# Patient Record
Sex: Male | Born: 1955 | Race: White | Hispanic: No | Marital: Married | Smoking: Former smoker
Health system: Southern US, Community
[De-identification: ages and names within clinical notes are randomized; demographics above are authoritative.]

## PROBLEM LIST (undated history)

## (undated) DIAGNOSIS — I10 Essential (primary) hypertension: Secondary | ICD-10-CM

## (undated) DIAGNOSIS — R109 Unspecified abdominal pain: Secondary | ICD-10-CM

## (undated) DIAGNOSIS — I2510 Atherosclerotic heart disease of native coronary artery without angina pectoris: Secondary | ICD-10-CM

## (undated) DIAGNOSIS — K5090 Crohn's disease, unspecified, without complications: Secondary | ICD-10-CM

## (undated) DIAGNOSIS — N289 Disorder of kidney and ureter, unspecified: Secondary | ICD-10-CM

## (undated) DIAGNOSIS — N210 Calculus in bladder: Secondary | ICD-10-CM

## (undated) DIAGNOSIS — R519 Headache, unspecified: Secondary | ICD-10-CM

## (undated) DIAGNOSIS — M1990 Unspecified osteoarthritis, unspecified site: Secondary | ICD-10-CM

## (undated) DIAGNOSIS — E785 Hyperlipidemia, unspecified: Secondary | ICD-10-CM

## (undated) DIAGNOSIS — R51 Headache: Secondary | ICD-10-CM

## (undated) HISTORY — DX: Essential (primary) hypertension: I10

## (undated) HISTORY — DX: Atherosclerotic heart disease of native coronary artery without angina pectoris: I25.10

## (undated) HISTORY — PX: BACK SURGERY: SHX140

## (undated) HISTORY — DX: Headache: R51

## (undated) HISTORY — PX: CORONARY STENT PLACEMENT: SHX1402

## (undated) HISTORY — PX: ESOPHAGOGASTRODUODENOSCOPY ENDOSCOPY: SHX5814

## (undated) HISTORY — DX: Hyperlipidemia, unspecified: E78.5

## (undated) HISTORY — DX: Headache, unspecified: R51.9

## (undated) HISTORY — PX: KNEE ARTHROSCOPY: SUR90

## (undated) HISTORY — PX: CHOLECYSTECTOMY: SHX55

---

## 1999-11-10 ENCOUNTER — Encounter: Payer: Self-pay | Admitting: Neurological Surgery

## 1999-11-11 ENCOUNTER — Encounter (INDEPENDENT_AMBULATORY_CARE_PROVIDER_SITE_OTHER): Payer: Self-pay | Admitting: *Deleted

## 1999-11-11 ENCOUNTER — Ambulatory Visit (HOSPITAL_COMMUNITY): Admission: RE | Admit: 1999-11-11 | Discharge: 1999-11-11 | Payer: Self-pay | Admitting: Gastroenterology

## 1999-12-02 ENCOUNTER — Ambulatory Visit (HOSPITAL_COMMUNITY): Admission: RE | Admit: 1999-12-02 | Discharge: 1999-12-02 | Payer: Self-pay | Admitting: Neurological Surgery

## 1999-12-02 ENCOUNTER — Encounter: Payer: Self-pay | Admitting: Neurological Surgery

## 1999-12-31 ENCOUNTER — Emergency Department (HOSPITAL_COMMUNITY): Admission: EM | Admit: 1999-12-31 | Discharge: 1999-12-31 | Payer: Self-pay | Admitting: Emergency Medicine

## 2000-01-12 ENCOUNTER — Encounter: Payer: Self-pay | Admitting: Neurological Surgery

## 2000-01-12 ENCOUNTER — Ambulatory Visit (HOSPITAL_COMMUNITY): Admission: RE | Admit: 2000-01-12 | Discharge: 2000-01-12 | Payer: Self-pay | Admitting: Neurological Surgery

## 2000-01-17 ENCOUNTER — Encounter: Payer: Self-pay | Admitting: Neurological Surgery

## 2000-01-17 ENCOUNTER — Ambulatory Visit (HOSPITAL_COMMUNITY): Admission: RE | Admit: 2000-01-17 | Discharge: 2000-01-17 | Payer: Self-pay | Admitting: Neurological Surgery

## 2000-09-15 ENCOUNTER — Encounter: Admission: RE | Admit: 2000-09-15 | Discharge: 2000-09-15 | Payer: Self-pay | Admitting: *Deleted

## 2000-09-15 ENCOUNTER — Encounter: Payer: Self-pay | Admitting: Nurse Practitioner

## 2000-11-12 ENCOUNTER — Ambulatory Visit (HOSPITAL_COMMUNITY): Admission: RE | Admit: 2000-11-12 | Discharge: 2000-11-12 | Payer: Self-pay | Admitting: Neurological Surgery

## 2000-11-12 ENCOUNTER — Encounter: Payer: Self-pay | Admitting: Neurological Surgery

## 2000-12-07 ENCOUNTER — Encounter: Payer: Self-pay | Admitting: Neurological Surgery

## 2000-12-07 ENCOUNTER — Inpatient Hospital Stay (HOSPITAL_COMMUNITY): Admission: RE | Admit: 2000-12-07 | Discharge: 2000-12-10 | Payer: Self-pay | Admitting: Neurological Surgery

## 2000-12-27 ENCOUNTER — Encounter: Admission: RE | Admit: 2000-12-27 | Discharge: 2000-12-27 | Payer: Self-pay | Admitting: Neurological Surgery

## 2000-12-27 ENCOUNTER — Encounter: Payer: Self-pay | Admitting: Neurological Surgery

## 2001-01-22 ENCOUNTER — Encounter: Admission: RE | Admit: 2001-01-22 | Discharge: 2001-01-22 | Payer: Self-pay | Admitting: Neurological Surgery

## 2001-01-22 ENCOUNTER — Encounter: Payer: Self-pay | Admitting: Neurological Surgery

## 2001-03-07 ENCOUNTER — Encounter: Admission: RE | Admit: 2001-03-07 | Discharge: 2001-03-07 | Payer: Self-pay | Admitting: Neurological Surgery

## 2001-03-07 ENCOUNTER — Encounter: Payer: Self-pay | Admitting: Neurological Surgery

## 2001-03-12 ENCOUNTER — Encounter: Admission: RE | Admit: 2001-03-12 | Discharge: 2001-05-02 | Payer: Self-pay | Admitting: Neurological Surgery

## 2001-03-13 ENCOUNTER — Encounter: Admission: RE | Admit: 2001-03-13 | Discharge: 2001-03-13 | Payer: Self-pay | Admitting: Urology

## 2001-03-13 ENCOUNTER — Encounter: Payer: Self-pay | Admitting: Urology

## 2001-03-30 ENCOUNTER — Other Ambulatory Visit: Admission: RE | Admit: 2001-03-30 | Discharge: 2001-03-30 | Payer: Self-pay | Admitting: Urology

## 2001-04-03 ENCOUNTER — Encounter: Admission: RE | Admit: 2001-04-03 | Discharge: 2001-06-08 | Payer: Self-pay | Admitting: Neurological Surgery

## 2001-04-13 ENCOUNTER — Other Ambulatory Visit: Admission: RE | Admit: 2001-04-13 | Discharge: 2001-04-13 | Payer: Self-pay | Admitting: Urology

## 2001-05-01 ENCOUNTER — Encounter: Payer: Self-pay | Admitting: Urology

## 2001-05-01 ENCOUNTER — Encounter (INDEPENDENT_AMBULATORY_CARE_PROVIDER_SITE_OTHER): Payer: Self-pay

## 2001-05-01 ENCOUNTER — Ambulatory Visit (HOSPITAL_COMMUNITY): Admission: RE | Admit: 2001-05-01 | Discharge: 2001-05-01 | Payer: Self-pay | Admitting: Urology

## 2001-05-03 ENCOUNTER — Encounter: Admission: RE | Admit: 2001-05-03 | Discharge: 2001-06-08 | Payer: Self-pay | Admitting: Neurological Surgery

## 2001-07-31 ENCOUNTER — Encounter: Payer: Self-pay | Admitting: Neurological Surgery

## 2001-07-31 ENCOUNTER — Ambulatory Visit (HOSPITAL_COMMUNITY): Admission: RE | Admit: 2001-07-31 | Discharge: 2001-07-31 | Payer: Self-pay | Admitting: Neurological Surgery

## 2001-09-14 ENCOUNTER — Encounter: Admission: RE | Admit: 2001-09-14 | Discharge: 2001-09-14 | Payer: Self-pay | Admitting: Family Medicine

## 2001-09-14 ENCOUNTER — Encounter: Payer: Self-pay | Admitting: Family Medicine

## 2003-01-20 ENCOUNTER — Inpatient Hospital Stay (HOSPITAL_COMMUNITY): Admission: RE | Admit: 2003-01-20 | Discharge: 2003-01-21 | Payer: Self-pay | Admitting: Neurological Surgery

## 2003-01-20 ENCOUNTER — Encounter: Payer: Self-pay | Admitting: Neurological Surgery

## 2003-09-04 ENCOUNTER — Encounter: Payer: Self-pay | Admitting: Nurse Practitioner

## 2003-10-14 ENCOUNTER — Encounter: Admission: RE | Admit: 2003-10-14 | Discharge: 2003-10-14 | Payer: Self-pay | Admitting: Neurological Surgery

## 2003-11-07 ENCOUNTER — Ambulatory Visit (HOSPITAL_COMMUNITY): Admission: RE | Admit: 2003-11-07 | Discharge: 2003-11-07 | Payer: Self-pay | Admitting: Neurological Surgery

## 2003-11-13 ENCOUNTER — Inpatient Hospital Stay (HOSPITAL_COMMUNITY): Admission: RE | Admit: 2003-11-13 | Discharge: 2003-11-20 | Payer: Self-pay | Admitting: Neurological Surgery

## 2003-11-28 ENCOUNTER — Inpatient Hospital Stay (HOSPITAL_COMMUNITY): Admission: AD | Admit: 2003-11-28 | Discharge: 2003-12-02 | Payer: Self-pay | Admitting: Neurological Surgery

## 2003-12-26 ENCOUNTER — Inpatient Hospital Stay (HOSPITAL_COMMUNITY): Admission: EM | Admit: 2003-12-26 | Discharge: 2003-12-27 | Payer: Self-pay | Admitting: Emergency Medicine

## 2003-12-26 ENCOUNTER — Encounter (INDEPENDENT_AMBULATORY_CARE_PROVIDER_SITE_OTHER): Payer: Self-pay | Admitting: *Deleted

## 2004-02-17 ENCOUNTER — Encounter: Admission: RE | Admit: 2004-02-17 | Discharge: 2004-02-17 | Payer: Self-pay | Admitting: Neurological Surgery

## 2004-02-23 ENCOUNTER — Encounter (HOSPITAL_COMMUNITY): Admission: RE | Admit: 2004-02-23 | Discharge: 2004-03-24 | Payer: Self-pay | Admitting: Neurological Surgery

## 2004-03-05 ENCOUNTER — Ambulatory Visit (HOSPITAL_COMMUNITY): Admission: RE | Admit: 2004-03-05 | Discharge: 2004-03-05 | Payer: Self-pay | Admitting: Neurological Surgery

## 2004-03-11 ENCOUNTER — Inpatient Hospital Stay (HOSPITAL_COMMUNITY): Admission: RE | Admit: 2004-03-11 | Discharge: 2004-03-15 | Payer: Self-pay | Admitting: Neurological Surgery

## 2004-03-12 ENCOUNTER — Encounter (INDEPENDENT_AMBULATORY_CARE_PROVIDER_SITE_OTHER): Payer: Self-pay | Admitting: *Deleted

## 2004-03-29 ENCOUNTER — Encounter (INDEPENDENT_AMBULATORY_CARE_PROVIDER_SITE_OTHER): Payer: Self-pay | Admitting: *Deleted

## 2004-03-30 ENCOUNTER — Ambulatory Visit (HOSPITAL_COMMUNITY): Admission: RE | Admit: 2004-03-30 | Discharge: 2004-03-30 | Payer: Self-pay | Admitting: Gastroenterology

## 2004-04-16 ENCOUNTER — Encounter (HOSPITAL_COMMUNITY): Admission: RE | Admit: 2004-04-16 | Discharge: 2004-05-16 | Payer: Self-pay | Admitting: Neurological Surgery

## 2004-09-08 ENCOUNTER — Encounter: Payer: Self-pay | Admitting: Nurse Practitioner

## 2005-06-08 ENCOUNTER — Ambulatory Visit: Payer: Self-pay | Admitting: Gastroenterology

## 2005-06-21 ENCOUNTER — Ambulatory Visit: Payer: Self-pay | Admitting: Gastroenterology

## 2005-07-01 ENCOUNTER — Ambulatory Visit (HOSPITAL_COMMUNITY): Admission: RE | Admit: 2005-07-01 | Discharge: 2005-07-01 | Payer: Self-pay | Admitting: Neurological Surgery

## 2006-02-07 ENCOUNTER — Ambulatory Visit (HOSPITAL_COMMUNITY): Admission: RE | Admit: 2006-02-07 | Discharge: 2006-02-07 | Payer: Self-pay | Admitting: Neurological Surgery

## 2006-02-14 ENCOUNTER — Ambulatory Visit (HOSPITAL_COMMUNITY): Admission: RE | Admit: 2006-02-14 | Discharge: 2006-02-14 | Payer: Self-pay | Admitting: Neurological Surgery

## 2006-03-15 ENCOUNTER — Encounter: Admission: RE | Admit: 2006-03-15 | Discharge: 2006-03-15 | Payer: Self-pay | Admitting: Neurological Surgery

## 2006-03-22 ENCOUNTER — Encounter (HOSPITAL_COMMUNITY): Admission: RE | Admit: 2006-03-22 | Discharge: 2006-04-21 | Payer: Self-pay | Admitting: Neurological Surgery

## 2006-05-25 ENCOUNTER — Encounter: Admission: RE | Admit: 2006-05-25 | Discharge: 2006-05-25 | Payer: Self-pay | Admitting: Neurological Surgery

## 2007-01-05 ENCOUNTER — Ambulatory Visit (HOSPITAL_COMMUNITY): Admission: RE | Admit: 2007-01-05 | Discharge: 2007-01-05 | Payer: Self-pay | Admitting: Neurological Surgery

## 2007-08-05 ENCOUNTER — Emergency Department (HOSPITAL_COMMUNITY): Admission: EM | Admit: 2007-08-05 | Discharge: 2007-08-05 | Payer: Self-pay | Admitting: Emergency Medicine

## 2007-08-21 ENCOUNTER — Encounter: Payer: Self-pay | Admitting: Nurse Practitioner

## 2007-10-25 ENCOUNTER — Encounter: Admission: RE | Admit: 2007-10-25 | Discharge: 2007-10-25 | Payer: Self-pay | Admitting: Neurological Surgery

## 2008-05-15 ENCOUNTER — Encounter: Admission: RE | Admit: 2008-05-15 | Discharge: 2008-05-15 | Payer: Self-pay | Admitting: Neurological Surgery

## 2008-09-25 ENCOUNTER — Encounter: Payer: Self-pay | Admitting: Nurse Practitioner

## 2009-05-11 ENCOUNTER — Emergency Department (HOSPITAL_COMMUNITY): Admission: EM | Admit: 2009-05-11 | Discharge: 2009-05-12 | Payer: Self-pay | Admitting: Emergency Medicine

## 2009-05-12 ENCOUNTER — Telehealth: Payer: Self-pay | Admitting: Internal Medicine

## 2009-05-13 ENCOUNTER — Ambulatory Visit: Payer: Self-pay | Admitting: Gastroenterology

## 2009-05-13 DIAGNOSIS — D696 Thrombocytopenia, unspecified: Secondary | ICD-10-CM | POA: Insufficient documentation

## 2009-05-13 DIAGNOSIS — R110 Nausea: Secondary | ICD-10-CM | POA: Insufficient documentation

## 2009-05-13 DIAGNOSIS — N200 Calculus of kidney: Secondary | ICD-10-CM | POA: Insufficient documentation

## 2009-05-13 DIAGNOSIS — R1013 Epigastric pain: Secondary | ICD-10-CM | POA: Insufficient documentation

## 2009-05-14 ENCOUNTER — Ambulatory Visit (HOSPITAL_COMMUNITY): Admission: RE | Admit: 2009-05-14 | Discharge: 2009-05-14 | Payer: Self-pay | Admitting: Gastroenterology

## 2009-05-14 LAB — CONVERTED CEMR LAB
AST: 328 units/L — ABNORMAL HIGH (ref 0–37)
Total Bilirubin: 1.8 mg/dL — ABNORMAL HIGH (ref 0.3–1.2)

## 2009-05-15 ENCOUNTER — Encounter: Payer: Self-pay | Admitting: Nurse Practitioner

## 2009-05-15 ENCOUNTER — Encounter: Payer: Self-pay | Admitting: Gastroenterology

## 2009-05-15 DIAGNOSIS — K8050 Calculus of bile duct without cholangitis or cholecystitis without obstruction: Secondary | ICD-10-CM | POA: Insufficient documentation

## 2009-05-17 ENCOUNTER — Ambulatory Visit (HOSPITAL_COMMUNITY): Admission: RE | Admit: 2009-05-17 | Discharge: 2009-05-17 | Payer: Self-pay | Admitting: Gastroenterology

## 2009-05-18 ENCOUNTER — Ambulatory Visit: Payer: Self-pay | Admitting: Gastroenterology

## 2009-05-18 LAB — CONVERTED CEMR LAB
Basophils Relative: 0.1 % (ref 0.0–3.0)
Eosinophils Relative: 2.3 % (ref 0.0–5.0)
HCT: 39.5 % (ref 39.0–52.0)
Hemoglobin: 13.6 g/dL (ref 13.0–17.0)
Lipase: 12 units/L (ref 11.0–59.0)
Lymphs Abs: 1 10*3/uL (ref 0.7–4.0)
MCV: 94.5 fL (ref 78.0–100.0)
Monocytes Absolute: 0.3 10*3/uL (ref 0.1–1.0)
Monocytes Relative: 5.6 % (ref 3.0–12.0)
Neutro Abs: 4.5 10*3/uL (ref 1.4–7.7)
Platelets: 152 10*3/uL (ref 150.0–400.0)
Prothrombin Time: 10.9 s (ref 9.1–11.7)
WBC: 5.9 10*3/uL (ref 4.5–10.5)

## 2009-05-19 ENCOUNTER — Encounter: Payer: Self-pay | Admitting: Gastroenterology

## 2009-05-19 ENCOUNTER — Ambulatory Visit (HOSPITAL_COMMUNITY): Admission: RE | Admit: 2009-05-19 | Discharge: 2009-05-19 | Payer: Self-pay | Admitting: Gastroenterology

## 2009-05-19 ENCOUNTER — Telehealth: Payer: Self-pay | Admitting: Gastroenterology

## 2009-05-21 ENCOUNTER — Ambulatory Visit: Payer: Self-pay | Admitting: Gastroenterology

## 2009-05-25 ENCOUNTER — Telehealth: Payer: Self-pay | Admitting: Gastroenterology

## 2009-06-01 ENCOUNTER — Encounter: Payer: Self-pay | Admitting: Gastroenterology

## 2009-06-02 ENCOUNTER — Telehealth: Payer: Self-pay | Admitting: Gastroenterology

## 2009-06-03 ENCOUNTER — Telehealth: Payer: Self-pay | Admitting: Internal Medicine

## 2009-06-19 ENCOUNTER — Ambulatory Visit: Payer: Self-pay | Admitting: Internal Medicine

## 2009-06-22 LAB — CONVERTED CEMR LAB
ALT: 22 units/L (ref 0–53)
AST: 23 units/L (ref 0–37)
Alkaline Phosphatase: 59 units/L (ref 39–117)
Bilirubin, Direct: 0.1 mg/dL (ref 0.0–0.3)
Total Bilirubin: 1.1 mg/dL (ref 0.3–1.2)

## 2009-07-10 ENCOUNTER — Ambulatory Visit (HOSPITAL_BASED_OUTPATIENT_CLINIC_OR_DEPARTMENT_OTHER): Admission: RE | Admit: 2009-07-10 | Discharge: 2009-07-10 | Payer: Self-pay | Admitting: Urology

## 2009-08-10 ENCOUNTER — Ambulatory Visit: Payer: Self-pay | Admitting: Gastroenterology

## 2009-08-10 DIAGNOSIS — K219 Gastro-esophageal reflux disease without esophagitis: Secondary | ICD-10-CM | POA: Insufficient documentation

## 2009-08-10 DIAGNOSIS — Z860100 Personal history of colon polyps, unspecified: Secondary | ICD-10-CM | POA: Insufficient documentation

## 2009-08-10 DIAGNOSIS — R1011 Right upper quadrant pain: Secondary | ICD-10-CM | POA: Insufficient documentation

## 2009-08-10 DIAGNOSIS — R932 Abnormal findings on diagnostic imaging of liver and biliary tract: Secondary | ICD-10-CM | POA: Insufficient documentation

## 2009-08-11 ENCOUNTER — Ambulatory Visit: Payer: Self-pay | Admitting: Cardiology

## 2009-08-11 LAB — CONVERTED CEMR LAB
Alkaline Phosphatase: 66 units/L (ref 39–117)
BUN: 11 mg/dL (ref 6–23)
Basophils Relative: 0.2 % (ref 0.0–3.0)
Bilirubin Urine: NEGATIVE
CO2: 30 meq/L (ref 19–32)
Eosinophils Relative: 2 % (ref 0.0–5.0)
GFR calc non Af Amer: 73.97 mL/min (ref >60)
Glucose, Bld: 89 mg/dL (ref 70–99)
HCT: 44.1 % (ref 39.0–52.0)
Leukocytes, UA: NEGATIVE
Lipase: 20 units/L (ref 11.0–59.0)
Lymphs Abs: 1.4 10*3/uL (ref 0.7–4.0)
MCV: 93.8 fL (ref 78.0–100.0)
Monocytes Absolute: 0.4 10*3/uL (ref 0.1–1.0)
Monocytes Relative: 4.9 % (ref 3.0–12.0)
Neutrophils Relative %: 75 % (ref 43.0–77.0)
Nitrite: NEGATIVE
Platelets: 126 10*3/uL — ABNORMAL LOW (ref 150.0–400.0)
RBC: 4.71 M/uL (ref 4.22–5.81)
Specific Gravity, Urine: 1.01 (ref 1.000–1.030)
Total Bilirubin: 1 mg/dL (ref 0.3–1.2)
Total Protein, Urine: NEGATIVE mg/dL
Total Protein: 7.1 g/dL (ref 6.0–8.3)
WBC: 7.6 10*3/uL (ref 4.5–10.5)
pH: 6 (ref 5.0–8.0)

## 2009-09-14 ENCOUNTER — Encounter: Payer: Self-pay | Admitting: Gastroenterology

## 2009-11-09 ENCOUNTER — Telehealth: Payer: Self-pay | Admitting: Gastroenterology

## 2009-11-10 ENCOUNTER — Ambulatory Visit: Payer: Self-pay | Admitting: Gastroenterology

## 2009-11-11 LAB — CONVERTED CEMR LAB
Basophils Absolute: 0.1 10*3/uL (ref 0.0–0.1)
Eosinophils Relative: 2.3 % (ref 0.0–5.0)
Lymphocytes Relative: 26.8 % (ref 12.0–46.0)
Monocytes Relative: 8.6 % (ref 3.0–12.0)
Neutrophils Relative %: 61.1 % (ref 43.0–77.0)
Platelets: 151 10*3/uL (ref 150.0–400.0)
RDW: 13.7 % (ref 11.5–14.6)
WBC: 6.3 10*3/uL (ref 4.5–10.5)

## 2009-11-15 ENCOUNTER — Encounter: Admission: RE | Admit: 2009-11-15 | Discharge: 2009-11-15 | Payer: Self-pay | Admitting: Gastroenterology

## 2009-11-20 ENCOUNTER — Encounter: Payer: Self-pay | Admitting: Gastroenterology

## 2009-11-23 ENCOUNTER — Telehealth: Payer: Self-pay | Admitting: Gastroenterology

## 2009-11-24 ENCOUNTER — Ambulatory Visit (HOSPITAL_COMMUNITY): Admission: RE | Admit: 2009-11-24 | Discharge: 2009-11-24 | Payer: Self-pay | Admitting: Gastroenterology

## 2009-11-24 ENCOUNTER — Ambulatory Visit: Payer: Self-pay | Admitting: Gastroenterology

## 2009-11-25 ENCOUNTER — Encounter (INDEPENDENT_AMBULATORY_CARE_PROVIDER_SITE_OTHER): Payer: Self-pay | Admitting: *Deleted

## 2009-11-25 LAB — CONVERTED CEMR LAB
AST: 54 units/L — ABNORMAL HIGH (ref 0–37)
Alkaline Phosphatase: 116 units/L (ref 39–117)
Basophils Relative: 0 % (ref 0.0–3.0)
Bilirubin, Direct: 0.2 mg/dL (ref 0.0–0.3)
CO2: 31 meq/L (ref 19–32)
Calcium: 9.5 mg/dL (ref 8.4–10.5)
Chloride: 104 meq/L (ref 96–112)
Eosinophils Relative: 1.4 % (ref 0.0–5.0)
Glucose, Bld: 104 mg/dL — ABNORMAL HIGH (ref 70–99)
Hemoglobin: 13.8 g/dL (ref 13.0–17.0)
Ketones, ur: 40 mg/dL
Leukocytes, UA: NEGATIVE
Lipase: 17 units/L (ref 11.0–59.0)
Lymphocytes Relative: 13 % (ref 12.0–46.0)
MCHC: 33.7 g/dL (ref 30.0–36.0)
Monocytes Absolute: 0.5 10*3/uL (ref 0.1–1.0)
Nitrite: NEGATIVE
Platelets: 151 10*3/uL (ref 150.0–400.0)
Potassium: 4.2 meq/L (ref 3.5–5.1)
RDW: 13.5 % (ref 11.5–14.6)
Sodium: 141 meq/L (ref 135–145)
Specific Gravity, Urine: 1.02 (ref 1.000–1.030)
Total Protein: 7.1 g/dL (ref 6.0–8.3)
Urobilinogen, UA: 0.2 (ref 0.0–1.0)
pH: 6 (ref 5.0–8.0)

## 2009-11-27 ENCOUNTER — Ambulatory Visit: Payer: Self-pay | Admitting: Gastroenterology

## 2009-11-30 ENCOUNTER — Encounter: Payer: Self-pay | Admitting: Gastroenterology

## 2009-11-30 ENCOUNTER — Telehealth: Payer: Self-pay | Admitting: Gastroenterology

## 2009-12-09 ENCOUNTER — Telehealth (INDEPENDENT_AMBULATORY_CARE_PROVIDER_SITE_OTHER): Payer: Self-pay | Admitting: *Deleted

## 2009-12-11 ENCOUNTER — Encounter: Payer: Self-pay | Admitting: Gastroenterology

## 2010-01-05 ENCOUNTER — Telehealth: Payer: Self-pay | Admitting: Gastroenterology

## 2010-01-05 ENCOUNTER — Ambulatory Visit: Payer: Self-pay | Admitting: Gastroenterology

## 2010-01-06 LAB — CONVERTED CEMR LAB
Albumin: 4 g/dL (ref 3.5–5.2)
Alkaline Phosphatase: 67 units/L (ref 39–117)
Total Protein: 6.1 g/dL (ref 6.0–8.3)

## 2010-02-09 ENCOUNTER — Telehealth: Payer: Self-pay | Admitting: Gastroenterology

## 2010-03-08 ENCOUNTER — Ambulatory Visit: Payer: Self-pay | Admitting: Gastroenterology

## 2010-03-12 ENCOUNTER — Ambulatory Visit (HOSPITAL_COMMUNITY): Admission: RE | Admit: 2010-03-12 | Discharge: 2010-03-12 | Payer: Self-pay | Admitting: Neurological Surgery

## 2010-04-01 ENCOUNTER — Encounter: Admission: RE | Admit: 2010-04-01 | Discharge: 2010-04-01 | Payer: Self-pay | Admitting: Neurological Surgery

## 2010-05-13 ENCOUNTER — Encounter (INDEPENDENT_AMBULATORY_CARE_PROVIDER_SITE_OTHER): Payer: Self-pay | Admitting: *Deleted

## 2010-10-20 ENCOUNTER — Telehealth: Payer: Self-pay | Admitting: Gastroenterology

## 2010-10-24 ENCOUNTER — Encounter: Payer: Self-pay | Admitting: Neurological Surgery

## 2010-11-02 ENCOUNTER — Encounter: Payer: Self-pay | Admitting: Gastroenterology

## 2010-11-02 ENCOUNTER — Other Ambulatory Visit: Payer: Self-pay | Admitting: Gastroenterology

## 2010-11-02 ENCOUNTER — Ambulatory Visit
Admission: RE | Admit: 2010-11-02 | Discharge: 2010-11-02 | Payer: Self-pay | Source: Home / Self Care | Attending: Gastroenterology | Admitting: Gastroenterology

## 2010-11-02 DIAGNOSIS — R141 Gas pain: Secondary | ICD-10-CM | POA: Insufficient documentation

## 2010-11-02 DIAGNOSIS — K5900 Constipation, unspecified: Secondary | ICD-10-CM | POA: Insufficient documentation

## 2010-11-02 DIAGNOSIS — K649 Unspecified hemorrhoids: Secondary | ICD-10-CM | POA: Insufficient documentation

## 2010-11-02 DIAGNOSIS — R142 Eructation: Secondary | ICD-10-CM

## 2010-11-02 DIAGNOSIS — J328 Other chronic sinusitis: Secondary | ICD-10-CM | POA: Insufficient documentation

## 2010-11-02 DIAGNOSIS — R1319 Other dysphagia: Secondary | ICD-10-CM | POA: Insufficient documentation

## 2010-11-02 DIAGNOSIS — IMO0001 Reserved for inherently not codable concepts without codable children: Secondary | ICD-10-CM

## 2010-11-02 DIAGNOSIS — R143 Flatulence: Secondary | ICD-10-CM | POA: Insufficient documentation

## 2010-11-02 LAB — CBC WITH DIFFERENTIAL/PLATELET
Basophils Absolute: 0 10*3/uL (ref 0.0–0.1)
Basophils Relative: 0.2 % (ref 0.0–3.0)
Eosinophils Relative: 2.6 % (ref 0.0–5.0)
HCT: 43.6 % (ref 39.0–52.0)
Hemoglobin: 14.9 g/dL (ref 13.0–17.0)
Lymphocytes Relative: 23.2 % (ref 12.0–46.0)
Lymphs Abs: 1.8 10*3/uL (ref 0.7–4.0)
Monocytes Relative: 6.2 % (ref 3.0–12.0)
Neutro Abs: 5.2 10*3/uL (ref 1.4–7.7)
RBC: 4.84 Mil/uL (ref 4.22–5.81)
WBC: 7.6 10*3/uL (ref 4.5–10.5)

## 2010-11-02 LAB — BASIC METABOLIC PANEL
BUN: 13 mg/dL (ref 6–23)
Calcium: 9.4 mg/dL (ref 8.4–10.5)
Chloride: 104 mEq/L (ref 96–112)
Creatinine, Ser: 1.1 mg/dL (ref 0.4–1.5)

## 2010-11-02 LAB — HEPATIC FUNCTION PANEL
AST: 19 U/L (ref 0–37)
Albumin: 4.2 g/dL (ref 3.5–5.2)
Alkaline Phosphatase: 68 U/L (ref 39–117)
Bilirubin, Direct: 0.1 mg/dL (ref 0.0–0.3)
Total Protein: 6.8 g/dL (ref 6.0–8.3)

## 2010-11-02 LAB — LIPASE: Lipase: 32 U/L (ref 11.0–59.0)

## 2010-11-02 NOTE — Progress Notes (Signed)
Summary: Having same problem again  Phone Note Call from Patient Call back at Home Phone [phone]   Call For: Dr Russella Dar Reason for Call: Talk to Nurse Summary of Call: Is sure you will remember him. Says he is having the same problem again.  Initial call taken by: Leanor Kail [facility],  Feb 09, 2010 12:36 PM  Follow-up for Phone Call        Patient  reports Sunday pm about 8 pm severe pain returned with the sweats.  He took Maalox and this helped some he also took some hyomax.  He has been out of Carafate for about 1 week and glycopyrolate.  He reports that he now has a burning sensation in his esophagus and throat.  He has been taking pantoprazole two times a day.  Should he restart Carafate and Glycopyrolate.  He is not sure if it is just a coincidence that the pain returned.  Please advise Follow-up by: [name] Jones RN, CGRN,  Feb 09, 2010 1:16 PM  Additional Follow-up for Phone Call Additional follow up Details #1::        Restart Carafate and glycopyrrolate. Continue pantoprazole bid. Please have his take his temperature when he is hot or having sweats to check for a fever. Have him come in for bloodwork: CMET, CBC, lipase. Additional Follow-up by: Skylur Fuston T Johnavon Mcclafferty MD FACG,  Feb 09, 2010 3:01 PM    Additional Follow-up for Phone Call Additional follow up Details #2::    I have left the patient a message asking him to pick up RX at the pharmacy and start on it agin, he is also asked to take his temp when he has a hot episode, and come for labs today or in the am.  I have also asked him to call me back to confirm he got this message.  He was going to [facility] with his wife earlier when we spoke. Follow-up by: [name] Jones RN, CGRN,  Feb 09, 2010 3:17 PM  Additional Follow-up for Phone Call Additional follow up Details #3:: Details for Additional Follow-up Action Taken: I spoke with patient this am .  He will try to come for labs today Additional Follow-up by: [name] Jones RN, CGRN,   Feb 10, 2010 10:46 AM  Prescriptions: CARAFATE 1 GM  TABS (SUCRALFATE) by mouth three times a day  #90 x 3   Entered by:   [name] Jones RN, CGRN   Authorized by:   Marrian Bells T Yavonne Kiss MD FACG   Signed by:   [name] Jones RN, CGRN on 02/09/2010   Method used:   Electronically to        [id number] (retail)       [address]       [place]       [place], [place]  [address]       Ph: [phone] or [phone]       Fax: [phone]   RxID:   [id number] GLYCOPYRROLATE 2 MG TABS (GLYCOPYRROLATE) one tablet by mouth two times a day  #60 x 3   Entered by:   [name] Jones RN, CGRN   Authorized by:   Shonette Rhames T Breannah Kratt MD FACG   Signed by:   [name] Jones RN, CGRN on 02/09/2010   Method used:   Electronically to        [id number] (retail)       [address]       [place]  [place], [place]  [address]       Ph: [phone] or [phone]       Fax: [phone]   RxID:   [id number]

## 2010-11-02 NOTE — Procedures (Signed)
Summary: EGD/[facility]  EGD/[facility]   Imported By: Sherian Rein 11/24/2009 15:11:38  _____________________________________________________________________  External Attachment:    Type:   Image     Comment:   External Document

## 2010-11-02 NOTE — Letter (Signed)
Summary: Patient Surgery By [facility] Biopsy Results   Gastroenterology  [address] [place], [place] [address]   Phone: [phone]  Fax: [phone]        November 30, 2009 MRN: [id number]    [facility] [address] [place], [place]  [address]    Dear Mr. Kamen,  I am pleased to inform you that the biopsies taken during your recent endoscopic examination did not show any evidence of cancer upon pathologic examination. The biopsies showed mild gastritis.  Continue with the treatment plan as outlined on the day of your      exam.  Please call us if you are having persistent problems or have questions about your condition that have not been fully answered at this time.  Sincerely,  Meryl Dare MD [facility]  This letter has been electronically signed by your physician.  Appended Document: Patient Notice-Endo Biopsy Results letter mailed 3.3.11

## 2010-11-02 NOTE — Procedures (Signed)
Summary: EUS/ [facility]   Imported By: Sherian Rein 11/12/2009 11:42:34  _____________________________________________________________________  External Attachment:    Type:   Image     Comment:   External Document

## 2010-11-02 NOTE — Progress Notes (Signed)
Summary: procedure advice and med ?  Phone Note Call from Patient Call back at Home Phone [phone]   Caller: Patient Call For: Dr. Russella Dar Reason for Call: Talk to Nurse Summary of Call: pt has EGD Friday and reporting that last night and today pt has been nauseated w/ low grade fever and breaking out in a sweat... pt wants to know what he should watch for as we get closer to Friday, if he needs to cancel, etc....  pt also has questions regarding some recent medication changes per Dr. Russella Dar... pt said about three different medications have been switched around and now he isnt sure if certain meds are to take the place of or be taken with others   Initial call taken by: Vallarie Mare,  November 30, 2009 8:42 AM  Follow-up for Phone Call        Patient states he has developed a fever yesterday of 100.8, this am 101.4.  No pain this am.  Denies joint or other body aches or sore throat.  He does c/o some abdominal "tightness" and mild bloating.  States he has been having "sweats all along", but this is the first time it has been associated with a fever.  Please advise Follow-up by: Darcey Nora RN, CGRN,  November 30, 2009 10:24 AM  Additional Follow-up for Phone Call Additional follow up Details #1::        Work in for office evaluation this week with [place] and his PCP. Have not located any potential source of fever from a GI standpoint.  Additional Follow-up by: Meryl Dare MD Clementeen Graham,  November 30, 2009 3:50 PM    Additional Follow-up for Phone Call Additional follow up Details #2::    Patient  will come see Willette Cluster RNP 12-01-09 10:30.  I have also asked him to schedule an appointment with his primary care MD Follow-up by: Darcey Nora RN, CGRN,  November 30, 2009 3:58 PM

## 2010-11-02 NOTE — Letter (Signed)
Summary: Colonoscopy Letter  [facility]  [address] [place], [place] [address]   Phone: [phone]  Fax: [phone]      May 13, 2010 MRN: [id number]   [facility] [address] [place], [place]  [address]   Dear Mr. Grenier,   According to your medical record, it is time for you to schedule a Colonoscopy. The American Cancer Society recommends this procedure as a method to detect early colon cancer. Patients with a family history of colon cancer, or a personal history of colon polyps or inflammatory bowel disease are at increased risk.  This letter has beeen generated based on the recommendations made at the time of your procedure. If you feel that in your particular situation this may no longer apply, please contact our office.  Please call our office at [phone] to schedule this appointment or to update your records at your earliest convenience.  Thank you for cooperating with [place] to provide you with the very best care possible.   Sincerely,  Malcom T. Russella Dar, M.D.  [facility] Gastroenterology Division [phone]

## 2010-11-02 NOTE — Progress Notes (Signed)
Summary: Records received from [facility] received from  [facility]. Records forwarded to Dr. Russella Dar for review. Amanda aware. Dena Chavis  December 09, 2009 12:51 PM

## 2010-11-02 NOTE — Assessment & Plan Note (Signed)
Summary: Gastroenterology  , Loys   MR#:  [id number] Page #    NAME:  Russell Padilla, Russell Padilla    OFFICE NO:  [id number]  DATE:  03/29/04  DOB:   2056/01/27  REFERRING PHYSICIAN:  The patient is referred by Dr. Dara Hoyer who is a primary care doctor.   HISTORY OF PRESENT ILLNESS:  He is complaining of acid reflux, followup since 6 months.  Food builds up in his throat and pressure develops.  His gallbladder was removed 3 months ago, and he says he is getting better.  He had abdominal pain prior to the gallbladder being removed.  He had a calculus cholecystitis and does get gas and bloating since.  He has had some diarrhea.  He has been taking some Colestid for this.  He has had some hemorrhoids as well with occasional bleeding and has a history of colon polyps.  He has taken the Protonix 1 every morning.  Patient says he has been doing well until recently.    PAST MEDICAL HISTORY:  His past medical history reveals hyperlipidemia, arthritis, and his cholecystectomy.   FAMILY HISTORY:  Is noncontributory except for diabetes.    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   REVIEW OF SYSTEMS:  Reveals some arthritis, back pain, and night sweats.   PHYSICAL EXAMINATION:  On physical examination, he is a very healthy-appearing gentleman.  Height 5 feet 11, weighs 188, blood pressure 124/74, pulse 74 and regular.  Neck, heart, and extremities are all unremarkable.  IMPRESSION:   1.  Gastroesophageal reflux disease with dysphagia of questionable etiology. 2.  Irritable bowel syndrome with diverticular component. 3.  Status post colorectal diarrhea. 4.  Multiple back surgeries. 5.  Hyperlipidemia.   RECOMMENDATIONS:  Is that he be scheduled for an upper endoscopy with dilatation pending the findings on a barium swallow with a tablet.  He is status post colon polyps, and we need to obtain a colonoscopy examination some time on  him as well at the same time.  He should be on NuLev in the meantime.  I think he should do well.        Ulyess Mort, M.D.  [id number] D:  03/29/04; T:  ; Job [phone]

## 2010-11-02 NOTE — Procedures (Signed)
Summary: Upper Endoscopy  Patient: [name] Note: All result statuses are Final unless otherwise noted.  Tests: (1) Upper Endoscopy (EGD)   EGD Upper Endoscopy       DONE     [facility]     [address]     [place], [place]  [address]           ENDOSCOPY PROCEDURE REPORT           PATIENT:  Russell Padilla, Russell Padilla  MR#:  [id number]     BIRTHDATE:  [birth year], 53 yrs. old  GENDER:  male           ENDOSCOPIST:  Judie Petit T. Russella Dar, MD, [facility]           PROCEDURE DATE:  11/27/2009     PROCEDURE:  EGD with biopsy     ASA CLASS:  Class II     INDICATIONS:  abdominal pain, right upper quad           MEDICATIONS:  Fentanyl 75 mcg IV, Versed 7 mg IV     TOPICAL ANESTHETIC:  Exactacain Spray           DESCRIPTION OF PROCEDURE:   After the risks benefits and     alternatives of the procedure were thoroughly explained, informed     consent was obtained.  The LB GIF-H180 [id number] endoscope was     introduced through the mouth and advanced to the second portion of     the duodenum, without limitations.  The instrument was slowly     withdrawn as the mucosa was fully examined.     <<PROCEDUREIMAGES>>           Mild scattered exudates in the esophagus. They were yellow and     mild. Mild gastritis was found in the antrum and pylorus.     Erythema. Multiple biopsies were obtained and sent to pathology.     The duodenal bulb was normal in appearance, as was the postbulbar     duodenum.  Otherwise the examination was normal. Retroflexed views     revealed no abnormalities. The scope was then withdrawn from the     patient and the procedure completed.           COMPLICATIONS:  None           ENDOSCOPIC IMPRESSION:     1) Exudates in the esophagus-c/w candida     2) Mild gastritis in the antrum           RECOMMENDATIONS:     1) Await pathology results     2) OP follow-up in 2 weeks.     3) PPI bid: pantoprazole 40mg  po bie, #60, 5 refills     4) Diflucan 100mg  po qd, #7, no refills   5) Carafate 1g po tid, #90, 1 refill     6) Ibuprofen 200mg , 2-3 tablets tid prn abd pain     7) Etiology of pain is not clear, possible musculskeletal     8) Minimize and then DC oxycodone           Malcolm T. Russella Dar, MD, Clementeen Graham           CC:  Marjory Lies, MD           n.     Rosalie DoctorVenita Lick. Stark at 11/27/2009 10:09 AM           Russell Padilla, [id number]  Note: An exclamation mark (!) indicates  a result that was not dispersed into the flowsheet. Document Creation Date: 11/27/2009 10:09 AM _______________________________________________________________________  (1) Order result status: Final Collection or observation date-time: 11/27/2009 09:58 Requested date-time:  Receipt date-time:  Reported date-time:  Referring Physician:   Ordering Physician: Claudette Head [phone]) Specimen Source:  Source: Launa Grill Order Number: [id number] Lab site:

## 2010-11-02 NOTE — Progress Notes (Signed)
Summary: Triage  Phone Note Call from Patient Call back at Home Phone [phone]   Caller: Patient Call For: Dr. Russella Dar Reason for Call: Talk to Nurse Summary of Call: Pt is in [place] and had an emergency over the weekend. Would perfer to discuss it with you Initial call taken by: Karna Christmas,  November 23, 2009 8:31 AM  Follow-up for Phone Call        Patient  had 3 attacks of pain while in [place] this weekend.  Patient  says he went to the ER and was given percocet for pain.  He did not allow them to do any further x-rays.  Patient  is on his way back from [place] today.  He will come see Dr Russella Dar 11-24-09 tomorrow at 10:30.  He will bring the records from the ER.  Patient  has never seen Dr Leone Payor in the office, he perfers to remain with Dr Russella Dar.   Follow-up by: Darcey Nora RN, CGRN,  November 23, 2009 9:09 AM

## 2010-11-02 NOTE — Progress Notes (Signed)
Summary: Schedule MRCP  Phone Note Outgoing Call   Summary of Call: EUS dated 09/14/2009 sent to my attention from [facility] was reviewed and it shows no abnormalities. The distal CBD lesion noted on MRCP, ERCP and the first EUS was not seen on the last EUS. The case discussed with Dr. Lanell Matar at [facility] who recommends MRCP to further evaluate. I inquired about ERCP with cholangioscopy but Dr. Lanell Matar states that they do not offer this service at [facility]. I then determined that although I referred this pt to [facility] he is actually Dr. Marvell Fuller patient. Will order MRCP and discuss case with Dr. Leone Payor who will follow him. Above discussed with Lavonna Rua who will contact patient to have an MRCP to compare to the prior MRCP and ERCP. Initial call taken by: Meryl Dare MD Clementeen Graham,  November 09, 2009 3:15 PM  Follow-up for Phone Call        Left message for patient to call back Darcey Nora RN, [facility]  November 11, 2009 10:07 AM  Patient  scheduled at [facility] MRCP for 11-15-09 3:30.  he is instructed to be 4 hours NPO and to arrive at 3:00 Follow-up by: Darcey Nora RN, CGRN,  November 11, 2009 10:35 AM

## 2010-11-02 NOTE — Assessment & Plan Note (Signed)
Summary: abdominal pain/sheri   History of Present Illness Visit Type: Follow-up Visit Primary GI MD: Elie Goody MD [facility] Primary Provider: Marjory Lies, MD Requesting Provider: n/a Chief Complaint: abdominal pain upper rt. quad since Friday with severe sweats  seen in ER [place] History of Present Illness:   This is a 55 year old male, who relates a four-day history of severe right upper quadrant pain, sweats, chills, nausea, and anorexia. He presented with similar symptoms in August 2010 had had an apparent distal common bile duct lesion on imaging studies and ERCP. He had elevated liver function tests at that time. He underwent ERCP and sphincterotomy with a small CBD lesion noted in the distal common bile duct, biopsies of this lesion were negative.  Subsequent evaluation, including two endoscopic ultrasounds at [facility] and a repeat MRCP in [place] have not shown a distal common bile duct lesion or any other abnormality. I recently discussed this case with Dr. Lanell Matar at [facility] who recommended the recent MRCP. His liver function tests normalized. He had become totally asymptomatic following his hospitalization in August. 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 He was seen at [facility] emergency room in [facility], and apparently blood work was done and that was normal. No imaging studies were done as the patient stated he had recent evaluations [place]. I do not have the records from his ER evaluation. He states the RUQ pain and nausea are under fair control with oxycodone and promethazine. He denies shortness of breath or chest pain.   GI Review of Systems    Reports abdominal pain, loss of appetite, nausea, and  vomiting.     Location of  Abdominal pain: RUQ.    Denies acid reflux, belching, bloating, chest pain,  dysphagia with liquids, dysphagia with solids, heartburn, vomiting blood, weight loss, and  weight gain.        Denies anal fissure, black tarry stools, change in bowel habit, constipation, diarrhea, diverticulosis, fecal incontinence, heme positive stool, hemorrhoids, irritable bowel syndrome, jaundice, light color stool, liver problems, rectal bleeding, and  rectal pain.   Current Medications (verified): 1)  Benicar 40 Mg Tabs (Olmesartan Medoxomil) .... One Tablet By Mouth Once Daily 2)  Celebrex 200 Mg Caps (Celecoxib) .... One Tablet By Mouth Two Times A Day 3)  Bayer Aspirin Ec Low Dose 81 Mg Tbec (Aspirin) .... One Tablet By Mouth Once Daily 4)  Vitamin C 500 Mg Tabs (Ascorbic Acid) .... One Tablet By Mouth Two Times A Day 5)  Gnp Vitamin B-12 500 Mcg Tabs (Cyanocobalamin) .... One Tablet By Mouth Once Daily 6)  Centrum Silver  Tabs (Multiple Vitamins-Minerals) .... One Tablet By Mouth Once Daily 7)  Senna 8.6 Mg Tabs (Sennosides) .... One or Two Tabs By Mouth Daily 8)  Simvastatin 40 Mg Tabs (Simvastatin) .... One Tablet By Mouth Once Daily 9)  Pantoprazole Sodium 40 Mg  Tbec (Pantoprazole Sodium) .Marland Kitchen.. 1 Each Day 30 Minutes Before Meal 10)  Promethazine Hcl 25 Mg Tabs (Promethazine Hcl) .... One Tablet By Mouth Every 6 Hours As Needed 11)  Oxycodone-Acetaminophen 10-325 Mg Tabs (Oxycodone-Acetaminophen) .... One Tablet By Mouth Every 6 Hours As Needed  Allergies (verified): No Known Drug Allergies  Past History:  Past Medical History: Hyperlipidemia Kidney Stones Hypertension Adenomatous Colon Polyps 2001 Arthritis GERD Distal CBD lesion on MRCP and ERCP 05/12/2009,  bx negative  Past Surgical History: Left Knee Surgery Back Surgery x 4 Cholecystectomy ERCP/SHINCTEROTOMY 05/19/09   Family History: Reviewed history from 05/13/2009 and no changes required. No FH of Colon Cancer: Lung Cancer: Brother Family History of Colon Polyps:Brother,                                                                                                                                                                                                                                                                                                                                                        Social History: Reviewed history from 05/13/2009 and no changes required. Occupation: Retired Patent examiner Patient currently smokes: Occ  Illicit Drug Use - no  Alcohol Use - no Patient does not get regular exercise.   Review of Systems       The patient complains of allergy/sinus and night sweats.         The pertinent positives and negatives are noted as above and in the HPI. All other ROS were reviewed and were negative.   Vital Signs:  Patient profile:   55 year old male Height:      72 inches Weight:      206.25 pounds BMI:     28.07 Temp:     98.1 degrees F oral Pulse rate:   84 / minute Pulse rhythm:   regular BP sitting:   124 / 76  (left arm)  Vitals Entered By: Milford Cage NCMA (November 24, 2009 11:13 AM)  Physical Exam  General:  Well developed, well nourished, no acute distress. Uncomfortable-appearing. Head:  Normocephalic and atraumatic. Eyes:  PERRLA, no icterus. Ears:  Normal auditory acuity. Mouth:  No deformity or lesions, dentition normal. Lungs:  Clear throughout to auscultation. Heart:  Regular rate and rhythm; no murmurs, rubs,  or  bruits. Abdomen:  Right upper quadrant tenderness to deep palpation without rebound or guarding. Normal active bowel sounds. Soft. No distention. No organomegaly or masses Neurologic:  Alert and  oriented x4;  grossly normal neurologically. Psych:  Alert and cooperative. Normal mood and affect.  Impression & Recommendations:  Problem # 1:  ABDOMINAL PAIN RIGHT UPPER QUADRANT (ICD-789.01) Recurrent right upper quadrant pain, associated with anorexia and nausea, sweats, and chills. No documented fever.  Etiology unclear. Rule out nephrolithiasis, pulmonary embolism, pancreatitis, and biliary related symptoms. He may need hospitalization for further evaluation and pain control. Consider EGD. Orders: CT Chest/Abdomen w IV and Oral Contrast (CT CH/ABD w IV/Oral ) TLB-BMP (Basic Metabolic Panel-BMET) (80048-METABOL) TLB-Hepatic/Liver Function Pnl (80076-HEPATIC) TLB-CBC Platelet - w/Differential (85025-CBCD) TLB-Amylase (82150-AMYL) TLB-Lipase (83690-LIPASE) TLB-Udip w/ Micro (81001-URINE)  Problem # 2:  NONSPECIFIC ABN FINDNG RAD&OTH EXAM BILARY TRCT (ICD-793.3) Prior apparent distal CBD lesion on ERCP and MRCP in August 2010 that has not been noted on 2 subsequent endoscopic ultrasound at [facility] and on repeat MRCP. I do not have adequate explanation for this series of results.  Patient Instructions: 1)  Get your labs drawn today in the basement.  2)  You have been scheduled for a CT of the chest angiogram and abdomen today and they will call us after test with results.  3)  Copy sent to : Marjory Lies, MD 4)  The medication list was reviewed and reconciled.  All changed / newly prescribed medications were explained.  A complete medication list was provided to the patient / caregiver.

## 2010-11-02 NOTE — Letter (Signed)
Summary: EGD Instructions  [facility]  [address] [place], [place] [address]   Phone: [phone]  Fax: [phone]       Russell Padilla    March 12, 1956    MRN: [id number]       Procedure Day /Date:Friday February 25th, 2011     Arrival Time:  9:00am     Procedure Time: 10:00am     Location of Procedure:                    _ x _ [facility] (4th Floor)    PREPARATION FOR ENDOSCOPY   On  11/27/09  THE DAY OF THE PROCEDURE:  1.   No solid foods, milk or milk products are allowed after midnight the night before your procedure.  2.   Do not drink anything colored red or purple.  Avoid juices with pulp.  No orange juice.  3.  You may drink clear liquids until 8:00am , which is 2 hours before your procedure.                                                                                                CLEAR LIQUIDS INCLUDE: Water Jello Ice Popsicles Tea (sugar ok, no milk/cream) Powdered fruit flavored drinks Coffee (sugar ok, no milk/cream) Gatorade Juice: apple, white grape, white cranberry  Lemonade Clear bullion, consomm, broth Carbonated beverages (any kind) Strained chicken noodle soup Hard Candy   MEDICATION INSTRUCTIONS  Unless otherwise instructed, you should take regular prescription medications with a small sip of water as early as possible the morning of your procedure.           OTHER INSTRUCTIONS  You will need a responsible adult at least 56 years of age to accompany you and drive you home.   This person must remain in the waiting room during your procedure.  Wear loose fitting clothing that is easily removed.  Leave jewelry and other valuables at home.  However, you may wish to bring a book to read or an iPod/MP3 player to listen to music as you wait for your procedure to start.  Remove all body piercing jewelry and leave at home.  Total time from sign-in until discharge is approximately 2-3 hours.  You should go  home directly after your procedure and rest.  You can resume normal activities the day after your procedure.  The day of your procedure you should not:   Drive   Make legal decisions   Operate machinery   Drink alcohol   Return to work  You will receive specific instructions about eating, activities and medications before you leave.    The above instructions have been reviewed and explained to me by   Marchelle Folks    I fully understand and can verbalize these instructions _____________________________ Date _________

## 2010-11-02 NOTE — Progress Notes (Signed)
Summary: BLOOD WORK RESULTS  Phone Note Call from Patient Call back at Home Phone [phone]   Caller: Patient Call For: DR. Russella Dar Reason for Call: Talk to Nurse Details for Reason: REQUESTING BLOOD WORK RESULTS Summary of Call: MR. Boliver CAME INTO THE OFFICE TODAY REQUESTING TO SPEAK TO [name].  ASKED PATIENT IF I COULD RELAY A MESSAGE TO HER. PATIENT STATED HE CAME INTO THE LAB TODAY AND HAD BLOOD WORK DONE AND CAME TO OUR OFFICE REQUESTING A COPY OF PREVIOUS BLOOD WORK HE HAD DONE IN APPROXIMATELY FEBRUARY OF THIS YEAR.  PLEASE CALL IF AND WHEN PATIENT MAY BE ABLE TO PICK UP THESE COPIES. Initial call taken by: Schuyler Amor,  January 05, 2010 12:51 PM  Follow-up for Phone Call        Patient  aware I will provide him copies by mail or he can come pick up lab results when we have the results of today's lab work. Follow-up by: Darcey Nora RN, CGRN,  January 05, 2010 1:23 PM

## 2010-11-02 NOTE — Miscellaneous (Signed)
Summary: gi medications  Clinical Lists Changes  Medications: Added new medication of PANTOPRAZOLE SODIUM 40 MG  TBEC (PANTOPRAZOLE SODIUM) 1 twice a day 30 minutes before meals - Signed Added new medication of DIFLUCAN 100 MG  TABS (FLUCONAZOLE) one by mouth daily - Signed Added new medication of CARAFATE 1 GM  TABS (SUCRALFATE) by mouth three times a day - Signed Rx of PANTOPRAZOLE SODIUM 40 MG  TBEC (PANTOPRAZOLE SODIUM) 1 twice a day 30 minutes before meals;  #60 x 5;  Signed;  Entered by: Eual Fines RN;  Authorized by: Meryl Dare MD [facility];  Method used: Electronically to [facility]. [phone]*, [address] [place], [place], [place]  [address], Ph: [phone] or [phone], Fax: [phone] Rx of DIFLUCAN 100 MG  TABS (FLUCONAZOLE) one by mouth daily;  #7 x 0;  Signed;  Entered by: Eual Fines RN;  Authorized by: Meryl Dare MD [facility];  Method used: Electronically to [facility]. [phone]*, [address] [place], [place], [place]  [address], Ph: [phone] or [phone], Fax: [phone] Rx of CARAFATE 1 GM  TABS (SUCRALFATE) by mouth three times a day;  #90 x 1;  Signed;  Entered by: Eual Fines RN;  Authorized by: Meryl Dare MD [facility];  Method used: Electronically to [facility]. [phone]*, [address], [place], [place], [place]  [address], Ph: [phone] or [phone], Fax: [phone] Allergies: Added new allergy or adverse reaction of LATEX EXAM GLOVES (DISPOSABLE GLOVES) Observations: Added new observation of NKA: F (11/27/2009 10:40)    Prescriptions: CARAFATE 1 GM  TABS (SUCRALFATE) by mouth three times a day  #90 x 1   Entered by:   Eual Fines RN   Authorized by:   Meryl Dare MD [facility]   Signed by:   Eual Fines RN on 11/27/2009   Method used:   Electronically to        [facility] [phone]* (retail)       [address]       [place], [place]  [address]       Ph: [phone] or  [phone]       Fax: [phone]   RxID:   [id number] DIFLUCAN 100 MG  TABS (FLUCONAZOLE) one by mouth daily  #7 x 0   Entered by:   Eual Fines RN   Authorized by:   Meryl Dare MD [facility]   Signed by:   Eual Fines RN on 11/27/2009   Method used:   Electronically to        [facility] [phone]* (retail)       [address]       [place], [place]  [address]       Ph: [phone] or [phone]       Fax: [phone]   RxID:   [id number] PANTOPRAZOLE SODIUM 40 MG  TBEC (PANTOPRAZOLE SODIUM) 1 twice a day 30 minutes before meals  #60 x 5   Entered by:   Eual Fines RN   Authorized by:   Meryl Dare MD [facility]   Signed by:   Eual Fines RN on 11/27/2009   Method used:   Electronically to        [facility] [phone]* (retail)       [address]       [place]  [place], [place]  [address]       Ph: [phone] or [phone]       Fax: [phone]   RxID:   [id number]

## 2010-11-02 NOTE — Medication Information (Signed)
Summary: Pantoprazole Approved/[employer]  Pantoprazole Approved/[employer]   Imported By: Sherian Rein 01/05/2010 14:38:45  _____________________________________________________________________  External Attachment:    Type:   Image     Comment:   External Document

## 2010-11-04 NOTE — Progress Notes (Signed)
Summary: triage  Phone Note Call from Patient Call back at Home Phone [phone]   Caller: Patient Call For: Dr Russella Dar Reason for Call: Talk to Nurse Summary of Call: Patient states that he is having abd pain like he did last time he was here, wants to know what to do before it gets to the point that he has to go to the hosp. Initial call taken by: Tawni Levy,  October 20, 2010 2:35 PM  Follow-up for Phone Call        Patient called back to report that he has developed abdominal pain again.  I have sent his a refill on glycopyrolate and carafate.  He will come in and discuss abdominal pain and dysphagia on 11/02/10  to see Dr Russella Dar.   Follow-up by: Darcey Nora RN, CGRN,  October 20, 2010 4:22 PM    Prescriptions: CARAFATE 1 GM  TABS (SUCRALFATE) by mouth three times a day  #90 x 3   Entered by:   Darcey Nora RN, CGRN   Authorized by:   Meryl Dare MD [facility]   Signed by:   Darcey Nora RN, CGRN on 10/20/2010   Method used:   Electronically to        [facility]. [phone]* (retail)       [address]       [place], [place]  [address]       Ph: [phone] or [phone]       Fax: [phone]   RxID:   [id number] GLYCOPYRROLATE 2 MG TABS (GLYCOPYRROLATE) one tablet by mouth two times a day  #60 x 3   Entered by:   Darcey Nora RN, CGRN   Authorized by:   Meryl Dare MD [facility]   Signed by:   Darcey Nora RN, CGRN on 10/20/2010   Method used:   Electronically to        [facility] [phone]* (retail)       [address]       [place], [place]  [address]       Ph: [phone] or [phone]       Fax: [phone]   RxID:   [id number]

## 2010-11-10 NOTE — Letter (Signed)
Summary: [facility] Instructions  [facility]  [address] [place], [place] [address]   Phone: [phone]  Fax: [phone]       Russell Padilla    [birth year]    MRN: [id number]        Procedure Day /Date: 11/15/10 Monday     Arrival Time: 12:30 pm     Procedure Time: 1:30 pm     Location of Procedure:                    _x _  [facility] (4th Floor)  PREPARATION FOR COLONOSCOPY WITH MOVIPREP   Starting 5 days prior to your procedure 11/11/10 do not eat nuts, seeds, popcorn, corn, beans, peas,  salads, or any raw vegetables.  Do not take any fiber supplements (e.g. Metamucil, Citrucel, and Benefiber).  THE DAY BEFORE YOUR PROCEDURE         DATE: 11/14/10  DAY: Sunday  1.  Drink clear liquids the entire day-NO SOLID FOOD  2.  Do not drink anything colored red or purple.  Avoid juices with pulp.  No orange juice.  3.  Drink at least 64 oz. (8 glasses) of fluid/clear liquids during the day to prevent dehydration and help the prep work efficiently.  CLEAR LIQUIDS INCLUDE: Water Jello Ice Popsicles Tea (sugar ok, no milk/cream) Powdered fruit flavored drinks Coffee (sugar ok, no milk/cream) Gatorade Juice: apple, white grape, white cranberry  Lemonade Clear bullion, consomm, broth Carbonated beverages (any kind) Strained chicken noodle soup Hard Candy                             4.  In the morning, mix first dose of MoviPrep solution:    Empty 1 Pouch A and 1 Pouch B into the disposable container    Add lukewarm drinking water to the top line of the container. Mix to dissolve    Refrigerate (mixed solution should be used within 24 hrs)  5.  Begin drinking the prep at 5:00 p.m. The MoviPrep container is divided by 4 marks.   Every 15 minutes drink the solution down to the next mark (approximately 8 oz) until the full liter is complete.   6.  Follow completed prep with 16 oz of clear liquid of your choice (Nothing red or purple).  Continue  to drink clear liquids until bedtime.  7.  Before going to bed, mix second dose of MoviPrep solution:    Empty 1 Pouch A and 1 Pouch B into the disposable container    Add lukewarm drinking water to the top line of the container. Mix to dissolve    Refrigerate  THE DAY OF YOUR PROCEDURE      DATE: 11/15/10 DAY: Monday  Beginning at 8:30 a.m. (5 hours before procedure):         1. Every 15 minutes, drink the solution down to the next mark (approx 8 oz) until the full liter is complete.  2. Follow completed prep with 16 oz. of clear liquid of your choice.    3. You may drink clear liquids until 11:30 am (2 HOURS BEFORE PROCEDURE).   MEDICATION INSTRUCTIONS  Unless otherwise instructed, you should take regular prescription medications with a small sip of water   as early as possible the morning of your procedure.        OTHER INSTRUCTIONS  You will need a responsible adult at least 56 years  of age to accompany you and drive you home.   This person must remain in the waiting room during your procedure.  Wear loose fitting clothing that is easily removed.  Leave jewelry and other valuables at home.  However, you may wish to bring a book to read or  an iPod/MP3 player to listen to music as you wait for your procedure to start.  Remove all body piercing jewelry and leave at home.  Total time from sign-in until discharge is approximately 2-3 hours.  You should go home directly after your procedure and rest.  You can resume normal activities the  day after your procedure.  The day of your procedure you should not:   Drive   Make legal decisions   Operate machinery   Drink alcohol   Return to work  You will receive specific instructions about eating, activities and medications before you leave.    The above instructions have been reviewed and explained to me by   _______________________    I fully understand and can verbalize these instructions  _____________________________ Date _________

## 2010-11-10 NOTE — Assessment & Plan Note (Signed)
Summary: abdominal pain and dysphagia/sheri   History of Present Illness Visit Type: Follow-up Visit Primary GI MD: Elie Goody MD [facility] Primary Provider: Marjory Lies, MD Requesting Provider: n/a Chief Complaint: Pt c/o RUQ abd pain, bloating, nausea with one episode of vomiting, burning in chest and constipation for twenty years  History of Present Illness:   [name] has recurrent problems with postprandial abdominal bloating and epigastric pain. He states sometimes he is so bloated that it causes shortness of breath. He has ongoing problems with chronic constipation and has used Senokot for many years. He discontinued Senokot a few months ago and is now taking fiber capsules which have not been effective.  He states he has problems gas and recently had a flare of hemorrhoid symptoms, which have resolved with  over-the-counter creams and suppositories.  He notes worsening problems with solid food dysphagia over the past several weeks. He frequently notes improvement in his symptoms with the use of glycopyrrolate and Maalox although glycopyrrolate has led to a dry mouth and throat.   GI Review of Systems    Reports abdominal pain, bloating, heartburn, nausea, and  vomiting.     Location of  Abdominal pain: epigastric area.    Denies acid reflux, belching, chest pain, dysphagia with liquids, dysphagia with solids, loss of appetite, vomiting blood, weight loss, and  weight gain.      Reports constipation.     Denies anal fissure, black tarry stools, change in bowel habit, diarrhea, diverticulosis, fecal incontinence, heme positive stool, hemorrhoids, irritable bowel syndrome, jaundice, light color stool, liver problems, rectal bleeding, and  rectal pain.   Current Medications (verified): 1)  Benicar 40 Mg Tabs (Olmesartan Medoxomil) .... One Tablet By Mouth Once Daily 2)  Bayer Aspirin Ec Low Dose 81 Mg Tbec (Aspirin) .... One Tablet By Mouth Once Daily 3)  Centrum Silver  Tabs  (Multiple Vitamins-Minerals) .... One Tablet By Mouth Once Daily 4)  Fiber 625 Mg Tabs (Calcium Polycarbophil) .... Two Tablets By Mouth Once Daily 5)  Simvastatin 20 Mg Tabs (Simvastatin) .... One Tablet By Mouth Once Daily 6)  Pantoprazole Sodium 40 Mg  Tbec (Pantoprazole Sodium) .Marland Kitchen.. 1 Each Day 30 Minutes Before Meals Two Times A Day 7)  Oxycodone-Acetaminophen 10-325 Mg Tabs (Oxycodone-Acetaminophen) .... One Tablet By Mouth Every 6 Hours As Needed 8)  Glycopyrrolate 2 Mg Tabs (Glycopyrrolate) .... One Tablet By Mouth Two Times A Day 9)  Carafate 1 Gm  Tabs (Sucralfate) .... By Mouth Three Times A Day  Allergies (verified): 1)  ! Latex Exam Gloves (Disposable Gloves)  Past History:  Past Medical History: Adenomatous Colon Polyps 2001 Distal CBD lesion on MRCP and ERCP 05/12/2009, bx negative, EUS X2 negative HEMORRHOIDS (ICD-455.6) GERD (ICD-530.81) HYPERLIPIDEMIA (ICD-272.4) HYPERTENSION (ICD-401.9) PERSONAL HX COLONIC POLYPS (ICD-V12.72) NEPHROLITHIASIS (ICD-592.0) THROMBOCYTOPENIA, CHRONIC (ICD-287.5)  Past Surgical History: Left Knee Surgery Back Surgery x 4 Cholecystectomy ERCP/SHINCTEROTOMY 05/2009   Family History: Reviewed history from 05/13/2009 and no changes required. No FH of Colon Cancer: Lung Cancer: Brother Family History of Colon Polyps:Brother,  Social History: Occupation: Retired Patent examiner Married Patient currently smokes: Occ  Illicit Drug Use - no  Alcohol Use - no Patient does not get regular exercise.   Review of Systems       The patient complains of allergy/sinus, back pain, cough, and fatigue.          The pertinent positives and negatives are noted as above and in the HPI. All other ROS were reviewed and were negative.   Vital Signs:  Patient profile:   55 year old male Height:      72 inches Weight:      214 pounds BMI:     29.13 BSA:     2.19 Pulse rate:   76 / minute Pulse rhythm:   regular BP sitting:   124 / 80  (left arm) Cuff size:   regular  Vitals Entered By: Ok Anis CMA (November 02, 2010 3:08 PM)  Physical Exam  General:  Well developed, well nourished, no acute distress. Frustrated with recurrent GI problems. Head:  Normocephalic and atraumatic. Eyes:  PERRLA, no icterus. Ears:  Normal auditory acuity. Mouth:  No deformity or lesions, dentition normal. Neck:  Supple; no masses or thyromegaly. Lungs:  Clear throughout to auscultation. Heart:  Regular rate and rhythm; no murmurs, rubs,  or bruits. Abdomen:  Soft, nontender and nondistended. No masses, hepatosplenomegaly or hernias noted. Normal bowel sounds. Rectal:  deferred until time of colonoscopy.   Msk:  Symmetrical with no gross deformities. Normal posture. Pulses:  Normal pulses noted. Extremities:  No clubbing, cyanosis, edema or deformities noted. Neurologic:  Alert and  oriented x4;  grossly normal neurologically. Cervical Nodes:  No significant cervical adenopathy. Inguinal Nodes:  No significant inguinal adenopathy. Psych:  Alert and cooperative. Frustrated.  Impression & Recommendations:  Problem # 1:  ABDOMINAL PAIN-EPIGASTRIC (ICD-789.06) Chronic, recurrent abdominal pain. This is associated with bloating and worsened by meals. In addition, he has chronic constipation. Given his extensive prior evaluation, and frequently recurrent symptoms, I suspect he has a functional bowel disorder or an underlying motility disorder. Rule out gastroparesis. Rule out a flare of GERD. Rule out esophageal stricture. The risks, benefits and alternatives to endoscopy with possible biopsy and possible  dilation were discussed with the patient and they consent to proceed. The procedure will be scheduled electively. Schedule GES and bloodwork. Decrease glycopyrrolate to 1 mg twice daily due to side effects. Orders: Gastric Emptying Scan (GES) TLB-CBC Platelet - w/Differential (85025-CBCD) TLB-TSH (Thyroid Stimulating Hormone) (84443-TSH) TLB-Hepatic/Liver Function Pnl (80076-HEPATIC) TLB-BMP (Basic Metabolic Panel-BMET) (80048-METABOL) TLB-Lipase (83690-LIPASE)  Problem # 2:  CONSTIPATION (ICD-564.00) Discontinue current fiber pills and start Benefiber daily. Increase dietary fiber and fluid. Begin MiraLax once or twice daily, titrated for adequate bowel movements.  Problem # 3:  PERSONAL HX COLONIC POLYPS (ICD-V12.72) Personal history of adenomatous colon polyps diagnosed in 2001. Rule out recurrent adenomatous colon polyps. Further evaluate constipation. The risks, benefits and alternatives to colonoscopy with possible biopsy and possible polypectomy were discussed with the patient and they consent to proceed. The procedure will be scheduled electively. Orders: Colon/Endo (Colon/Endo)  Problem # 4:  FLATULENCE-GAS-BLOATING (ICD-787.3) Begin a low gas diet and Gas-X q.i.d. p.r.n. Consider a trial of a probiotic.  Problem # 5:  DYSPHAGIA (ZOX-096.04) Possible esophageal stricture. Possible reflux related. Orders: Colon/Endo (Colon/Endo)  Problem # 6:  GERD (ICD-530.81) Continue pantoprazole 40 mg twice daily and continue to follow standard antireflux measures.  Patient Instructions: 1)  You have been scheduled for  an endoscopy and colonoscopy. Please follow written instructions that were given to you at your office visit today.  2)  Please pick up your prescription for Moviprep at the pharmacy. An electronic presription has already been sent.  3)  You have been scheduled for a Gastric Emptying Scan at [facility] on 11/22/10 @ 10 am. Please follow written instructions provided  to you at your visit today. 4)  Your physician requests that you go to the basement floor of our office to have the following labwork completed before leaving today: [facility], Lipase 5)  Please DISCONTINUE your fiber pills and take Benefiber 1 heaping teaspoon dissolved in at least 8 ounces water/juice daily. This may be purchased over the counter. 6)  Change your glycopyrrolate dose to 1/2 tablet by mouth two times a day. 7)  Take Gas-X 1 capsule by mouth four times daily as needed. This may be purchased over the counter. 8)  Copy sent to : Marjory Lies, MD 9)  The medication list was reviewed and reconciled.  All changed / newly prescribed medications were explained.  A complete medication list was provided to the patient / caregiver.  Prescriptions: MOVIPREP 100 GM  SOLR (PEG-KCL-NACL-NASULF-NA ASC-C) As per prep instructions.  #1 x 0   Entered by:   Lamona Curl CMA (AAMA)   Authorized by:   Meryl Dare MD [facility]   Signed by:   Lamona Curl CMA (AAMA) on 11/02/2010   Method used:   Electronically to        [facility]. [phone]* (retail)       [address]       [place], [place]  [address]       Ph: [phone] or [phone]       Fax: [phone]   RxID:   [id number]

## 2010-11-15 ENCOUNTER — Encounter (AMBULATORY_SURGERY_CENTER): Payer: 59 | Admitting: Gastroenterology

## 2010-11-15 ENCOUNTER — Other Ambulatory Visit: Payer: Self-pay | Admitting: Gastroenterology

## 2010-11-15 ENCOUNTER — Telehealth: Payer: Self-pay | Admitting: Internal Medicine

## 2010-11-15 DIAGNOSIS — K621 Rectal polyp: Secondary | ICD-10-CM

## 2010-11-15 DIAGNOSIS — K2940 Chronic atrophic gastritis without bleeding: Secondary | ICD-10-CM

## 2010-11-15 DIAGNOSIS — Z1211 Encounter for screening for malignant neoplasm of colon: Secondary | ICD-10-CM

## 2010-11-15 DIAGNOSIS — Z86010 Personal history of colonic polyps: Secondary | ICD-10-CM

## 2010-11-15 DIAGNOSIS — R1319 Other dysphagia: Secondary | ICD-10-CM

## 2010-11-15 DIAGNOSIS — D126 Benign neoplasm of colon, unspecified: Secondary | ICD-10-CM

## 2010-11-15 DIAGNOSIS — K620 Anal polyp: Secondary | ICD-10-CM

## 2010-11-15 DIAGNOSIS — K2970 Gastritis, unspecified, without bleeding: Secondary | ICD-10-CM

## 2010-11-15 DIAGNOSIS — K648 Other hemorrhoids: Secondary | ICD-10-CM

## 2010-11-19 ENCOUNTER — Telehealth: Payer: Self-pay | Admitting: Gastroenterology

## 2010-11-22 ENCOUNTER — Encounter (HOSPITAL_COMMUNITY)
Admission: RE | Admit: 2010-11-22 | Discharge: 2010-11-22 | Disposition: A | Discharge status: Home / Self Care | Payer: Worker's Compensation | Source: Ambulatory Visit | Attending: Gastroenterology | Admitting: Gastroenterology

## 2010-11-22 ENCOUNTER — Encounter: Payer: Self-pay | Admitting: Gastroenterology

## 2010-11-22 DIAGNOSIS — J701 Chronic and other pulmonary manifestations due to radiation: Secondary | ICD-10-CM | POA: Insufficient documentation

## 2010-11-22 DIAGNOSIS — R109 Unspecified abdominal pain: Secondary | ICD-10-CM | POA: Insufficient documentation

## 2010-11-22 DIAGNOSIS — IMO0001 Reserved for inherently not codable concepts without codable children: Secondary | ICD-10-CM

## 2010-11-22 MED ORDER — TECHNETIUM TC 99M SULFUR COLLOID
2.1000 | Freq: Once | INTRAVENOUS | Status: AC | PRN
  Administered 2010-11-22: 2.1 via INTRAVENOUS

## 2010-11-24 NOTE — Progress Notes (Signed)
Summary: Triage  Phone Note Call from Patient Call back at Home Phone [phone] Call back at [phone]   Caller: Patient Call For: Dr.Stark Reason for Call: Talk to Nurse Summary of Call: Pt is calling because he is having problems since his procedure and needs to speak with a nurse, call home number first if no answer call other number Initial call taken by: [place] Johnson,  November 19, 2010 8:39 AM  Follow-up for Phone Call        Patient c/o continued rectal soreness and some rectal bleeding.  C/O small amount of bright red bleeding mainly on the tissue.  He states he has some abdominal soreness and lots of gas.  Dr Russella Dar is it ok to call in some anusol?   Follow-up by: Darcey Nora RN, CGRN,  November 19, 2010 9:17 AM  Additional Follow-up for Phone Call Additional follow up Details #1::        Anusol HC cream two times a day and Anusol HC supp two times a day for 7 days and then as needed. Standard rectal care instructions. Gas X qid Align one day for one month. Additional Follow-up by: Meryl Dare MD Clementeen Graham,  November 19, 2010 9:53 AM    Additional Follow-up for Phone Call Additional follow up Details #2::    patient advised of Dr Ardell Isaacs recommendations. Follow-up by: Darcey Nora RN, CGRN,  November 19, 2010 3:15 PM  New/Updated Medications: ALIGN 4 MG CAPS (PROBIOTIC PRODUCT) 1 by mouth once daily HEMORRHOIDAL-HC 25 MG SUPP (HYDROCORTISONE ACETATE) 1 pr two times a day for 7 days ANUSOL-HC 2.5 % CREA (HYDROCORTISONE) apply to rectal area two times a day for 7 days then prn Prescriptions: ANUSOL-HC 2.5 % CREA (HYDROCORTISONE) apply to rectal area two times a day for 7 days then prn  #30 gm x 1   Entered by:   Darcey Nora RN, CGRN   Authorized by:   Meryl Dare MD [facility]   Signed by:   Darcey Nora RN, CGRN on 11/19/2010   Method used:   Electronically to        [facility]. [phone]* (retail)       [address]       [place], [place]   [address]       Ph: [phone] or [phone]       Fax: [phone]   RxID:   [id number] HEMORRHOIDAL-HC 25 MG SUPP (HYDROCORTISONE ACETATE) 1 pr two times a day for 7 days  #14 x 1   Entered by:   Darcey Nora RN, CGRN   Authorized by:   Meryl Dare MD [facility]   Signed by:   Darcey Nora RN, CGRN on 11/19/2010   Method used:   Electronically to        [facility]. [phone]* (retail)       [address]       [place], [place]  [address]       Ph: [phone] or [phone]       Fax: [phone]   RxID:   [id number]

## 2010-11-24 NOTE — Procedures (Addendum)
Summary: Upper Endoscopy  Patient: [name] Note: All result statuses are Final unless otherwise noted.  Tests: (1) Upper Endoscopy (EGD)   EGD Upper Endoscopy       DONE     [facility]     [address]     [place], [place]  [address]           ENDOSCOPY PROCEDURE REPORT           PATIENT:  Russell Padilla, Russell Padilla  MR#:  [id number]     BIRTHDATE:  1956-01-29, 54 yrs. old  GENDER:  male     ENDOSCOPIST:  Judie Petit T. Russella Dar, MD, [facility]           PROCEDURE DATE:  11/15/2010     PROCEDURE:  EGD with dilatation over guidewire, 43248, EGD with     biopsy, 43239     ASA CLASS:  Class II     INDICATIONS:  epigastric pain, dysphagia, GERD     MEDICATIONS:                         There was residual sedation     effect present from prior procedure, Fentanyl 25 mcg, Versed 3 mg     IV     TOPICAL ANESTHETIC:  Exactacain Spray     DESCRIPTION OF PROCEDURE:   After the risks benefits and     alternatives of the procedure were thoroughly explained, informed     consent was obtained.  The LB GIF-H180 [id number] endoscope was     introduced through the mouth and advanced to the second portion of     the duodenum, without limitations.  The instrument was slowly     withdrawn as the mucosa was fully examined.     <<PROCEDUREIMAGES>>     The esophagus and gastroesophageal junction were completely normal     in appearance. Savary dilation over a guidewire performed with a     17mm dilator for dysphagia without a stricture. No heme or     resistance noted. Mild gastritis was found in the body and the     antrum of the stomach. Otherwise normal stomach. The duodenal bulb     was normal in appearance, as was the postbulbar duodenum.     Retroflexed views revealed no abnormalities. The scope was then     withdrawn from the patient and the procedure completed.           COMPLICATIONS:  None           ENDOSCOPIC IMPRESSION:     1) Mild gastritis           RECOMMENDATIONS:     1) Anti-reflux  regimen     2) post dilation instructions     3) continue PPI     4) Await pathology results           Malyssa Maris T. Russella Dar, MD, [facility]           CC:  Marjory Lies, MD           n.     Rosalie DoctorVenita Lick. Flor Houdeshell at 11/15/2010 02:11 PM           Russell Padilla, Russell Padilla, [id number]  Note: An exclamation mark (!) indicates a result that was not dispersed into the flowsheet. Document Creation Date: 11/15/2010 2:12 PM _______________________________________________________________________  (1) Order result status: Final Collection or observation date-time: 11/15/2010 14:07 Requested date-time:  Receipt  date-time:  Reported date-time:  Referring Physician:   Ordering Physician: Claudette Head [phone]) Specimen Source:  Source: Launa Grill Order Number: [id number] Lab site:   Appended Document: Upper Endoscopy    Clinical Lists Changes  Medications: Changed medication from PANTOPRAZOLE SODIUM 40 MG  TBEC (PANTOPRAZOLE SODIUM) 1 each day 30 minutes before meals two times a day to DEXILANT 60 MG CPDR (DEXLANSOPRAZOLE) one tablet by mouth once daily

## 2010-11-24 NOTE — Procedures (Addendum)
Summary: Colonoscopy  Patient: Russell Padilla Note: All result statuses are Final unless otherwise noted.  Tests: (1) Colonoscopy (COL)   COL Colonoscopy           DONE      [facility]     [address]     [place], [place]  [address]           COLONOSCOPY PROCEDURE REPORT           PATIENT:  Russell Padilla, Russell Padilla  MR#:  [id number]     BIRTHDATE:  [birth year], 54 yrs. old  GENDER:  male     ENDOSCOPIST:  Judie Petit T. Russella Dar, MD, [facility]           PROCEDURE DATE:  11/15/2010     PROCEDURE:  Colonoscopy with biopsy, w/ inj sclerosis of     hemorrhoids     ASA CLASS:  Class II     INDICATIONS:  1) surveillance and high-risk screening  2) history     of pre-cancerous (adenomatous) colon polyps: 2001.     MEDICATIONS:   Fentanyl 75 mcg IV, Versed 7 mg IV     DESCRIPTION OF PROCEDURE:   After the risks benefits and     alternatives of the procedure were thoroughly explained, informed     consent was obtained.  Digital rectal exam was performed and     revealed no abnormalities.   The LB PCF-H180AL [id number] endoscope     was introduced through the anus and advanced to the cecum, which     was identified by both the appendix and ileocecal valve, without     limitations.  The quality of the prep was excellent, using     MoviPrep.  The instrument was then slowly withdrawn as the colon     was fully examined.     <<PROCEDUREIMAGES>>     FINDINGS:  Mild diverticulosis was found in the sigmoid to     descending colon.  Two polyps were found in the rectum. They were     3 - 4 mm in size. The polyps were removed using cold biopsy     forceps.  Internal Hemorrhoids were found. They were small and     erythematous. Injection sclerosis of internal hemorrhoids above     the dentate line with 1.5 cc of 23.4% saline.  A normal appearing     cecum, ileocecal valve, and appendiceal orifice were identified.     The ascending, hepatic flexure, transverse, splenic flexure     appeared unremarkable.  Retroflexed views in the rectum revealed no     other findings other than those already described. The time to     cecum =  1.5  minutes. The scope was then withdrawn (time =  12.5     min) from the patient and the procedure completed.           COMPLICATIONS:  None           ENDOSCOPIC IMPRESSION:     1) Mild diverticulosis in the sigmoid to descending colon     2) 3 - 4 mm Two polyps in the rectum     3) Internal hemorrhoids           RECOMMENDATIONS:     1) Await pathology results     2) High fiber diet with liberal fluid intake.     3) Repeat Colonoscopy in 5 years pending pathology review.  Venita Lick. Russella Dar, MD, [facility]           CC:  Marjory Lies, MD           n.     Rosalie DoctorVenita Lick. Stark at 11/15/2010 01:55 PM           Berner[place], [place], [id number]  Note: An exclamation mark (!) indicates a result that was not dispersed into the flowsheet. Document Creation Date: 11/15/2010 1:56 PM _______________________________________________________________________  (1) Order result status: Final Collection or observation date-time: 11/15/2010 13:50 Requested date-time:  Receipt date-time:  Reported date-time:  Referring Physician:   Ordering Physician: Claudette Head [phone]) Specimen Source:  Source: Launa Grill Order Number: [id number] Lab site:   Appended Document: Colonoscopy     Procedures Next Due Date:    Colonoscopy: 11/2015

## 2010-11-24 NOTE — Progress Notes (Signed)
Summary: ON CALL: Rectal bleeding post colonoscopy today  Phone Note Call from Patient   Caller: Patient Call For: Dr Russella Dar Details for Reason: rectal bleeding Summary of Call: Patient had colonoscopy today (report reviewed). Calls tonight noting blood in toilet when passing gas. No other c/o.  Had 2 tiny polyps cold bx and hemorrhoids sclerosed. Could have a little bleeding from either. Told to monitor. Call back if it worsens/persists. Call office tomorrow for the same. Will forward this note to Dr. Russella Dar. Initial call taken by: Hilarie Fredrickson MD,  November 15, 2010 8:47 PM

## 2010-11-25 ENCOUNTER — Other Ambulatory Visit: Payer: Self-pay | Admitting: Neurological Surgery

## 2010-11-25 DIAGNOSIS — M542 Cervicalgia: Secondary | ICD-10-CM

## 2010-11-30 NOTE — Letter (Signed)
Summary: Patient [facility] Biopsy Results   Gastroenterology  [address] [place], [place] [address]   Phone: [phone]  Fax: [phone]        November 22, 2010 MRN: [id number]    [facility] [address] [place], [place]  [address]    Dear Russell Padilla,  I am pleased to inform you that the biopsies taken during your recent endoscopic examination did not show any evidence of cancer upon pathologic examination. The biopsies showed mild gastritis.  Continue with the treatment plan as outlined on the day of your      exam.  Please call us if you are having persistent problems or have questions about your condition that have not been fully answered at this time.  Sincerely,  Meryl Dare MD [facility]  This letter has been electronically signed by your physician.  Appended Document: Patient Notice-Endo Biopsy Results letter mailed

## 2010-11-30 NOTE — Letter (Signed)
Summary: Patient Notice-Hyperplastic Polyps  [facility]  [address] [place], [place] [address]   Phone: [phone]  Fax: [phone]        November 22, 2010 MRN: [id number]    [facility] [address] [place], [place]  [address]    Dear Mr. Heideman,  I am pleased to inform you that the colon polyp(s) removed during your recent colonoscopy was (were) found to be hyperplastic. These types of polyps are NOT pre-cancerous.  It is my recommendation that you have a repeat colonoscopy examination in 5 years.  Should you develop new or worsening symptoms of abdominal pain, bowel habit changes or bleeding from the rectum or bowels, please schedule an evaluation with either your primary care physician or with me.  Continue treatment plan as outlined the day of your exam.  Please call us if you are having persistent problems or have questions about your condition that have not been fully answered at this time.  Sincerely,  Meryl Dare MD [facility]  This letter has been electronically signed by your physician.  Appended Document: Patient Notice-Hyperplastic Polyps letter mailed

## 2010-12-01 ENCOUNTER — Ambulatory Visit
Admission: RE | Admit: 2010-12-01 | Discharge: 2010-12-01 | Disposition: A | Discharge status: Home / Self Care | Payer: 59 | Source: Ambulatory Visit | Attending: Neurological Surgery | Admitting: Neurological Surgery

## 2010-12-01 DIAGNOSIS — M542 Cervicalgia: Secondary | ICD-10-CM

## 2010-12-09 ENCOUNTER — Other Ambulatory Visit: Payer: Self-pay | Admitting: Neurological Surgery

## 2010-12-09 DIAGNOSIS — M542 Cervicalgia: Secondary | ICD-10-CM

## 2010-12-10 ENCOUNTER — Ambulatory Visit
Admission: RE | Admit: 2010-12-10 | Discharge: 2010-12-10 | Disposition: A | Discharge status: Home / Self Care | Payer: 59 | Source: Ambulatory Visit | Attending: Neurological Surgery | Admitting: Neurological Surgery

## 2010-12-10 DIAGNOSIS — M542 Cervicalgia: Secondary | ICD-10-CM

## 2011-01-07 LAB — POCT I-STAT, CHEM 8
BUN: 11 mg/dL (ref 6–23)
Calcium, Ion: 1.26 mmol/L (ref 1.12–1.32)
Chloride: 106 mEq/L (ref 96–112)
Creatinine, Ser: 1.2 mg/dL (ref 0.4–1.5)
Glucose, Bld: 95 mg/dL (ref 70–99)

## 2011-01-09 LAB — CBC
MCV: 95.4 fL (ref 78.0–100.0)
Platelets: 112 10*3/uL — ABNORMAL LOW (ref 150–400)
RDW: 13.9 % (ref 11.5–15.5)
WBC: 12.9 10*3/uL — ABNORMAL HIGH (ref 4.0–10.5)

## 2011-01-09 LAB — DIFFERENTIAL
Basophils Absolute: 0 10*3/uL (ref 0.0–0.1)
Eosinophils Relative: 1 % (ref 0–5)
Lymphocytes Relative: 8 % — ABNORMAL LOW (ref 12–46)
Monocytes Absolute: 0.6 10*3/uL (ref 0.1–1.0)
Monocytes Relative: 5 % (ref 3–12)
Neutro Abs: 11.1 10*3/uL — ABNORMAL HIGH (ref 1.7–7.7)

## 2011-01-09 LAB — URINALYSIS, ROUTINE W REFLEX MICROSCOPIC
Bilirubin Urine: NEGATIVE
Glucose, UA: NEGATIVE mg/dL
Ketones, ur: 15 mg/dL — AB
Specific Gravity, Urine: 1.022 (ref 1.005–1.030)
pH: 5.5 (ref 5.0–8.0)

## 2011-01-09 LAB — POCT CARDIAC MARKERS
CKMB, poc: <1 ng/mL — ABNORMAL LOW (ref 1.0–8.0)
Myoglobin, poc: 73.3 ng/mL (ref 12–200)
Troponin i, poc: <0.05 ng/mL (ref 0.00–0.09)

## 2011-01-09 LAB — H. PYLORI ANTIBODY, IGG: H Pylori IgG: <0.4 {ISR}

## 2011-01-09 LAB — URINE MICROSCOPIC-ADD ON

## 2011-01-09 LAB — COMPREHENSIVE METABOLIC PANEL
AST: 212 U/L — ABNORMAL HIGH (ref 0–37)
Albumin: 3.5 g/dL (ref 3.5–5.2)
BUN: 17 mg/dL (ref 6–23)
Chloride: 106 mEq/L (ref 96–112)
Creatinine, Ser: 1.35 mg/dL (ref 0.4–1.5)
GFR calc Af Amer: >60 mL/min (ref >60)
Potassium: 3.5 mEq/L (ref 3.5–5.1)
Total Bilirubin: 1.4 mg/dL — ABNORMAL HIGH (ref 0.3–1.2)
Total Protein: 5.5 g/dL — ABNORMAL LOW (ref 6.0–8.3)

## 2011-02-18 NOTE — Op Note (Signed)
[facility]  Patient:    Russell Padilla, Russell Padilla                       MRN: [id number] Proc. Date: 05/01/01 Adm. Date:  [id number] Attending:  Nelma Rothman Iii                           Operative Report  PREOPERATIVE DIAGNOSIS:  Gross hematuria, questionably positive cytology.  POSTOPERATIVE DIAGNOSIS:  Gross hematuria, questionably positive cytology.  OPERATION PERFORMED:  Cystourethroscopy, removal of small bladder stone, retrograde ureteropyelograms, barbotage, bilateral renal pelvic and bladder cytologies and bladder biopsies, and dilatation of meatal stenosis.  SURGEON:  Lucrezia Starch. Ovidio Hanger, M.D.  ANESTHESIA:  General laryngeal airway.  ESTIMATED BLOOD LOSS:  Negligible.  DRAINS:  None.  COMPLICATIONS:  None.  INDICATIONS FOR PROCEDURE:  The patient is a very nice 55 year old white male who presented with gross hematuria.  He subsequently underwent a full work-up, which consisted of IVP with tomography, CT scan of abdomen and pelvis and flexible cystourethroscopy and voiding cytology.  One cytology questionably positive, was worrisome for transitional cell carcinoma, so it was felt that further work-up was indicated.  Of note, on his CAT scan there were four small left renal calculi without obstruction.  Again, he understands risks, benefits and alternatives and wished to proceed.  DESCRIPTION OF PROCEDURE:  The patient was placed in supine position after proper general laryngeal airway anesthesia, he was prepped and draped with Betadine in sterile fashion and placed in the dorsal lithotomy position.  The meatus was somewhat tight and was dilated to 24 [place] with [employer] sounds. Cystourethroscopy was then performed with a 22.5 French Olympus panendoscope and utilizing the 12 and 70 degree lenses the bladder was carefully inspected and was noted to have mild trilobar hypertrophy.  Grade 1 trabeculation was noted.  Efflux of clear urine was  noted from normally placed ureteral orifices bilaterally and there was a bladder stone which was approximately 4 mm x 2 mm but was easily flushed out.  Utilizing a 6 French cone tip catheter, the catheter was placed in the right kidney first, then the left kidney. Barbotage renal pelvic cytology was performed bilaterally and dye was injected and retrograde ureteral pyelograms were performed bilaterally and noted to be within normal limits.  The stents were removed.  Barbotage bladder cytologies were obtained and submitted to pathology and biopsy of the bladder was performed posterior midline although there were no reddened areas noted and the base was cauterized with Bugbee coagulation cautery.  The bladder was drained.  The panendoscope was visually removed and the patient was taken to the recovery room stable. DD:  05/01/01 TD:  05/01/01 Job: [id number]

## 2011-02-18 NOTE — H&P (Signed)
[place]  Patient:    Russell Padilla, Russell Padilla                       MRN: [id number] Adm. 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  HISTORY OF PRESENT ILLNESS:  The patient is a 55 year old individual who has had significant problems with back and leg pain.  He was seen in January of 2001 where he developed the onset of back and right lower extremity pain sometime before the New Years Holiday.  He was on vacation at the time and developed pain into the thigh and into the calf.  He experienced some cramping in the calf.  He also notes that the pain would radiate to the bottom of the foot on occasion.  He had a herniated nucleus pulposus at the L3-4 level at that time with significant portion of the disk migrated behind the body of L4. He was advised regarding surgery, but held off and ultimately got some relief of his radiculopathy though he has had some chronic pain over the last year. 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  At L4-5 there is a small rent in the posterior angulus but no evidence of any significant nerve root compromise.  His symptoms consisted primarily of L4 radicular symptoms in the right lower extremity and he was advised regarding surgical extirpations and is being admitted for that procedure to include a diskectomy and arthrodesis.  PAST MEDICAL HISTORY:  Reveals that he has had some knee surgery on the left side in 1986.  His other health has been good.  He denies any  significant medical problems though he does have some reflux.  He has been on Prevacid in the past for that.  He has also been on a number of nonsteroidal antiinflammatories for the difficulties with his back.  FAMILY HISTORY:  Reveals that his mother is deceased.  Father is age 66 and in good health.  SOCIAL HISTORY:  Reveals that he is married.  He smokes about a pack of cigarettes a day since age 54.  He drinks alcohol on rare occasions.  His weight has been stable at 190 pounds and 5 feet 11.  REVIEW OF SYSTEMS:  Notable for difficulty with abdominal pain, indigestion, arm weakness, leg weakness, back pain, leg pain while walking.  PHYSICAL EXAMINATION:  GENERAL:  Physical examination reveals that he is an alert, cooperative individual, modest distress with his back.  NEUROLOGICAL:  He will tend to favor a 10 degree forward stoop.  He will straighten to the erect position.  He will not hyperextend.  His motor strength in his lower extremities reveals the iliopsoas and quads have good strength to confrontational testing.  Tibialis anterior and gastrocnemius are also normal. Deep tendon reflexes are 1+ in the left patella, absent in the right patella, 2+ in both Achilles, Babinskis are downgoing.  Straight leg raising is positive  on the right side at 30 degrees for pain down the posterior aspect of the thigh.  Patricks maneuver is negative bilaterally, left-sided straight leg raising is negative.  Upper extremity strength and reflexes are normal.  Cranial nerve examination is within the limits of normal.  NECK:  Reveals no masses and no bruits are heard.  HEART:  Regular rate and rhythm.  No murmurs are heard.  ABDOMEN:  Soft, bowel sounds are positive.  No masses are palpable.  EXTREMITIES:  Reveal no clubbing, cyanosis, or edema.  IMPRESSION:  The patient has evidence of significant disk herniation centrally at the L3-4 level.  He is now being admitted to undergo surgical  decompression and stabilization via posterior interbody fusion. DD:  12/07/00 TD:  12/08/00 Job: [id number]

## 2011-02-18 NOTE — Op Note (Signed)
NAME:  Russell Padilla, Russell Padilla NO.:  [id number]   MEDICAL RECORD NO.:  [id number]                   PATIENT TYPE:  INP   LOCATION:  5727                                 FACILITY:  [facility]   PHYSICIAN:  Abigail Miyamoto, M.D.              DATE OF BIRTH:  November 16, 1955   DATE OF PROCEDURE:  12/26/2003  DATE OF DISCHARGE:  12/27/2003                                 OPERATIVE REPORT   PREOPERATIVE DIAGNOSIS:  Acalculous cholecystitis.   POSTOPERATIVE DIAGNOSIS:  Acalculous cholecystitis.   PROCEDURE:  Laparoscopic cholecystectomy with intraoperative cholangiogram.   SURGEON:  Abigail Miyamoto, M.D.   ASSISTANT:  Velora Heckler, M.D.   ANESTHESIA:  General endotracheal anesthesia and 0.25% Marcaine.   ESTIMATED BLOOD LOSS:  Minimal.   INDICATIONS:  Russell Padilla is a 55 year old gentleman who presented with a  several-day history of acute epigastric and right upper quadrant abdominal  pain.  Ultrasound showed the patient to have a thick-walled gallbladder and  possible mildly dilated bile ducts.  A HIDA scan was performed which showed  non-visualization of the gallbladder and delayed secretion into the  duodenum.  The patient had normal liver function tests.  Given these  findings, the decision was made to proceed to the operating room.   FINDINGS:  The patient was found to have acute cholecystitis.  A  cholangiogram was performed.  No obstruction of the bile duct was seen.  However, the distal bile duct perhaps could have been strictured.   PROCEDURE DETAILS:  The patient was brought to the operating room,  identified as Russell Padilla.  He was positioned supine on the operating room  table, and general anesthesia was induced.  His abdomen was then prepped and  draped in the usual sterile fashion.  Using a #15 blade, a small vertical  incision was made just below the umbilicus.  This was carried down to the  fascia which was then opened with the scalpel.   Hemostats were used to pass  through to the peritoneal cavity.  Next, a 0 Vicryl pursestring suture was  placed around the fascial opening.  The Hasson port was placed through the  opening, and insufflation of the abdomen was begun.  A 12-mm port was then  placed in the patient's epigastrium, and two 5-mm ports were placed in the  patient's right flank under direct vision.  The gallbladder was then  identified and found to be acutely distended and inflamed.  It was grasped  and retracted above the liver bed.  Dissection was then carried out at the  base of the gallbladder.  Several adhesions to the gallbladder were taken  down both bluntly as well as with surgical clips.  The cystic duct and  cystic artery were then identified.  The artery was clipped proximally and  distally and transected.  A posterior branch likewise was clipped and  transected.  The critical window around  the cystic duct was then identified.  The cystic duct was then clipped once distally.  An Angiocatheter was then  inserted in the right upper quadrant under direct vision.  The  Cholangiocatheter was passed through this, and an opening was then made with  the scissors in the cystic duct.  The Cholangiocatheter was placed into the  cystic duct.  A cholangiogram was then performed under direct fluoroscopy  with contrast.  The contrast was seen to be flowing to the entire biliary  system, proximal radicles, and distal common bile duct and duodenum.  There  was no evidence of obstruction;  however, the distal bile duct appeared  mildly strictured.  At this point, the Cholangiocatheter was removed.  The  cystic duct was clipped three times proximally and transected with the  scissors.  The gallbladder was then slowly dissected free from the liver bed  with the electrocautery.  Hemostasis was achieved in the liver bed with the  cautery.  Once the gallbladder was removed from the liver bed, it was placed  in an EndoSac and  removed through the incision at the umbilicus.  The 0  Vicryl at the umbilicus was tied in place closing the fascial defect.  The  abdomen was then further irrigated with normal saline.  Again, hemostasis  appeared to be achieved.  All ports were removed under direct vision, and  the abdomen was deflated.  All incisions were anesthetized with 0.25%  Marcaine and closed with 4-0 Vicryl subcuticular sutures.  Steri-Strips,  gauze, and tape were then applied.  The patient tolerated the procedure  well.  All counts were correct at the end of the procedure.  The patient was  then extubated in the operating room and taken in stable condition to the  recovery room.                                               Abigail Miyamoto, M.D.    DB/MEDQ  D:  12/26/2003  T:  12/27/2003  Job:  [id number]

## 2011-02-18 NOTE — H&P (Signed)
NAME:  Russell Padilla, [facility] NO.:  [id number]   MEDICAL RECORD NO.:  [id number]                   PATIENT TYPE:  INP   LOCATION:  3017                                 FACILITY:  [facility]   PHYSICIAN:  Stefani Dama, M.D.               DATE OF BIRTH:  1956-07-01   DATE OF ADMISSION:  11/28/2003  DATE OF DISCHARGE:                                HISTORY & PHYSICAL   ADMITTING DIAGNOSES:  1. Intractable back pain, status post lumbar revision arthrodesis, L3-L4.  2. Intractable neck pain, status post revision arthrodesis with posterior     fixation, C5, C6 and C7.   HISTORY OF PRESENT ILLNESS:  Russell Padilla is a 55 year old individual who 2  weeks ago underwent revision arthrodesis in the cervical spine and in the  lumbar spine.  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  Postoperatively, the patient has had continued problems with significant and  quite substantial amounts of back pain and bilateral leg pain, neck pain and  shoulder pain on the left-hand side of his neck.  He has been treated  conservatively as an outpatient but because the pain has been so great, he  is now being admitted for further evaluation.  I had seen him in the office  yesterday and ordered a CT scan through the areas involved, as I was  suspecting there may be fracture of the hardware or loosening of the  hardware.  The CT scans that were performed on admission at this time  demonstrate that there is intact placement of the hardware in the cervical  spine from C5 to C7.  The posterior fixation appears to be quite  intact  also.  No fractures are noted in or around the hardware.  There are some  erosive changes on the endplates of the vertebrae at L3-L4, however, I am  not able to call this a frank infection, as the patient has recently had the  surgery to decorticate and re-pack bone graft in the posterior interbody  space.  He is now admitted to the hospital for further workup including pain  control with parenteral narcotic analgesics; oral narcotic analgesics had  barely been holding the patient, this with the use of significant muscle  relaxers.   PAST MEDICAL HISTORY:  His past medical history notes that he is a long-time  smoker and had some early changes of COPD that had been noted elsewhere.   SOCIAL HISTORY:  His social history reveals that he is married.  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   REVIEW OF SYSTEMS:  Systems review is notable for the intractability of the  back pain and neck pain but otherwise has been quite negative.   PHYSICAL EXAMINATION:  VITAL SIGNS:  Physical examination reveals that his  vital signs are stable at the current time with a blood pressure of 122/82,  heart rate 66 and regular, his respirations are 16 and unlabored.  GENERAL:  He is quite uncomfortable, in a significant amount of pain.  NECK/BACK:  His neck is in a collar.  Posterior neck incision appears to be  flat and healing nicely.  Posterior back incision appears to be flat and  healing nicely also.  There is no tenderness to palpation in the area  surrounding the muscle beds on either side, either in the neck or in the  back.  The neck reveals no masses and no bruits are heart.  NEUROMUSCULAR:  His motor strength in the upper extremities reveals his  deltoids, biceps, triceps, grips and intrinsics have good strength to  confrontational testing.  Deep tendon reflexes are 1+ in the biceps and  triceps, 1+ in the brachioradialis, 1+ in the patellae and the Achilles.  Strength in the lower extremities  is intact in the iliopsoas, quads,  tibialis anterior and the gastrocnemii.  Deep tendon reflexes are as noted.  Sensation is intact distally in the upper and lower extremities.  LUNGS:  The lungs are clear to auscultation.  HEART:  The heart has a regular rate and rhythm.  ABDOMEN:  The abdomen is soft.  Bowel sounds are positive.  No masses are  palpable.  EXTREMITIES:  Extremities reveal no cyanosis, clubbing or edema.  NEUROLOGIC:  The cranial nerve examination reveals the pupils are 4 mm,  briskly reactive to light and accommodation.  Extraocular movements are full  and the face is symmetric to grimace.  Tongue and uvula are in the midline.  EYES:  Sclerae and conjunctivae are clear.   IMPRESSION:  The patient has evidence of severe and unremitting back pain.  I am concerned that this may be an infectious process in his back.  I have  admitted the patient to undergo further blood testing including a CBC and a  sedimentation rate.  We will order blood cultures x2 from 2 separate sites.  If no obvious source is found, then he may need to undergo indium-labeled  white cell scanning or some other nuclear medicine test to identify the  source of infection.  I will proceed very cautiously.                                                Stefani Dama, M.D.    Merla Riches  D:  11/28/2003  T:  11/29/2003  Job:  [id number]

## 2011-02-18 NOTE — Discharge Summary (Signed)
NAME:  Russell Padilla, Russell Padilla NO.:  [id number]   MEDICAL RECORD NO.:  [id number]                   PATIENT TYPE:  INP   LOCATION:  3010                                 FACILITY:  [facility]   PHYSICIAN:  Stefani Dama, M.D.               DATE OF BIRTH:  1955-11-10   DATE OF ADMISSION:  11/13/2003  DATE OF DISCHARGE:  11/20/2003                                 DISCHARGE SUMMARY   ADMITTING DIAGNOSES:  1. Lumbar spondylosis and pseudoarthrosis, C5-6 and C6-7.  2. Pseudoarthrosis, L3-L4, with neck pain and back pain.  3. Cervical radiculopathy.   DISCHARGE AND FINAL DIAGNOSES:  1. Lumbar spondylosis and pseudoarthrosis, C5-6 and C6-7.  2. Pseudoarthrosis, L3-L4, with neck pain and back pain.  3. Cervical radiculopathy.   CONDITION ON DISCHARGE:  Stable.   HISTORY OF PRESENT ILLNESS:  Russell Padilla is a 55 year old individual who  has had significant neck and back pain.  He has had previous anterior  arthrodesis at C5-6 and C6-7 six months ago and then he had an arthrodesis  at L3-L4 done several years ago.  He developed increasing problems with neck  pain and back pain.  He was found to have developed pseudoarthrosis at each  of the levels, that at C5-6, C6-7 and also L3-L4, and it was felt that he  would need revision arthrodesis at each of these levels and he is being  admitted for this procedure now, to be done in a single-stage procedure with  a posterior supplemental fixation at C5 through C7 and a posterior revision  arthrodesis at L3-L4 with revision of pedicle screw fixation and  supplemental grafting using INFUSE bone matrix .   HOSPITAL COURSE:  The patient was taken to the operating room, where he  underwent a revision arthrodesis in the back of the neck and also in the  lumbar spine at L3-L4.  He had replacement pedicle screw fixation at L3-L4.  Postoperatively, the patient had considerable difficulties with pain.  The  pain was most severe during  his first week's time and he received treatment  with IV narcotic analgesics and then was gradually switched over to  Percocet.  He had significant difficulty with constipation also; his bowels  were able to ultimately get going near the end of his hospitalization.  During the hospitalization, his incision remained clean and dry.  There was  some swelling in the posterior lumbar incision which was felt to be  secondary to a small hematoma.  He was ambulating with the use of a cane at  the time of his discharge and was given prescription for Percocet for pain  in addition to significant narcotic analgesics to take during the first  several weeks.  He also was placed on Valium and on Flexeril and muscle  relaxers.   FOLLOWUP:  He will be seen in the office in approximately 2 weeks' time for  further followup.                                                Stefani Dama, M.D.    Merla Riches  D:  01/01/2004  T:  01/02/2004  Job:  [id number]

## 2011-02-18 NOTE — Op Note (Signed)
NAME:  TERRIAN, SENTELL NO.:  [id number]   MEDICAL RECORD NO.:  [id number]                   PATIENT TYPE:  INP   LOCATION:  3010                                 FACILITY:  [facility]   PHYSICIAN:  Stefani Dama, M.D.               DATE OF BIRTH:  1956/05/02   DATE OF PROCEDURE:  11/13/2003  DATE OF DISCHARGE:                                 OPERATIVE REPORT   PREOPERATIVE DIAGNOSIS:  Pseudarthrosis, C5-6 and C6-7 with cervicalgia and  cervical radiculopathy.   POSTOPERATIVE DIAGNOSIS:  Pseudarthrosis, C5-6 and C6-7 with cervicalgia and  cervical radiculopathy.   PROCEDURE:  Supplemental fixation, C5, C6, and C7 with facetal screws and  infused bone graft for stabilization of cervical spine, C5-C7.   SURGEON:  Stefani Dama, M.D.   FIRST ASSISTANT:  Clydene Fake, M.D.   ANESTHESIA:  General endotracheal.   INDICATIONS:  Mr. Homar Weinkauf is a 55 year old individual who has had a  previous anterior cervical diskectomy and arthrodesis of C5-6 and C6-7  performed in April of 2004.  The patient seemed to be getting better;  however, he has had persistent problems with neck pain which became acutely  worse in the past several weeks before admission.  He has failed efforts at  conservative management and immobilization and was noted to have evidence of  a pseudarthrosis at the level of C5-6 and C6-7.  Furthermore, there was some  loosening of the hardware.  It was felt that having failed efforts at  external immobilization, he would require supplemental arthrodesis.  He is  now taken to the operating room for this procedure.  This is part of a  staged procedure being done with the posterior operation in the lumbar spine  for a pseudarthrosis at the L3-L4 level there also.   PROCEDURE:  The patient was brought to the operating room supine on the  stretcher after the smooth induction of general endotracheal anesthesia.  He  was placed carefully on the  operating table in the prone position with his  head being secured in the three pin head rest.  The back of the neck was  prepped with Duraprep and draped in a sterile fashion.  A midline incision  was created over the midportion of the cervical spine.  This was carried  down through the cervical dorsal fascia, which was then opened to expose the  first spinous process, which was noted to be that of C5.  Dissection was  then carried slightly inferiorly to expose C5-6 and C6-7 in the interlaminar  spaces and the lamina of these three vertebrae.  A self-retaining retractor  was placed deep in the wound. The dissection was carried out to the lateral  aspect of the facet joints themselves.  Once all of the bony landmarks had  been secured, facetal screws were placed into the facets at the C5, C6, and  C7  levels.  The entry landmark was chosen a mm medial and a mm inferior to  the midpoint of the facet itself, and the direction of approximately 30  degrees cephalad and 30 degrees lateral was chosen as a trajectory.  14 mm  screws were chosen, and the 14 mm pallet hole was drilled in each of the  facets.  Once this was accomplished, the bony margins were then  decorticated, including opening of the facet joint using a high-speed air  drill and 2.3 mm dissecting tool.  The dissection was then carried out  laterally to decorticate as well as possible in the facet joints and the  interlaminar space.  Once this had been accomplished, the facet screws were  placed at C5, C6, and C7.  Appropriate-sized rod was contoured between the  screw heads, and then these were provisionally tightened in position.  An  attempt at radiology was made that could be localized to the C5 level  superiorly, and in the AP projection, the rods and the screws appeared to be  in appropriate locations.  The facets having been decorticated, a packet of  the small size of infused bone graft was prepared and allowed to set for  the  appropriate 15 minutes to allow the bone graft to be prepared, and then the  sheaths were cut in the appropriate sizes and shapes to pack small pledgets  into the facet joints themselves and to pack a sheath of infused graft over  the lamina from C5 to C7.  A strip was also placed lateral to the facet  screws, and this was gently packed around the facet screws and the construct  itself.  Once this was accomplished, then the cervicodorsal fascia was  reapproximated into the midline and closed with #1 Vicryl.  2-0 Vicryl was  used subcutaneously and subcuticularly was closed with 3-0 Vicryl.  Dermabond was placed on the skin.  The patient was then maintained under  anesthesia for the second portion of the procedure, which was done in the  lumbar spine to revise the L3-4 pseudarthrosis.                                               Stefani Dama, M.D.    Merla Riches  D:  11/14/2003  T:  11/14/2003  Job:  [id number]

## 2011-02-18 NOTE — Op Note (Signed)
NAME:  [name], [name] NO.:  [id number]   MEDICAL RECORD NO.:  [id number]          PATIENT TYPE:  AMB   LOCATION:  SDS                          FACILITY:  [facility]   PHYSICIAN:  Stefani Dama, M.D.  DATE OF BIRTH:  1956/09/30   DATE OF PROCEDURE:  07/01/2005  DATE OF DISCHARGE:                                 OPERATIVE REPORT   PREOPERATIVE DIAGNOSIS:  Expired implanted fusion stimulator, subcutaneous  mass.   POSTOPERATIVE DIAGNOSIS:  Expired implanted fusion stimulator, subcutaneous  mass.   PROCEDURE:  Removal of battery pack from fusion stimulator.   SURGEON:  Stefani Dama, M.D.   ANESTHESIA:  MAC and local, 10 mL of 1% lidocaine with epinephrine mixed  50/50 with 0.5% Marcaine.   INDICATIONS:  Russell Padilla is a 55 year old individual who has had  significant back pain in the past and has had a lumbar arthrodesis and a  pseudoarthrosis that was revised with the use of an implantable fusion  stimulator.  The battery pack was placed in the subcutaneous space and has  long since expired.  Russell Padilla complains intermittently of significant back  pain secondary to the mass of the battery pack.  He is now taken to the  operating room to have the battery pack removed.   PROCEDURE:  The patient was brought to the operating room and placed on  table in the left lateral decubitus position.  The back was prepped with  DuraPrep overlying the region of the mass and it was draped out sterilely.  The area above the mass was infiltrated with 10 mL of the anesthetic  solution and then a vertical incision was made directly over the mass.  The  dissection was carried down to the outer fascia over the battery pack; this  was opened with monopolar cautery and after some dissection, the battery  pack was brought into the subcutaneous space and then brought out of the  incision.  The attached wires were then pulled swiftly and this caused them  to tear.  The wires were then  cut at the very proximal end in the deepest  portion of the incision and the battery pack was removed.  The incision was  irrigated with antibiotic irrigating solution.  Subcutaneous closure was  performed with 3-0 Vicryl and subcuticular tissue was closed with 3-0 Vicryl  also.  Dermabond was applied to the skin.  Blood loss was minimal.      Stefani Dama, M.D.  Electronically Signed     [id number]/[id number]  D:  07/01/2005  T:  07/01/2005  Job:  [id number]

## 2011-02-18 NOTE — Discharge Summary (Signed)
[place]  Patient:    Russell Padilla, Russell Padilla                       MRN: [id number] Adm. Date:  [id number] Disc. Date: [id number] Attending:  Jonne Ply                           Discharge Summary  HISTORY OF PRESENT ILLNESS:  The patient is a 55 year old man with back and leg pain.  He had a central L3-4 disk herniation.  PHYSICAL EXAMINATION:  GENERAL:  Unremarkable.  NEUROLOGIC:  Intact strength.  HOSPITAL COURSE:  The patient was admitted, underwent an L3-4 diskectomy and fusion by Dr. Danielle Dess.  He has done well following surgery, with a moderate amount of pain.  He has had some postoperative fever.  He was seen in consultation by physical therapy and occupational therapy.  He is, at this point, up and ambulating.  He had some difficulties with constipation, but that has now since resolved.  His wound is healing well.  He is asking to be discharged.  Instructions were given.  He has been given prescriptions for Percocet 1-2 tablets p.o. q.4-6h. p.r.n., 40 tablets prescribed, no refills, and Valium 5 mg t.i.d. p.r.n. muscle spasms, 30 tablets prescribed, no refills.  He is to return to see Dr. Danielle Dess in a couple of weeks with AP and lateral lumbar spine x-rays.  He is to let us know if he needs anything else in the meantime.  DISCHARGE DIAGNOSIS:  Lumbar disk herniation. DD:  12/10/00 TD:  12/11/00 Job: [id number]

## 2011-02-18 NOTE — Discharge Summary (Signed)
NAME:  Russell Padilla, SPIEWAK NO.:  [id number]   MEDICAL RECORD NO.:  [id number]                   PATIENT TYPE:  INP   LOCATION:  3017                                 FACILITY:  [facility]   PHYSICIAN:  Stefani Dama, M.D.               DATE OF BIRTH:  1956-08-02   DATE OF ADMISSION:  11/28/2003  DATE OF DISCHARGE:  12/02/2003                                 DISCHARGE SUMMARY   ADMISSION DIAGNOSES:  1. Intractable back pain status post lumbar revision arthrodesis L3-L4.  2. Intractable back pain status post supplemental fixation and arthrodesis     of pseudoarthrosis C5, C6, C7.   CONDITION ON DISCHARGE:  Improving.   HOSPITAL COURSE:  Russell Padilla is a 55 year old individual who has had  significant problems with back pain and neck pain status post a revision  arthrodesis that was performed a little over two weeks ago. He was  discharged home, but had a significant amount of pain and was maintained on  significant doses of oral Percocet for pain control. The patient was not on  a nonsteroidal anti-inflammatory. His condition was deteriorating and the  pain medication was not holding him adequately. He was evaluated in the  office the day before admission and it was suggested that further workup  should be performed to check stabilization of his implanted hardware in his  lumbar spine and his cervical spine. On the day of admission he had this  study performed, but because the pain was so unbearable and he had traveled  from out of town it was advised that he be admitted to the hospital for pain  control. He was started on a PCA pump. Scans revealed that his hardware was  in good position. There was no loosening and no displacement of the  hardware. It was felt that because of the severity of the pain that he would  require further workup to rule out infection. His white count was noted to  be normal on his initial blood studies, however, his initial sed. rate  was  noted to be 40. A repeated sed. rate was noted to come back at 18. C.  reactive protein was noted to be elevated at 1.9. The patient was not  started on antibiotics. Blood cultures that were drawn did not reveal any  growth. His incision remained clean and dry. There were no outward signs of  infection either in the neck or the back. However, the patient was started  on Toradol. He seemed to respond to this and within 24 hours defervesced  considerably. By 48 hours he was feeling considerably better and was able to  be switched to oral pain medicines and he tolerated this on the third day of  his hospitalization. At this time the patient is discharged to home with a  prescription for oral Toradol for five days. He will be followed up by  taking Bextra 10 mg twice a day. He will be given a prescription for  Percocet as needed for pain, but has been advised to wean this as rapidly as  possible. He was also given a prescription for Flexeril. He is ambulatory.  He complains of some left lumbar radicular type pain in his lower extremity.  He had some left cervical pain. Hopefully these will resolve with further  passage of time.   CONDITION ON DISCHARGE:  Improved.   FINAL DIAGNOSES:  1. Intractable back pain status post lumbar revision arthrodesis L3-L4.  2. Intractable back pain status post supplemental fixation and arthrodesis     of pseudoarthrosis C5, C6, C7.                                                Stefani Dama, M.D.   Merla Riches  D:  12/02/2003  T:  12/03/2003  Job:  [id number]

## 2011-02-18 NOTE — Op Note (Signed)
NAME:  Russell Padilla, Russell Padilla NO.:  [id number]   MEDICAL RECORD NO.:  [id number]                   PATIENT TYPE:  INP   LOCATION:  3027                                 FACILITY:  [facility]   PHYSICIAN:  Stefani Dama, M.D.               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   OPERATION:  1. Bilateral diskectomy L4-5.  2. Posterior lumbar interbody fusion with Alphatrex Peek cages, 12 mm in     height.  Revision of pedicular fixation from L3-4 to L3, L4, L5.     Posterolateral arthrodesis with iliac crest bone graft and from the right     posterior iliac crest.  Implantation of EBI bone fusion stimulator.   SURGEON:  Stefani Dama, M.D.   ASSISTANT:  Hilda Lias, M.D.   ANESTHESIA:  General endotracheal.   INDICATIONS:  The patient is a 55 year old individual who has had quite  severe back pain and leg pain.  He has been having worsening of the pain  symptoms over the past number of weeks and a myelogram was performed last  week which demonstrated a high-grade stenosis at the level of L4-5.  After  careful consideration, he was advised that he would require surgical  decompression and fusion L4-5.  This had been previously contemplated.  The  patient is taken to the operating room now to undergo fusion at L4-5 with  revision from the L3-4 area.   DESCRIPTION OF PROCEDURE:  The patient was brought to the operating room  supine on the stretcher.  After the smooth induction of general endotracheal  anesthesia, he was turned prone.  Back was shaved, prepped with Duraprep,  and draped in a sterile fashion.  His previously made midline incision was  reopened carefully and dissection was carried down to the lumbodorsal  fascia.  The fascia was opened on either side of the midline to expose the  spinous processes of L3-4 which were noted to be fairly rigid and immobile  and then L4-5 which was noted to be normally mobile.  Dissection then  continued to expose the hardware at L3-4 and lateral gutters at L4-5 out to  the transverse processes.  These lateral areas were then packed off.  The  procedure was then continued with exposure of the L4-5 disk space.  There  was noted to be a substantial amount of epidural fibrosis.  Within the  fibrosis, a pus-like material was noted to exude itself.  Cultures of this  material were obtained in addition to  sending a stat Gram's stain which did  not reveal any organisms but polymorph nuclear cells.  The material was in  association with some biologic adhesive in the form of BioGlue which had  been placed previously over the laminotomy sites.  This material was excised  and peeled separately from the dura and scar overlying the dura immediately.  In the end, the common dural tubes and the L5 nerve roots were well  skeletonized and easily visualized.  They were retracted medially over a  significant central mass which was a disk herniation in the subligamentous  space.  Then first on the right side the disk space was entered with a #15  blade being used to open the disk space.  A diskectomy was then performed  removing a substantial quantity of severely degenerated disk material at the  L4-5 space.  Once this area was evacuated diskectomy was performed on the  opposite site.  Laminotomies were enlarged to remove the inferior margin of  the facets from L4.  This allowed good visualization of the disk space and  also access to allow for placement of interbody grafts which were ultimately  placed after the disk was curetted free of  any end-plate material and the  vast majority of the disk was removed.  Once this was accomplished, the  space was sized and it was felt 12-mm spacers would fit nicely into the  interspace.  Pedicle entry sites were then chosen to L5 and these were done  with a blunt probe instrument and direct visualization along the medial and  inferior aspect of the pedicle.  No cutout was found;  6.5 x 50-mm screws  were placed into the L5 vertebra.  Then, attention was turned to the right  posterior superior iliac crest and here, through a separate fascial incision  from the primary incision, the iliac crest was incised and opened and then  using a combination of curets and rongeurs the bone graft was harvested from  the right posterior superior iliac crest.  A Taylor retractor was used to  retract the gluteus muscle and the bone graft was harvested in a no-touch  technique.  In the end, the incision was closed with 0 Vicryl in interrupted  fashion.  The subcutaneous pocket was then passed out superior to the iliac  crest for placement of the bone fusion stimulator.  The bone was then packed  into 12-mm cages which were then inserted into the disk space.  Cancellous  bone was used for this purpose.  Cancellous bone was also packed into the  interspace separately.  Then, with the EBI fusion stimulator being placed  into the pocket, each electrode was placed into the lateral gutter from L5  and then passed towards the medial aspect of L4 and then into the medial  posterior space from L3 down to L4.  Along with this, bone graft was placed  into these regions to cover and separate the fusion stimulator from any  metal objects.  This was first done on the right side and then performed on  the left side.  The pedicle screws were being placed at L5.  Caps were  applied and a 65-mm precontoured rod was then fitted, first on the right side, and then on the left side.  The rods were provisionally tightened at   L3 and L4 and then a compression construct was performed at L5.  Once this  was completely tightened down, the construct was inspected under both  radiographic images which identified good position of the pedicle screws,  good overall alignment of the vertebra.  The remainder of the graft was then  packed into the interspace and then into the posterolateral gutters.  Then  the wound was inspected carefully, irrigated, and then closed with #1 Vicryl  in the lumbodorsal fascia, 2-0 Vicryl subcutaneously, and 3-0 Vicryl  subcuticularly.  The patient tolerated the procedure well.  Estimated blood  loss 850 mL.  He was returned to the recovery room in stable condition.                                               Stefani Dama, M.D.    Merla Riches  D:  03/11/2004  T:  03/12/2004  Job:  [id number]
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
## 2011-02-18 NOTE — H&P (Signed)
NAME:  Russell Padilla, Russell Padilla NO.:  [id number]   MEDICAL RECORD NO.:  [id number]                   PATIENT TYPE:  INP   LOCATION:  5727                                 FACILITY:  [facility]   PHYSICIAN:  Abigail Miyamoto, M.D.              DATE OF BIRTH:  March 04, 1956   DATE OF ADMISSION:  12/26/2003  DATE OF DISCHARGE:  12/27/2003                                HISTORY & PHYSICAL   CHIEF COMPLAINT:  Abdominal pain.   HISTORY:  This is a pleasant 55 year old gentleman who is about a month and  a half status post fusion of his cervical and lumbar spine.  He developed  epigastric and right upper quadrant abdominal pain approximately two days  ago.  He has been slowly getting worse and today described a 9/10 pain which  was sharp and burning as well.  He has had nausea with this, but no emesis.  He has been moving his bowels well, but is having some difficulty passing  his urine.  He has had no previous history of similar abdominal pain.  He  denies chest pain or shortness of breath.  He has had subjective fevers and  chills.  He has had no jaundice.   PAST MEDICAL HISTORY:  Past medical history significant for hyperlipidemia.  Kidney stones.   PAST SURGICAL HISTORY:  Past surgical history again includes the cervical  and lower back surgery which has been twice by Dr. Barnett Abu.   MEDICATIONS:  Neurontin, Percocet, Celebrex, Lipitor.   ALLERGIES:  No known drug allergies.   SOCIAL HISTORY:  He used to smoke in the past, but no longer does.  He does  not drink alcohol.   FAMILY HISTORY:  Family history is significant for his mother having died in  her 68s of gastric cancer.   REVIEW OF SYSTEMS:  Review of systems is otherwise unremarkable.   PHYSICAL EXAMINATION:  GENERAL:  Physical examination reveals a well  developed, well nourished gentleman in a soft cervical collar, lying in bed  in mild discomfort.  VITAL SIGNS:  Temperature is 99.8, pulse is 91,  respiratory rate is 20,  blood pressure is 137/74.  EYES:  He is anicteric.  Pupils reactive bilaterally.  EARS, NOSE, MOUTH AND THROAT:  External ears and nose are normal.  Hearing  is normal.  Oropharynx  is clear.  NECK:  Supple.  There is no cervical adenopathy.  There is no thyromegaly.  LUNGS:  Clear to auscultation bilaterally with normal respiratory effort.  CARDIOVASCULAR:  Regular rate and rhythm.  No murmurs.  There is no  peripheral edema.  ABDOMEN:  Soft.  Tenderness in the epigastrium and right upper quadrant.  There are no masses.  There are no hernias.  There is no organomegaly.  There is moderate guarding.  EXTREMITIES:  Warm and well profused.  There is no cyanosis, clubbing or  edema.  NEUROLOGICAL:  Non-focal.   LABORATORY DATA:  The patient has normal liver function tests with a  bilirubin of 0.07, alkaline phosphatase 76, AST of 19, and ALT of 18.  BUN  and creatinine are 12 and 1.2.  The patient has a white count of 7.5 and a  hemoglobin of 12.7.  There are 82% neutrophils on the CBC.  Urinalysis shows  rare bacteria and 7 to 10 red blood cells.   X-RAY DATA:  The patient has an ultrasound showing to have mildly dilated  intra and extrahepatic biliary ducts.  The gallbladder wall was thickened.  There are no stones.  There is a question of a possible distal bile duct  narrowing or obstruction.  The patient has a HIDA scan which is positive  showing non-visualization of the gallbladder.  There is also slow excretion  into the duodenum.   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  At this point, cholecystectomy is recommended.  I will also  do a cholangiogram at the time.  I discussed the laparoscopic approach with  the patient and his wife.  We discussed the risks of surgery including  bleeding, infection, common bile duct injury, bile leak, need for open  surgery, etc., and at this point, he wished to proceed.  Surgery of  this  will be scheduled for as soon as possible.                                                Abigail Miyamoto, M.D.    DB/MEDQ  D:  12/26/2003  T:  12/27/2003  Job:  [id number]

## 2011-02-18 NOTE — Discharge Summary (Signed)
NAME:  Russell Padilla, Russell Padilla NO.:  [id number]   MEDICAL RECORD NO.:  [id number]                   PATIENT TYPE:  INP   LOCATION:  3027                                 FACILITY:  [facility]   PHYSICIAN:  Payton Doughty, M.D.                   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   PROCEDURE:  L4-5 posterior lumbar interbody fusion procedure with Alphatec  Peek cages, revision of pedicle fixation to L3-4, 4-5, posterolateral  arthrodesis from right iliac crest bone graft.  We also placed a bone  stimulator.   COMPLICATIONS:  None.   DISCHARGE STATUS:  Alive and well.   BODY OF TEXT:  [age]-year-old gentleman whose history and physical are  recounted in the chart.  He has had several back operations in the past and  has had increasing back and right hip pain.  He was taken to the operating  room and underwent the above-mentioned procedure.  Postoperatively, he has  done well.  His strength is full in his lower extremities.  He had Dilaudid  PCA for a couple of days, which was discontinued, Foley was removed.  He is  up, eating and voiding normally now.  He did well in physical therapy.  He  is being discharged home to the care of his family and followup will be at  the [facility] in about 2 weeks with Dr. Stefani Dama.   DISCHARGE MEDICATIONS:  He is being given Percocet and Flexeril.                                                Payton Doughty, M.D.    MWR/MEDQ  D:  03/15/2004  T:  03/15/2004  Job:  [id number]

## 2011-02-18 NOTE — Op Note (Signed)
NAME:  Russell Padilla, Russell Padilla                          ACCOUNT NO.:  [id number]   MEDICAL RECORD NO.:  [id number]                   PATIENT TYPE:  INP   LOCATION:  3010                                 FACILITY:  [facility]   PHYSICIAN:  Stefani Dama, M.D.               DATE OF BIRTH:  1956/07/24   DATE OF PROCEDURE:  11/13/2003  DATE OF DISCHARGE:                                 OPERATIVE REPORT   PREOPERATIVE DIAGNOSIS:  Pseudoarthrosis L3-L4, spondylosis and stenosis L4-  L5.   POSTOPERATIVE DIAGNOSIS:  Pseudoarthrosis L3-L4, spondylosis and stenosis L4-  L5.   PROCEDURE:  Revision of pseudoarthrosis L3-L4 with pedicular fixation,  posterior interbody fusion with carbon fiber cage and Infuse graft,  posterolateral arthrodesis with OPI graft and allograft and local autograft,  laminotomies, and foraminotomies L4-L5, with operating microscope  microdissection technique.   SURGEON:  Stefani Dama, M.D.   FIRST ASSISTANT:  Clydene Fake, M.D.   ANESTHESIA:  General endotracheal anesthesia.   INDICATIONS FOR PROCEDURE:  Russell Padilla is a 55 year old individual who  has had significant problems with back pain.  Three years ago, he had a  severely degenerated disc at the L3-L4 level having sustained a disc  herniation some years before that that was treated conservatively.  He had  severe, unrelenting back pain and was advised regarding surgical  decompression of the L3-L4 level in 2001.  He seemed to heal well after the  surgery, however, he has kept some chronic back pain and pain radiating into  the region of the buttock and hip on the left side.  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  He was advised regarding surgical  decompression of the  L4-L5 level and revision of arthrodesis and is now  taken to the operating room as the second part of a staged procedure to  revise or supplement the arthrodesis at the cervical spine and revise the  fusion in the lumbar spine.   PROCEDURE:  The patient was brought to the operating room and positioned in  a prone position with his head in the three pin headrest.  The cervical  procedure had already been completed.  The patient was slightly repositioned  with the arms extended at the sides for the lumbar portions and his hips  were placed on small bolsters to allow for alleviation of pressure points.  The back was prepped  with DuraPrep and then draped in a sterile fashion.  An elliptical incision was made around his previous scar and this was  excised.  Dissection was carried down to the lumbodorsal fascia which was  opened on either side of the midline to expose the  L3-L4 region.  The  lateral aspect of the hardware which was AcroMed VSP hardware was identified  and this was then removed.  The screws were noted to be significantly loose  and the spinous processes were noted to splay easily indicating that,  indeed, there was a florid pseudoarthrosis at the level of L3-L4.  Once the  hardware was removed, it was noted that the screw holes had been filled with  the material around the screws which made the interspace loose.  Dissection  was carried down first on the right side to expose the spinous processes and  the interlaminar space on the right side and then the facet joint which had  been largely removed was explored and then the transverse processes were  explored, decorticated, and then packed out to the right side.  On the left  side, a similar procedure was then carried out.  However, here the  intralaminar space was noted to have persistence of the yellow ligament.  This was ultimately take out and the common dural tube was explored on the  left side and the disc space was still  intact as the patient had had a TLIF  bone graft placed on the right side at his first operation.  Then, by  packing off the lateral aspect of the aspect of the transverse processes  which were decorticated, a discectomy was performed from that left side and  the disc space was opened substantially.  This allowed for distraction from  the contralateral side and the space was opened even more and dissection was  carried out in the disc space to allow for decortication of the interbody  space again.  There was noted to be some material that was obtained from the  contralateral side, however, opening of the right side of the interspace was  somewhat difficult because of the dense, adherent, fibrous tissue in this  region.  This was not attempted further.  The disc space was then prepared  for grafting using a combination of curets, rongeurs, and rasps to evacuate  the disc space completely and prepare the endplates as an appropriate  grafting surface.  A 9 mm trial of PLIF bone spacer for a carbon fiber cage  was used on that side.  This was from the Dupuy system and once this was  sized and felt to be appropriate, it was laid aside for later insertion.   The attention was turned to the L4-L5 space.  The interlaminar space was  uncovered and this area was opened with 2 and 3 mm Kerrison punches being  used to remove the yellow ligament, explore the common dural tube, and  perform foraminotomies over the L5 nerve root.  The disc was only noted to  be slightly bulging.  The ligament was noted to be completely intact and  was, therefore, not violated.  On the left side, there was a ventral small  pin hole of a leak of spinal fluid and this was covered over with biologic  adhesive.  Once the procedure was completed, the laminotomies and  foraminotomies were completed with the use of the operating microscope and  during the entire procedure, I had help from Dr. Phoebe Perch at the appropriate stages for  appropriate retraction of the common dural tube, protection of  the nerve root, and to assist with the decompression, itself.  Once this was  accomplished at L4-L5, attention was turned back to the fusion where pedicle  screws were chosen appropriately.  It  was felt that because of the  enlargement of the holes from the previous loosening of the hardware that  had occurred around those holes, a type of hardware with a large minor  diameter to the screw would be appropriate.  This was best found in the  Synthes PLIF screws and 8 mm screws were then placed at L3 on the right and  L4 on the left and 7 mm screws measuring 40 and 45 mm appropriately were  placed on L3 on the left and L4 on the right.  With this, then, the screw  heads were reamed appropriately and the caps were placed.  The system was  fixed in the neutral position after an interbody graft was tapped into the  interspace on the left side.  The carbon fiber cage was filled with Infuse  bone graft and Infuse bone graft was also placed into the interspace.  This  was supplemented with Master graft.  Once the system was locked down in  place, Infuse graft was then placed in the intertransverse on the left side  and the right side with a sheet at each level and the posterior interlaminar  space was also filled with a sheet of Infuse graft.  Master graft was then  used to supplement this, particularly on the right side, in the  intertransverse nasd interlaminar space.  Once the graft was all laid down,  the system was checked radiographically in the AP and lateral projections.  The alignment was noted to be quite good and physiologic.  At this point,  closure was undertaken by reapproximating the lumbodorsal fascia and closing  with #1 Vicryl, 2-0 Vicryl was used in the subcutaneous and subcuticular  tissues, 3-0 Vicryl was used to close the subcuticularly.  The patient  tolerated the procedure well and was returned to the recovery  room in stable  condition.                                               Stefani Dama, M.D.    Merla Riches  D:  11/14/2003  T:  11/14/2003  Job:  [id number]

## 2011-02-18 NOTE — Op Note (Signed)
[facility]  Patient:    Russell Padilla, Russell Padilla                       MRN: [id number] Proc. Date: 12/07/00 Adm. Date:  [id number] Attending:  Jonne Ply                           Operative Report  PREOPERATIVE DIAGNOSIS:  L3-4 herniated nucleus pulposus with bilateral lumbar radiculopathy and central spinal canal stenosis.  POSTOPERATIVE DIAGNOSIS:  L3-4 herniated nucleus pulposus with bilateral lumbar radiculopathy and central spinal canal stenosis.  OPERATION PERFORMED:  Left hemilaminectomy L3-4, total diskectomy, posterior interbody arthrodesis with TLIF bone spacer, pedicle fixation, L3-L4 with [employer] VSP instrumentation, local autograft and allograft using Helious bone matrix.  SURGEON:  Stefani Dama, M.D.  ASSISTANT:  ANESTHESIA:  General.  INDICATIONS FOR PROCEDURE:  The patient is a  55 year old individual who has had bilateral lumbar radiculopathy for greater than a years period of time. He has a large central disk herniation at the L3-4 level.  He is advised regarding surgery.  DESCRIPTION OF PROCEDURE:  The patient was brought to the operating room supine on the stretcher.  After smooth induction of general endotracheal anesthesia, he was turned prone, the back was shaved, prepped with DuraPrep and draped in sterile fashion.  A midline incision was created and carried down to the lumbodorsal fascia and the first identifiable spinous process was noted to be that of L4.  Dissection was then carried cephalad to expose L3 bilaterally out to the facet joints.  Hemilaminectomy was then created on the right side at L4.  The L3-4 interspace was exposed.  The L4 nerve root was exposed and its path was decompressed.  The superior aspect of the L3 nerve root was identified.  The disk space was noted to be bulging posteriorly significantly.  It was incised with a 15 blade using care to retract the dura out of the way and the disk space  was then evacuated of a significant quantity of markedly degenerated disk material.  The disk space curets were then used to shape and remove any residual material from the end plates on the disk space so as to allow good bony contact.  A distractor was placed on the contralateral side.  Once the disk space was cleared, a sizer for the TLIF graft was placed and this held a 13 mm tall graft.  The 13 mm graft was then opened and presoaked for the appropriate length of time and then was tamped into place ventrally.  Then pedicle entry sites were chosen at L3 and L4.  In all four holes standard 6.25 x 40 mm ____________ VSP screws were placed.  A short blade was then attached between these with an angled washer being placed on the L4 screws.  The screws were locked down into position and localizing radiograph identified good fixation of the L3-4 space with some slight lordosis.  The interspace on the left side was then decorticated.  The Helios matrix was soaked in an aspirate of bone marrow from the L4 vertebra that was obtained with the pedicle entry sites were chosen and then the aspirate was allowed to set in the pieces of structured Gelfoam.  The Helios bone matrix was then placed into the interspace along with some of the local autograft that had been harvested from the hemilaminectomy.  The left side was then packed  with a piece of Helios and then the right side was packed with a largerstrip of Helios over the laminectomy defect.  The area was then checked for hemostasis in the soft tissues.  The remaining bone graft was placed on the left lumbar spine in the interlaminar space and then the screw heads after being tightened down finally were cut.  The retractors were then removed and the lumbodorsal fascia was closed with #1 Vicryl in interrupted fashion.  2-0 Vicryl was used in the subcutaneous tissues and 3-0 Vicryl was used subcuticularly to close the skin.  The patient tolerated the  procedure well. Blood loss was estimated at 750 cc. DD:  12/07/00 TD:  12/08/00 Job: [id number]

## 2011-02-18 NOTE — H&P (Signed)
NAME:  Russell Padilla, Russell Padilla NO.:  [id number]   MEDICAL RECORD NO.:  [id number]                   PATIENT TYPE:  INP   LOCATION:  3010                                 FACILITY:  [facility]   PHYSICIAN:  Stefani Dama, M.D.               DATE OF BIRTH:  08-26-1956   DATE OF ADMISSION:  11/13/2003  DATE OF DISCHARGE:                                HISTORY & PHYSICAL   ADMISSION DIAGNOSES:  1. Pseudoarthrosis, C5-6 and C6-7, status post anterior decompression and     arthrodesis in April, 2004.  2. Pseudoarthrosis, L3-4, status post decompression arthrodesis in March,     2002 secondary to herniated nucleus pulposus, lumbar radiculopathy, and     lumbar spondylosis.  3. Lumbar stenosis at L4-5.   HISTORY OF PRESENT ILLNESS:  Patient is a 55 year old individual who has had  significant problems with both back and leg pain in addition to significant  neck pain and left shoulder and arm pain.  He underwent the above-named  procedures with a cervical procedure being performed about a year ago.  Recent x-rays reveal that he has evidence of pseudoarthrosis at the level of  C5-6 and C6-7 with evidence of function there.  A myelogram has recently  been performed, which also then disclosed that he has pseudoarthrosis at the  level of L3-4, where he underwent an arthrodesis in March, 2002.  The  patient has been advised regarding the findings because of significant  increase in pain and disability.  Because of the difficulties in the  cervical and the lumbar spine, he is being admitted to the hospital to be  taken back for revision of the lumbar fusion posteriorly and supplemental  fixation of the cervical fusion via posterior approach, from C5-7.   Past medical history reveals that he has had some surgeries on his left side  in 1986.  His other health has been good.   He denies any chronic medical problems, although he does have some history  of reflux.  He has been on  Prevacid in the past.  He has also been on a  number of nonsteroidal anti-inflammatories for difficulties with his back  and his neck.   He does have a history of tobacco abuse, and this has been discussed as a  possible risk factor and causing pseudoarthroses.  He has been advised  regarding smoking cessation.   FAMILY HISTORY:  His mother is deceased.  His father is in his late 55s and  has been in good health.   SOCIAL HISTORY:  He is married.  He does smoke a pack of cigarettes a day.  He drinks alcohol on rare occasions.  His height and weight have been stable  at 190 and 5 feet 11.   REVIEW OF SYSTEMS:  Notable in that he has some difficulty with chronic  constipation on a 14-point review sheet in addition to  the musculoskeletal  difficulties in the arms, legs, and the back.   PHYSICAL EXAMINATION:  GENERAL:  He is an alert, oriented and cooperative  individual in no overt distress.  He stands with a 10-degree forward stoop  and difficulty maintaining erect posture.  MUSCULOSKELETAL:  His motor strength is good in the iliopsoas, the quads,  the tibialis anterior, and the gastroc.  Deep tendon reflexes are 2+ in the  patella and 1+ in the Achilles.  Babinski's are downgoing.  Sensation is  intact in the distal lower extremities.  It is intact in the distal upper  extremities to vibration.  There is tenderness to palpation in the  supraclavicular fossa and a considerable amount of musculoskeletal spasm  noted, particularly in the trapezius.  NECK:  No bruits.  No masses are palpable.  Range of motion is somewhat  limited in the neck turning only 60 degrees to the right and to the left,  flexing and extending only approximately 80% of normal.  BACK:  Palpation and percussion of his back reproduces some modest  tenderness, particularly in the region on the right side of his hip and  buttock.  LUNGS:  Clear to auscultation.  HEART:  Regular rate and rhythm with no murmurs  heard.  ABDOMEN:  Soft.  Bowel sounds are positive.  No masses are palpable.  EXTREMITIES:  No clubbing, cyanosis or edema.   IMPRESSION:  Patient has evidence of spondylitic disease with  pseudoarthrosis from C5-7 cervically and also pseudoarthrosis at L3-4.  We  will be admitting him to undergo revision arthrodesis in the lumbar spine  and supplemental fixation posteriorly in the cervical spine.  We will be  using a BMP product known as Infuse to help supplement his arthrodesis and  hopefully try to lead to a solid fusion at these levels.                                                Stefani Dama, M.D.    Merla Riches  D:  11/14/2003  T:  11/14/2003  Job:  [id number]

## 2011-03-07 ENCOUNTER — Telehealth: Payer: Self-pay | Admitting: Gastroenterology

## 2011-03-07 NOTE — Telephone Encounter (Signed)
Patient is having continued abdominal pain.  Do you need to see him here or refer him back to [facility]?? He said you all discussed this in the past.  His pain is there all the time.  Constant abdominal burning.

## 2011-03-07 NOTE — Telephone Encounter (Signed)
Yes he should return to [facility] dept for reevaluation of his GI symptoms

## 2011-03-08 NOTE — Telephone Encounter (Signed)
I spoke with the patient and he is advised of Dr Ardell Isaacs recommendations and appt with Dr Lanell Matar for 04/19/11 12:15

## 2011-03-08 NOTE — Telephone Encounter (Signed)
Addended by: Annett Fabian on: 03/08/2011 09:07 AM   Modules accepted: Orders

## 2011-04-20 ENCOUNTER — Telehealth: Payer: Self-pay | Admitting: Gastroenterology

## 2011-04-20 NOTE — Telephone Encounter (Signed)
Pt reports he saw Dr Lanell Matar at [place] today. Dr Lanell Matar thinks he could have a Sphincter of ODI dysfunction or a biliary stone. Pt stated Dr Lanell Matar needs more info from [place]; he has no info on the ER visit in [place] last year. Pt requests I send everything from 11/20/09 especially labs to : ATTN Carlisle Beers at [phone]. Done. Dr Russella Dar , [place] only.

## 2011-07-21 DIAGNOSIS — K220 Achalasia of cardia: Secondary | ICD-10-CM | POA: Insufficient documentation

## 2012-03-30 ENCOUNTER — Other Ambulatory Visit: Payer: Self-pay | Admitting: Gastroenterology

## 2012-12-04 ENCOUNTER — Inpatient Hospital Stay (HOSPITAL_COMMUNITY)
Admission: EM | Admit: 2012-12-04 | Discharge: 2012-12-07 | DRG: 247 | Disposition: A | Discharge status: Home / Self Care | Payer: 59 | Attending: Family Medicine | Admitting: Family Medicine

## 2012-12-04 ENCOUNTER — Encounter (HOSPITAL_COMMUNITY): Payer: Self-pay | Admitting: *Deleted

## 2012-12-04 ENCOUNTER — Emergency Department (HOSPITAL_COMMUNITY): Payer: 59

## 2012-12-04 DIAGNOSIS — D696 Thrombocytopenia, unspecified: Secondary | ICD-10-CM

## 2012-12-04 DIAGNOSIS — I200 Unstable angina: Secondary | ICD-10-CM | POA: Diagnosis present

## 2012-12-04 DIAGNOSIS — Z7902 Long term (current) use of antithrombotics/antiplatelets: Secondary | ICD-10-CM

## 2012-12-04 DIAGNOSIS — F17200 Nicotine dependence, unspecified, uncomplicated: Secondary | ICD-10-CM | POA: Diagnosis present

## 2012-12-04 DIAGNOSIS — Z7982 Long term (current) use of aspirin: Secondary | ICD-10-CM

## 2012-12-04 DIAGNOSIS — Z79899 Other long term (current) drug therapy: Secondary | ICD-10-CM

## 2012-12-04 DIAGNOSIS — K219 Gastro-esophageal reflux disease without esophagitis: Secondary | ICD-10-CM | POA: Diagnosis present

## 2012-12-04 DIAGNOSIS — Z981 Arthrodesis status: Secondary | ICD-10-CM

## 2012-12-04 DIAGNOSIS — Z91040 Latex allergy status: Secondary | ICD-10-CM

## 2012-12-04 DIAGNOSIS — R079 Chest pain, unspecified: Secondary | ICD-10-CM

## 2012-12-04 DIAGNOSIS — I2510 Atherosclerotic heart disease of native coronary artery without angina pectoris: Principal | ICD-10-CM | POA: Diagnosis present

## 2012-12-04 DIAGNOSIS — I129 Hypertensive chronic kidney disease with stage 1 through stage 4 chronic kidney disease, or unspecified chronic kidney disease: Secondary | ICD-10-CM | POA: Diagnosis present

## 2012-12-04 DIAGNOSIS — Z955 Presence of coronary angioplasty implant and graft: Secondary | ICD-10-CM

## 2012-12-04 DIAGNOSIS — E785 Hyperlipidemia, unspecified: Secondary | ICD-10-CM | POA: Diagnosis present

## 2012-12-04 DIAGNOSIS — N182 Chronic kidney disease, stage 2 (mild): Secondary | ICD-10-CM | POA: Diagnosis present

## 2012-12-04 HISTORY — DX: Disorder of kidney and ureter, unspecified: N28.9

## 2012-12-04 HISTORY — DX: Unspecified abdominal pain: R10.9

## 2012-12-04 LAB — COMPREHENSIVE METABOLIC PANEL
ALT: 18 U/L (ref 0–53)
AST: 18 U/L (ref 0–37)
Albumin: 3.8 g/dL (ref 3.5–5.2)
Alkaline Phosphatase: 79 U/L (ref 39–117)
CO2: 27 mEq/L (ref 19–32)
Chloride: 106 mEq/L (ref 96–112)
Creatinine, Ser: 1.32 mg/dL (ref 0.50–1.35)
GFR calc non Af Amer: 58 mL/min — ABNORMAL LOW (ref >90)
Potassium: 4.2 mEq/L (ref 3.5–5.1)
Sodium: 142 mEq/L (ref 135–145)
Total Bilirubin: 0.3 mg/dL (ref 0.3–1.2)

## 2012-12-04 LAB — CBC
Platelets: 128 10*3/uL — ABNORMAL LOW (ref 150–400)
RBC: 5.08 MIL/uL (ref 4.22–5.81)
RDW: 13.9 % (ref 11.5–15.5)
WBC: 7 10*3/uL (ref 4.0–10.5)

## 2012-12-04 LAB — POCT I-STAT, CHEM 8
Calcium, Ion: 1.19 mmol/L (ref 1.12–1.23)
HCT: 46 % (ref 39.0–52.0)
Sodium: 141 mEq/L (ref 135–145)
TCO2: 30 mmol/L (ref 0–100)

## 2012-12-04 LAB — TROPONIN I: Troponin I: <0.3 ng/mL (ref <0.30)

## 2012-12-04 LAB — PROTIME-INR: Prothrombin Time: 12.4 seconds (ref 11.6–15.2)

## 2012-12-04 MED ORDER — HYDROMORPHONE HCL PF 2 MG/ML IJ SOLN: 2.0000 mg | Freq: Once | INTRAMUSCULAR | Status: DC

## 2012-12-04 MED ORDER — ONDANSETRON HCL 4 MG/2ML IJ SOLN: 4.0000 mg | Freq: Once | INTRAMUSCULAR | Status: DC

## 2012-12-04 MED ORDER — ASPIRIN 81 MG PO CHEW
162.0000 mg | CHEWABLE_TABLET | Freq: Once | ORAL | Status: AC
  Administered 2012-12-04: 162 mg via ORAL
  Filled 2012-12-04: qty 2

## 2012-12-04 MED ORDER — HEPARIN BOLUS VIA INFUSION
4000.0000 [IU] | Freq: Once | INTRAVENOUS | Status: AC
  Administered 2012-12-05: 4000 [IU] via INTRAVENOUS

## 2012-12-04 MED ORDER — HEPARIN (PORCINE) IN NACL 100-0.45 UNIT/ML-% IJ SOLN
1300.0000 [IU]/h | INTRAMUSCULAR | Status: DC
  Administered 2012-12-05: 1300 [IU]/h via INTRAVENOUS
  Filled 2012-12-04 (×2): qty 250

## 2012-12-04 NOTE — ED Notes (Addendum)
Pt c/o L chest pressure since yesterday.  Today he became diaphoretic and the pressure radiated from L to R side.  Pressure feels as if s/o is sitting on his chest.  Pt took 1 81 mg asa before leaving home.

## 2012-12-04 NOTE — ED Provider Notes (Signed)
History     CSN: [id number]  Arrival date & time 12/04/12  1903   First MD Initiated Contact with Patient 12/04/12 2045      Chief Complaint  Patient presents with  . Chest Pain    (Consider location/radiation/quality/duration/timing/severity/associated sxs/prior treatment) Patient is a 57 y.o. male presenting with chest pain. The history is provided by the patient.  Chest Pain  patient here complaining of chest pain since yesterday which is been intermittent lasting anywhere from 15 minutes to 1 hour with associated diaphoresis and shortness of breath. No exertional component to this. No recent fever or chills. No leg pain or swelling. No syncope or near-syncope. No prior history of same. He is currently pain-free at this time. Take aspirin twice prior to arrival.  Past Medical History  Diagnosis Date  . Renal disorder     renal calculi  . Stomach pain     chronic    Past Surgical History  Procedure Laterality Date  . Cholecystectomy    . Back surgery      2 cervical/ 2 lumbar fusions    No family history on file.  History  Substance Use Topics  . Smoking status: Current Every Day Smoker -- 0.50 packs/day    Types: Cigarettes  . Smokeless tobacco: Not on file  . Alcohol Use: No      Review of Systems  Cardiovascular: Positive for chest pain.  All other systems reviewed and are negative.    Allergies  Latex  Home Medications   Current Outpatient Rx  Name  Route  Sig  Dispense  Refill  . acidophilus (RISAQUAD) CAPS   Oral   Take 1 capsule by mouth daily.         Marland Kitchen aspirin EC 81 MG tablet   Oral   Take 81 mg by mouth daily.         . cyclobenzaprine (FLEXERIL) 10 MG tablet   Oral   Take 10 mg by mouth 3 (three) times daily as needed for muscle spasms.         Marland Kitchen dicyclomine (BENTYL) 20 MG tablet   Oral   Take 20 mg by mouth every 6 (six) hours.         . docusate sodium (COLACE) 100 MG capsule   Oral   Take 100 mg by mouth at bedtime.          . hydrocortisone (ANUSOL-HC) 2.5 % rectal cream   Rectal   Place 1 application rectally as needed for hemorrhoids.         . mometasone (NASONEX) 50 MCG/ACT nasal spray   Nasal   Place 2 sprays into the nose at bedtime as needed (allergies).         . Multiple Vitamin (MULTIVITAMIN WITH MINERALS) TABS   Oral   Take 1 tablet by mouth daily.         Marland Kitchen olmesartan (BENICAR) 40 MG tablet   Oral   Take 40 mg by mouth every morning.         Marland Kitchen oxyCODONE-acetaminophen (PERCOCET/ROXICET) 5-325 MG per tablet   Oral   Take 1 tablet by mouth every 4 (four) hours as needed for pain.         . pantoprazole (PROTONIX) 40 MG tablet   Oral   Take 40 mg by mouth 2 (two) times daily.          Marland Kitchen senna (SENOKOT) 8.6 MG TABS   Oral   Take 1 tablet  by mouth at bedtime.         . simvastatin (ZOCOR) 40 MG tablet   Oral   Take 40 mg by mouth every evening.           BP 138/85  Pulse 50  Temp(Src) 97.9 F (36.6 C) (Oral)  Resp 17  Ht 5\' 11"  (1.803 m)  Wt 212 lb (96.163 kg)  BMI 29.58 kg/m2  SpO2 100%  Physical Exam  Nursing note and vitals reviewed. Constitutional: He is oriented to person, place, and time. He appears well-developed and well-nourished.  Non-toxic appearance. No distress.  HENT:  Head: Normocephalic and atraumatic.  Eyes: Conjunctivae, EOM and lids are normal. Pupils are equal, round, and reactive to light.  Neck: Normal range of motion. Neck supple. No tracheal deviation present. No mass present.  Cardiovascular: Normal rate, regular rhythm and normal heart sounds.  Exam reveals no gallop.   No murmur heard. Pulmonary/Chest: Effort normal and breath sounds normal. No stridor. No respiratory distress. He has no decreased breath sounds. He has no wheezes. He has no rhonchi. He has no rales.  Abdominal: Soft. Normal appearance and bowel sounds are normal. He exhibits no distension. There is no tenderness. There is no rebound and no CVA tenderness.   Musculoskeletal: Normal range of motion. He exhibits no edema and no tenderness.  Neurological: He is alert and oriented to person, place, and time. He has normal strength. No cranial nerve deficit or sensory deficit. GCS eye subscore is 4. GCS verbal subscore is 5. GCS motor subscore is 6.  Skin: Skin is warm and dry. No abrasion and no rash noted.  Psychiatric: He has a normal mood and affect. His speech is normal and behavior is normal.    ED Course  Procedures (including critical care time)  Labs Reviewed  CBC - Abnormal; Notable for the following:    Platelets 128 (*)    All other components within normal limits  COMPREHENSIVE METABOLIC PANEL - Abnormal; Notable for the following:    Glucose, Bld 117 (*)    GFR calc non Af Amer 58 (*)    GFR calc Af Amer 68 (*)    All other components within normal limits  TROPONIN I  POCT I-STAT, CHEM 8   Dg Chest 2 View  12/04/2012  *RADIOLOGY REPORT*  Clinical Data: 57 year old male chest pain shortness of breath.  CHEST - 2 VIEW  Comparison: 09/22/2006.  Findings: Lower cervical spine fusion hardware may be new and is partially visible.  Stable lung volumes.  Mild eventration of the diaphragm.  Cardiac size and mediastinal contours are within normal limits.  Visualized tracheal air column is within normal limits. No pneumothorax, pulmonary edema, pleural effusion or acute pulmonary opacity.  Stable small left midlung calcified granuloma. Right upper quadrant surgical clips re-identified. No acute osseous abnormality identified.  IMPRESSION: No acute cardiopulmonary abnormality.   Original Report Authenticated By: Erskine Speed, M.D.      No diagnosis found.    MDM     Rate: 72   Rhythm: normal sinus rhythm  QRS Axis: normal  Intervals: normal  ST/T Wave abnormalities: normal  Conduction Disutrbances:none  Narrative Interpretation:   Old EKG Reviewed: none available  Patient had aspirin prior to arrival. History chest pain-free. EKG is  normal. He'll be heparinized and admitted to the hospitalist service       Toy Baker, MD 12/04/12 2326

## 2012-12-04 NOTE — ED Notes (Addendum)
Dr. Freida Busman and Pharmacy aware of pt's platelet count of 128. States to give Heparin as ordered.

## 2012-12-04 NOTE — Progress Notes (Signed)
ANTICOAGULATION CONSULT NOTE - Initial Consult  Pharmacy Consult for heparin Indication: Unstable angina  Allergies  Allergen Reactions  . Latex Hives and Rash    Patient Measurements: Height: 5\' 11"  (180.3 cm) Weight: 212 lb (96.163 kg) IBW/kg (Calculated) : 75.3 Heparin Dosing Weight: 94 kg   Vital Signs: Temp: 97.9 F (36.6 C) (03/04 1910) Temp src: Oral (03/04 1910) BP: 138/85 mmHg (03/04 2200) Pulse Rate: 50 (03/04 2200)  Labs:  Recent Labs  12/04/12 1917 12/04/12 2139 12/04/12 2144  HGB 15.7  --  15.6  HCT 44.6  --  46.0  PLT 128*  --   --   CREATININE 1.32  --  1.30  TROPONINI  --  <0.30  --     Estimated Creatinine Clearance: 74.2 ml/min (by C-G formula based on Cr of 1.3).   Medical History: Past Medical History  Diagnosis Date  . Renal disorder     renal calculi  . Stomach pain     chronic    Medications:  Scheduled:  . aspirin  162 mg Oral Once  . [DISCONTINUED]  HYDROmorphone (DILAUDID) injection  2 mg Intravenous Once  . [DISCONTINUED] ondansetron (ZOFRAN) IV  4 mg Intravenous Once    Assessment: 57 yo male presented with chest pain. Pharmacy to manage IV heparin for unstable angina.   Goal of Therapy:  Heparin level 0.3-0.7 units/ml Monitor platelets by anticoagulation protocol: Yes   Plan:  1. Heparin IV bolus of 4000 units x 1, then IV infusion of 1300 units/hr.  2. Heparin level in 6 hours 3. Daily CBC, heparin level  Emeline Gins 12/04/2012,11:29 PM

## 2012-12-04 NOTE — ED Notes (Signed)
I stat troponin completed x 3 with no results given.  Dr. Freida Busman advised have phlebotomy re-draw for I stat troponin and regular troponin in main lab.  Communicated with Phlebotomy and they will re-draw.  Morrell Riddle, EMT

## 2012-12-05 ENCOUNTER — Encounter (HOSPITAL_COMMUNITY)
Admission: EM | Disposition: A | Discharge status: Home / Self Care | Payer: Self-pay | Source: Home / Self Care | Attending: Family Medicine

## 2012-12-05 DIAGNOSIS — D696 Thrombocytopenia, unspecified: Secondary | ICD-10-CM

## 2012-12-05 DIAGNOSIS — R079 Chest pain, unspecified: Secondary | ICD-10-CM

## 2012-12-05 DIAGNOSIS — R072 Precordial pain: Secondary | ICD-10-CM

## 2012-12-05 HISTORY — PX: LEFT HEART CATHETERIZATION WITH CORONARY ANGIOGRAM: SHX5451

## 2012-12-05 LAB — BASIC METABOLIC PANEL
GFR calc Af Amer: 82 mL/min — ABNORMAL LOW (ref >90)
GFR calc non Af Amer: 71 mL/min — ABNORMAL LOW (ref >90)
Potassium: 4.3 mEq/L (ref 3.5–5.1)
Sodium: 139 mEq/L (ref 135–145)

## 2012-12-05 LAB — CBC
HCT: 41.1 % (ref 39.0–52.0)
MCH: 30.1 pg (ref 26.0–34.0)
MCHC: 35.3 g/dL (ref 30.0–36.0)
MCV: 85.3 fL (ref 78.0–100.0)
Platelets: 122 10*3/uL — ABNORMAL LOW (ref 150–400)
RDW: 14 % (ref 11.5–15.5)

## 2012-12-05 LAB — POCT ACTIVATED CLOTTING TIME: Activated Clotting Time: 160 seconds

## 2012-12-05 LAB — TSH: TSH: 1.492 u[IU]/mL (ref 0.350–4.500)

## 2012-12-05 SURGERY — LEFT HEART CATHETERIZATION WITH CORONARY ANGIOGRAM
Anesthesia: LOCAL

## 2012-12-05 SURGERY — LEFT HEART CATHETERIZATION WITH CORONARY ANGIOGRAM
Anesthesia: Moderate Sedation

## 2012-12-05 MED ORDER — FENTANYL CITRATE 0.05 MG/ML IJ SOLN
INTRAMUSCULAR | Status: AC
  Filled 2012-12-05: qty 2

## 2012-12-05 MED ORDER — SODIUM CHLORIDE 0.9 % IV SOLN: 250.0000 mL | INTRAVENOUS | Status: DC | PRN

## 2012-12-05 MED ORDER — NITROGLYCERIN 2 % TD OINT
1.0000 [in_us] | TOPICAL_OINTMENT | Freq: Three times a day (TID) | TRANSDERMAL | Status: DC
  Administered 2012-12-05 – 2012-12-06 (×4): 1 [in_us] via TOPICAL
  Filled 2012-12-05 (×22): qty 30

## 2012-12-05 MED ORDER — PANTOPRAZOLE SODIUM 40 MG PO TBEC
40.0000 mg | DELAYED_RELEASE_TABLET | Freq: Two times a day (BID) | ORAL | Status: DC
  Administered 2012-12-05 – 2012-12-07 (×5): 40 mg via ORAL
  Filled 2012-12-05 (×5): qty 1

## 2012-12-05 MED ORDER — DIAZEPAM 5 MG PO TABS: 5.0000 mg | ORAL_TABLET | ORAL | Status: DC

## 2012-12-05 MED ORDER — CYCLOBENZAPRINE HCL 10 MG PO TABS
10.0000 mg | ORAL_TABLET | Freq: Three times a day (TID) | ORAL | Status: DC | PRN
  Filled 2012-12-05: qty 1

## 2012-12-05 MED ORDER — MORPHINE SULFATE 2 MG/ML IJ SOLN
2.0000 mg | INTRAMUSCULAR | Status: DC | PRN
  Administered 2012-12-05 – 2012-12-06 (×6): 2 mg via INTRAVENOUS
  Filled 2012-12-05 (×6): qty 1

## 2012-12-05 MED ORDER — MIDAZOLAM HCL 2 MG/2ML IJ SOLN
INTRAMUSCULAR | Status: AC
  Filled 2012-12-05: qty 2

## 2012-12-05 MED ORDER — CLOPIDOGREL BISULFATE 75 MG PO TABS
75.0000 mg | ORAL_TABLET | Freq: Every day | ORAL | Status: DC
  Administered 2012-12-06: 75 mg via ORAL
  Filled 2012-12-05 (×2): qty 1

## 2012-12-05 MED ORDER — SODIUM CHLORIDE 0.9 % IJ SOLN: 3.0000 mL | Freq: Two times a day (BID) | INTRAMUSCULAR | Status: DC

## 2012-12-05 MED ORDER — IRBESARTAN 75 MG PO TABS
75.0000 mg | ORAL_TABLET | Freq: Every day | ORAL | Status: DC
  Administered 2012-12-06 – 2012-12-07 (×2): 75 mg via ORAL
  Filled 2012-12-05 (×2): qty 1

## 2012-12-05 MED ORDER — DOCUSATE SODIUM 100 MG PO CAPS: 100.0000 mg | ORAL_CAPSULE | Freq: Every day | ORAL | Status: DC

## 2012-12-05 MED ORDER — SODIUM CHLORIDE 0.9 % IJ SOLN: 3.0000 mL | INTRAMUSCULAR | Status: DC | PRN

## 2012-12-05 MED ORDER — DIAZEPAM 2 MG PO TABS
2.0000 mg | ORAL_TABLET | Freq: Four times a day (QID) | ORAL | Status: DC | PRN
  Administered 2012-12-06: 2 mg via ORAL
  Filled 2012-12-05: qty 1

## 2012-12-05 MED ORDER — OXYCODONE-ACETAMINOPHEN 5-325 MG PO TABS
1.0000 | ORAL_TABLET | ORAL | Status: DC | PRN
  Administered 2012-12-05: 1 via ORAL
  Filled 2012-12-05 (×2): qty 1

## 2012-12-05 MED ORDER — ACETAMINOPHEN 325 MG PO TABS: 650.0000 mg | ORAL_TABLET | ORAL | Status: DC | PRN

## 2012-12-05 MED ORDER — DIAZEPAM 5 MG PO TABS
ORAL_TABLET | ORAL | Status: AC
  Administered 2012-12-05: 5 mg
  Filled 2012-12-05: qty 1

## 2012-12-05 MED ORDER — ASPIRIN EC 325 MG PO TBEC
325.0000 mg | DELAYED_RELEASE_TABLET | Freq: Every day | ORAL | Status: DC
  Filled 2012-12-05: qty 1

## 2012-12-05 MED ORDER — SODIUM CHLORIDE 0.9 % IV SOLN
INTRAVENOUS | Status: DC
  Administered 2012-12-05: 13:00:00 via INTRAVENOUS

## 2012-12-05 MED ORDER — SODIUM CHLORIDE 0.9 % IJ SOLN
3.0000 mL | Freq: Two times a day (BID) | INTRAMUSCULAR | Status: DC
  Administered 2012-12-05 – 2012-12-07 (×4): 3 mL via INTRAVENOUS

## 2012-12-05 MED ORDER — DICYCLOMINE HCL 20 MG PO TABS
20.0000 mg | ORAL_TABLET | Freq: Four times a day (QID) | ORAL | Status: DC
  Administered 2012-12-05 – 2012-12-07 (×7): 20 mg via ORAL
  Filled 2012-12-05 (×16): qty 1

## 2012-12-05 MED ORDER — ONDANSETRON HCL 4 MG/2ML IJ SOLN: 4.0000 mg | Freq: Four times a day (QID) | INTRAMUSCULAR | Status: DC | PRN

## 2012-12-05 MED ORDER — ADULT MULTIVITAMIN W/MINERALS CH
1.0000 | ORAL_TABLET | Freq: Every day | ORAL | Status: DC
  Administered 2012-12-05 – 2012-12-07 (×3): 1 via ORAL
  Filled 2012-12-05 (×3): qty 1

## 2012-12-05 MED ORDER — SIMVASTATIN 40 MG PO TABS
40.0000 mg | ORAL_TABLET | Freq: Every evening | ORAL | Status: DC
  Administered 2012-12-05: 40 mg via ORAL
  Filled 2012-12-05 (×2): qty 1

## 2012-12-05 MED ORDER — DOCUSATE SODIUM 100 MG PO CAPS
100.0000 mg | ORAL_CAPSULE | Freq: Two times a day (BID) | ORAL | Status: DC
  Administered 2012-12-05 – 2012-12-07 (×4): 100 mg via ORAL
  Filled 2012-12-05 (×8): qty 1

## 2012-12-05 MED ORDER — ASPIRIN 81 MG PO CHEW
CHEWABLE_TABLET | ORAL | Status: AC
  Administered 2012-12-05: 324 mg
  Filled 2012-12-05: qty 5

## 2012-12-05 MED ORDER — IRBESARTAN 300 MG PO TABS
300.0000 mg | ORAL_TABLET | Freq: Every day | ORAL | Status: DC
  Administered 2012-12-05: 300 mg via ORAL
  Filled 2012-12-05: qty 1

## 2012-12-05 MED ORDER — ONDANSETRON HCL 4 MG PO TABS: 4.0000 mg | ORAL_TABLET | Freq: Four times a day (QID) | ORAL | Status: DC | PRN

## 2012-12-05 MED ORDER — FLUTICASONE PROPIONATE 50 MCG/ACT NA SUSP
1.0000 | Freq: Every day | NASAL | Status: DC
  Administered 2012-12-05 – 2012-12-07 (×4): 1 via NASAL
  Filled 2012-12-05: qty 16

## 2012-12-05 MED ORDER — HYDROCORTISONE 2.5 % RE CREA: 1.0000 "application " | TOPICAL_CREAM | RECTAL | Status: DC | PRN

## 2012-12-05 MED ORDER — LIDOCAINE HCL (PF) 1 % IJ SOLN
INTRAMUSCULAR | Status: AC
  Filled 2012-12-05: qty 30

## 2012-12-05 MED ORDER — FENTANYL CITRATE 0.05 MG/ML IJ SOLN
25.0000 ug | Freq: Once | INTRAMUSCULAR | Status: AC
  Administered 2012-12-05: 25 ug via INTRAVENOUS

## 2012-12-05 MED ORDER — HEPARIN (PORCINE) IN NACL 100-0.45 UNIT/ML-% IJ SOLN
1400.0000 [IU]/h | INTRAMUSCULAR | Status: DC
  Administered 2012-12-05: 1300 [IU]/h via INTRAVENOUS
  Filled 2012-12-05 (×3): qty 250

## 2012-12-05 MED ORDER — DIAZEPAM 5 MG PO TABS
ORAL_TABLET | ORAL | Status: AC
  Filled 2012-12-05: qty 1

## 2012-12-05 MED ORDER — MAGNESIUM HYDROXIDE 400 MG/5ML PO SUSP
30.0000 mL | Freq: Every day | ORAL | Status: DC | PRN
  Administered 2012-12-06: 30 mL via ORAL
  Filled 2012-12-05 (×2): qty 30

## 2012-12-05 MED ORDER — SODIUM CHLORIDE 0.9 % IV SOLN: INTRAVENOUS | Status: DC

## 2012-12-05 MED ORDER — SODIUM CHLORIDE 0.9 % IV SOLN
INTRAVENOUS | Status: DC
  Administered 2012-12-05: 10:00:00 via INTRAVENOUS

## 2012-12-05 MED ORDER — BISACODYL 10 MG RE SUPP: 10.0000 mg | Freq: Once | RECTAL | Status: DC

## 2012-12-05 MED ORDER — RISAQUAD PO CAPS
1.0000 | ORAL_CAPSULE | Freq: Every day | ORAL | Status: DC
  Administered 2012-12-05 – 2012-12-07 (×3): 1 via ORAL
  Filled 2012-12-05 (×3): qty 1

## 2012-12-05 MED ORDER — HEPARIN (PORCINE) IN NACL 2-0.9 UNIT/ML-% IJ SOLN
INTRAMUSCULAR | Status: AC
  Filled 2012-12-05: qty 1000

## 2012-12-05 MED ORDER — METHYLPREDNISOLONE SODIUM SUCC 125 MG IJ SOLR
INTRAMUSCULAR | Status: AC
  Filled 2012-12-05: qty 2

## 2012-12-05 NOTE — Progress Notes (Signed)
ANTICOAGULATION CONSULT NOTE - Follow Up Consult  Pharmacy Consult for Heparin Indication: Resumed post-cath  Allergies  Allergen Reactions  . Latex Hives and Rash    Patient Measurements: Height: 5\' 11"  (180.3 cm) Weight: 212 lb (96.163 kg) IBW/kg (Calculated) : 75.3 Heparin Dosing Weight:   Vital Signs: Temp: 97.8 F (36.6 C) (03/05 0500) Temp src: Oral (03/05 0100) BP: 115/76 mmHg (03/05 0500) Pulse Rate: 59 (03/05 1007)  Labs:  Recent Labs  12/04/12 1917 12/04/12 2139 12/04/12 2144 12/04/12 2326 12/05/12 0147 12/05/12 0710  HGB 15.7  --  15.6  --   --  14.5  HCT 44.6  --  46.0  --   --  41.1  PLT 128*  --   --   --   --  122*  LABPROT  --   --   --  12.4  --   --   INR  --   --   --  0.93  --   --   HEPARINUNFRC  --   --   --   --   --  0.40  CREATININE 1.32  --  1.30  --   --   --   TROPONINI  --  <0.30  --   --  <0.30 <0.30    Estimated Creatinine Clearance: 74.2 ml/min (by C-G formula based on Cr of 1.3).   Assessment: USAP. EKG and cardiac enzymes unremarkable.  Anticoag: Resume post-cath.  Cards: h/o HTN, HLD. Cath 3/5: Ejection fraction 50-60 %. Recommend OM +/- RCA angioplasty in near future.  GI/Nutiriton: GERD with chronic epigastric pain  Neuro: chronic upper neck and shoulder pain, s/p cervical fusion  Pulm: Tobacco use.   Goal of Therapy:  Heparin level 0.3-0.7 units/ml Monitor platelets by anticoagulation protocol: Yes   Plan:  Sheath pulled in cath holding area. At 8pm, resume IV heparin at 1300 units/hr (8 hrs post-sheath removal). Daily heparin level and CBC.  Crystal S. Merilynn Finland, PharmD, BCPS Clinical Staff Pharmacist Pager [phone]   Misty Stanley Stillinger 12/05/2012,12:03 PM

## 2012-12-05 NOTE — Progress Notes (Signed)
ANTICOAGULATION CONSULT NOTE - Follow Up Consult  Pharmacy Consult for heparin Indication: USAP  Labs:  Recent Labs  12/04/12 1917 12/04/12 2139 12/04/12 2144 12/04/12 2326 12/05/12 0147 12/05/12 0710  HGB 15.7  --  15.6  --   --  14.5  HCT 44.6  --  46.0  --   --  41.1  PLT 128*  --   --   --   --  122*  LABPROT  --   --   --  12.4  --   --   INR  --   --   --  0.93  --   --   HEPARINUNFRC  --   --   --   --   --  0.40  CREATININE 1.32  --  1.30  --   --   --   TROPONINI  --  <0.30  --   --  <0.30  --     Assessment/Plan:  57yo male therapeutic on heparin with initial dosing for USAP.  Will continue gtt at current rate and confirm stable with additional level.  Vernard Gambles, PharmD, BCPS  12/05/2012,7:53 AM

## 2012-12-05 NOTE — CV Procedure (Signed)
PROCEDURE:  Left heart catheterization with selective coronary angiography, left ventriculogram.  CLINICAL HISTORY:  This is a 57 years old male with typical angina and cardiac risk factors of smoking, hypertension and hyperlipidemia.  The risks, benefits, and details of the procedure were explained to the patient.  The patient verbalized understanding and wanted to proceed.  Informed written consent was obtained.  PROCEDURE TECHNIQUE:  The patient was approached from the right femoral artery using a 5 French short sheath.  Left coronary angiography was done using a Judkins L4 guide catheter.  Right coronary angiography was done using a Judkins R4 guide catheter.  Left ventriculography was done using a pigtail catheter.    CONTRAST:  Total of 46 cc.  COMPLICATIONS:  None.  At the end of the procedure a manual pressure device was used for hemostasis.    HEMODYNAMICS:  Aortic pressure was 103/68; LV pressure was 122/6; LVEDP 15.  There was no significant gradient between the left ventricle and aorta.    ANGIOGRAM/CORONARY ARTERIOGRAM:   The left main coronary artery has mild distal narrowing.  The left anterior descending artery has 20 % osteal narrowing, proximal 50 % narrowing after diagonal one origin, then mild diffuse narrowing. Large diagonal one with mild diffuse disease.  The left circumflex artery dominant with minimal osteal narrowing, mid vessel mild disease and distal mild disease. OM 1 with near ostium 30 % stenosis and mid vessel mild disease. Larger OM 2 had mild proximal disease then mild narrowing followed by bifurcation where superior branch had ostium to proximal 90 % stenosis and somewhat smaller inferior branch has proximal 70 and 80 % blockages. PDA has mild diffuse disease.  The right coronary artery is nondominant with diffuse moderate to severe disease throughout proximal 2/3 of the vessel including osteal conus and osteal marginal branch severe disease.  LEFT  VENTRICULOGRAM:  Left ventricular angiogram was done in the 30 RAO projection and revealed mild inferior wall hypokinesia and mild systolic dysfunction with an estimated ejection fraction of 50-60 %.  LVEDP was 15 mmHg.  IMPRESSION OF HEART CATHETERIZATION:   1. Mild left main coronary artery disease. 2. Moderate left anterior descending artery and its branches. 3. Mild left circumflex artery and moderate to severe disease of OM 2  branches. 4. Non-dominant right coronary artery with severe disease. 5. Mild left ventricular systolic dysfunction.  LVEDP 15 mmHg.  Ejection fraction 50-60 %.  RECOMMENDATION:   OM +/- RCA angioplasty in near future. Dr. [facility] notified per family request.

## 2012-12-05 NOTE — Progress Notes (Signed)
12:56 PM I agree with HPI/GPe and A/P per Dr. Conley Rolls   Patient admitted early 12/05/12 am with CP which was central chest pressure-like in origin, with some rad.  HE states he has had numerous GI issues in the recent past with extensive work-up by Dr. Russella Dar, but this remains an occult cause He currently denies any CP whatsoever and has some burning in his lower chest, but no other issues. He seems a little tired at present   HEENT-tired appearing CM in NAD CHEST-clinically clear, no added sound CARDIAC- s1 s2 no m/r/g, no Bruit, no JVD ABDOMEN-Soft, slighlty tender in Epig, no rebound/gaurd NEURO-intact  Chart review Admission 6.9.2005 for L4-5 Fusion Admission 2.10.05 for Spondylosis and Pseudoarthrosis c5-6, c6-7 +Cervical radiculopathy Admission 2.25.05 for Intractable LBP s/p Lumbar revision arthrodesis L3-4 Admission 3.10.02 for L3-4 Disk herniation    Patient Active Problem List  Diagnosis  . HYPERLIPIDEMIA  . THROMBOCYTOPENIA, CHRONIC  . HYPERTENSION  . GERD  . CHOLEDOCHOLITHIASIS  . NEPHROLITHIASIS  . NAUSEA  . ABDOMINAL PAIN RIGHT UPPER QUADRANT  . ABDOMINAL PAIN-EPIGASTRIC  . Nonspecific (abnormal) findings on radiological and other examination of biliary tract  . PERSONAL HX COLONIC POLYPS  . HEMORRHOIDS  . CHRONIC RHINOSINUSITIS  . CONSTIPATION  . DYSPHAGIA  . FLATULENCE-GAS-BLOATING  . Chest pain at rest    Chest pain-cardiac catheterization performed 12/05/2012 showing nondominant right coronary artery with severe disease, and mild coronary disease elsewhere.-[facility] has been notified as per patient family request and will assume Cardiac care in am-continue Plavix 75 daily, aspirin 81 daily.  Continue heparin IV until seen by cardiology-follow echocardiogram  Abdominal pain-unclear etiology. Will need to be seen in the future by Dr. Cheron Schaumann stable  Multiple back surgeries-currently stable  History hypertension-currently stable. 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 All a.m. basic metabolic panel   Pleas Koch, MD [facility] [phone]

## 2012-12-05 NOTE — Consult Note (Signed)
Russell Padilla is an 57 y.o. male.   Chief Complaint: Chest pain HPI: 57 y.o. 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 He said this pain is different than his usual pain. Similar pain x last 2 days. In ED chest pain is relieved with morphine. EKG and cardiac enzymes are unremarkable.   Past Medical History  Diagnosis Date  . Renal disorder     renal calculi  . Stomach pain     chronic      Past Surgical History  Procedure Laterality Date  . Cholecystectomy    . Back surgery      2 cervical/ 2 lumbar fusions    No family history on file. Social History:  reports that he has been smoking Cigarettes.  He has been smoking about 0.50 packs per day. He does not have any smokeless tobacco history on file. He reports that he does not drink alcohol or use illicit drugs.  Allergies:  Allergies  Allergen Reactions  . Latex Hives and Rash    Medications Prior to Admission  Medication Sig Dispense Refill  . acidophilus (RISAQUAD) CAPS Take 1 capsule by mouth daily.      Marland Kitchen aspirin EC 81 MG tablet Take 81 mg by mouth daily.      . cyclobenzaprine (FLEXERIL) 10 MG tablet Take 10 mg by mouth 3 (three) times daily as needed for muscle spasms.      Marland Kitchen dicyclomine (BENTYL) 20 MG tablet Take 20 mg by mouth every 6 (six) hours.      . docusate sodium (COLACE) 100 MG capsule Take 100 mg by mouth at bedtime.      . hydrocortisone (ANUSOL-HC) 2.5 % rectal cream Place 1 application rectally as needed for hemorrhoids.      . mometasone (NASONEX) 50 MCG/ACT nasal spray Place 2 sprays into the nose at bedtime as needed (allergies).      . Multiple Vitamin (MULTIVITAMIN WITH MINERALS) TABS Take 1 tablet by mouth daily.      Marland Kitchen olmesartan (BENICAR) 40 MG tablet Take 40 mg by mouth every morning.       Marland Kitchen oxyCODONE-acetaminophen (PERCOCET/ROXICET) 5-325 MG per tablet Take 1 tablet by mouth every 4 (four) hours as needed for pain.      . pantoprazole (PROTONIX) 40 MG tablet Take 40 mg by mouth 2 (two) times daily.       Marland Kitchen senna (SENOKOT) 8.6 MG TABS Take 1 tablet by mouth at bedtime.      . simvastatin (ZOCOR) 40 MG tablet Take 40 mg by mouth every evening.        Results for orders placed during the hospital encounter of 12/04/12 (from the past 48 hour(s))  CBC     Status: Abnormal   Collection Time    12/04/12  7:17 PM      Result Value Range   WBC 7.0  4.0 - 10.5 K/uL   RBC 5.08  4.22 - 5.81 MIL/uL   Hemoglobin 15.7  13.0 - 17.0 g/dL   HCT 16.1  09.6 - 04.5 %   MCV 87.8  78.0 - 100.0 fL   MCH 30.9  26.0 - 34.0 pg   MCHC 35.2  30.0 - 36.0 g/dL   RDW 40.9  81.1 - 91.4 %   Platelets  128 (*) 150 - 400 K/uL  COMPREHENSIVE METABOLIC PANEL     Status: Abnormal   Collection Time    12/04/12  7:17 PM      Result Value Range   Sodium 142  135 - 145 mEq/L   Potassium 4.2  3.5 - 5.1 mEq/L   Chloride 106  96 - 112 mEq/L   CO2 27  19 - 32 mEq/L   Glucose, Bld 117 (*) 70 - 99 mg/dL   BUN 16  6 - 23 mg/dL   Creatinine, Ser 1.61  0.50 - 1.35 mg/dL   Calcium 9.4  8.4 - 09.6 mg/dL   Total Protein 6.9  6.0 - 8.3 g/dL   Albumin 3.8  3.5 - 5.2 g/dL   AST 18  0 - 37 U/L   ALT 18  0 - 53 U/L   Alkaline Phosphatase 79  39 - 117 U/L   Total Bilirubin 0.3  0.3 - 1.2 mg/dL   GFR calc non Af Amer 58 (*) >90 mL/min   GFR calc Af Amer 68 (*) >90 mL/min   Comment:            The eGFR has been calculated     using the CKD EPI equation.     This calculation has not been     validated in all clinical     situations.     eGFR's persistently     <90 mL/min signify     possible Chronic Kidney Disease.  TROPONIN I     Status: None   Collection Time    12/04/12  9:39 PM      Result Value Range   Troponin I <0.30  <0.30 ng/mL   Comment:            Due to the release kinetics of cTnI,     a  negative result within the first hours     of the onset of symptoms does not rule out     myocardial infarction with certainty.     If myocardial infarction is still suspected,     repeat the test at appropriate intervals.  POCT I-STAT, CHEM 8     Status: None   Collection Time    12/04/12  9:44 PM      Result Value Range   Sodium 141  135 - 145 mEq/L   Potassium 4.9  3.5 - 5.1 mEq/L   Chloride 106  96 - 112 mEq/L   BUN 16  6 - 23 mg/dL   Creatinine, Ser 0.45  0.50 - 1.35 mg/dL   Glucose, Bld 88  70 - 99 mg/dL   Calcium, Ion 4.09  8.11 - 1.23 mmol/L   TCO2 30  0 - 100 mmol/L   Hemoglobin 15.6  13.0 - 17.0 g/dL   HCT 91.4  78.2 - 95.6 %  PROTIME-INR     Status: None   Collection Time    12/04/12 11:26 PM      Result Value Range   Prothrombin Time 12.4  11.6 - 15.2 seconds   INR 0.93  0.00 - 1.49  TROPONIN I     Status: None   Collection Time    12/05/12  1:47 AM      Result Value Range   Troponin I <0.30  <0.30 ng/mL   Comment:            Due to the release kinetics of cTnI,     a negative result within  the first hours     of the onset of symptoms does not rule out     myocardial infarction with certainty.     If myocardial infarction is still suspected,     repeat the test at appropriate intervals.  HEPARIN LEVEL (UNFRACTIONATED)     Status: None   Collection Time    12/05/12  7:10 AM      Result Value Range   Heparin Unfractionated 0.40  0.30 - 0.70 IU/mL   Comment:            IF HEPARIN RESULTS ARE BELOW     EXPECTED VALUES, AND PATIENT     DOSAGE HAS BEEN CONFIRMED,     SUGGEST FOLLOW UP TESTING     OF ANTITHROMBIN III LEVELS.  CBC     Status: Abnormal   Collection Time    12/05/12  7:10 AM      Result Value Range   WBC 6.4  4.0 - 10.5 K/uL   RBC 4.82  4.22 - 5.81 MIL/uL   Hemoglobin 14.5  13.0 - 17.0 g/dL   HCT 62.9  52.8 - 41.3 %   MCV 85.3  78.0 - 100.0 fL   MCH 30.1  26.0 - 34.0 pg   MCHC 35.3  30.0 - 36.0 g/dL   RDW 24.4  01.0 - 27.2 %   Platelets  122 (*) 150 - 400 K/uL  TROPONIN I     Status: None   Collection Time    12/05/12  7:10 AM      Result Value Range   Troponin I <0.30  <0.30 ng/mL   Comment:            Due to the release kinetics of cTnI,     a negative result within the first hours     of the onset of symptoms does not rule out     myocardial infarction with certainty.     If myocardial infarction is still suspected,     repeat the test at appropriate intervals.   Dg Chest 2 View  12/04/2012  *RADIOLOGY REPORT*  Clinical Data: 57 year old male chest pain shortness of breath.  CHEST - 2 VIEW  Comparison: 09/22/2006.  Findings: Lower cervical spine fusion hardware may be new and is partially visible.  Stable lung volumes.  Mild eventration of the diaphragm.  Cardiac size and mediastinal contours are within normal limits.  Visualized tracheal air column is within normal limits. No pneumothorax, pulmonary edema, pleural effusion or acute pulmonary opacity.  Stable small left midlung calcified granuloma. Right upper quadrant surgical clips re-identified. No acute osseous abnormality identified.  IMPRESSION: No acute cardiopulmonary abnormality.   Original Report Authenticated By: Erskine Speed, M.D.     @ROS @ [employer]:  Constitutional: Negative for malaise, fever and chills. No significant weight loss or weight gain  Eyes: Negative for eye pain, redness and discharge, diplopia, visual changes, or flashes of light.  ENT: Negative for ear pain, hoarseness, nasal congestion, sinus pressure and sore throat. No headaches; tinnitus, drooling, or problem swallowing.  Cardiovascular: Negative for palpitations, diaphoresis, dyspnea and peripheral edema. ; No orthopnea, PND  Respiratory: Negative for cough, hemoptysis, wheezing and stridor. No pleuritic chestpain.  Gastrointestinal: Negative for nausea, vomiting, diarrhea, constipation, melena, blood in stool, hematemesis, jaundice and rectal bleeding. + for reflux. Genitourinary:  Negative for frequency, dysuria, incontinence,flank pain and hematuria;  Musculoskeletal: Positive for back pain and neck pain. Negative for swelling and trauma.;  Skin:  Negative for pruritus, rash, abrasions, bruising and skin lesion, or ulcerations  Neuro: Negative for headache, lightheadedness and neck stiffness. Negative for weakness, altered level of consciousness , altered mental status, extremity weakness, burning feet, involuntary movement, seizure and syncope.  Psych: negative for anxiety, depression, insomnia, tearfulness, panic attacks, hallucinations, paranoia, suicidal or homicidal ideation   Blood pressure 115/76, pulse 51, temperature 97.8 F (36.6 C), temperature source Oral, resp. rate 18, height 5\' 11"  (1.803 m), weight 96.163 kg (212 lb), SpO2 95.00%. GEN: Pleasant patient lying in the stretcher in no acute distress; cooperative with exam.  PSYCH: alert and oriented x4; does not appear anxious or depressed; affect is appropriate.  HEENT: Mucous membranes pink and anicteric; PERRLA; EOM intact; no cervical lymphadenopathy nor thyromegaly or carotid bruit;   Neck: supple. No JVD. CHEST WALL: He does have mild tenderness along his xyphoid process.  CHEST: Normal respiration, clear to auscultation bilaterally.  HEART: Regular rate and rhythm. There are no murmur, rub, or gallops.  BACK: No kyphosis or scoliosis; no CVA tenderness  ABDOMEN: soft and non-tender; no masses, no organomegaly, normal abdominal bowel sounds; no pannus; no intertriginous candida. There is no rebound and no distention.  EXTREMITIES: No bone or joint deformity; age-appropriate arthropathy of the hands and knees; no edema; no ulcerations. There is no calf tenderness.   PULSES: 2+ and symmetric  SKIN: Normal hydration no rash or ulceration  CNS: Cranial nerves 2-12 grossly intact no focal lateralizing neurologic deficit. Speech is fluent; facial symmetry and tongue midline. Strengths are equaled bilaterally.  No sensory loss.   Assessment/Plan Unstable angina Hypertension Hyperlipidemia Tobacco use disorder GERD  C. Cath v/s Nuclear stress test. Patient wants to proceed with cardiac cath, understanding procedure and risks.  Cashmere Harmes S 12/05/2012, 8:49 AM

## 2012-12-05 NOTE — H&P (Signed)
[facility] History and Physical  BRANNAN CASSEDY RUE:[id number] DOB: August 03, 1956    PCP:   Sissy Hoff, MD   Chief Complaint: substernal chest pain and pressure at rest.  HPI: Russell Padilla is an 57 y.o. male, a retired Armed forces technical officer, with hx of chronic epigastric pain, hx of HTN, hyperlipidemia, s/p CCY, chronic upper neck and shoulder pain, s/p cervical fusion, presents to the ER with about one hour of substernal chest pressure and pain with mild diaphoresis.  He said this pain is different than his usual pain.  He denied any shortness of breath, prior similar pain, black or bloody stool.  He generally doesnot have any exertional chest pain or pressure.  In the ER, he was given morphine and pain is relieved.  Evaluation in the ER included an EKG which showed no acute ischemic changes.  His CXR is clear, troponin is negative, no leukocytosis, but his platelet count is 128K.  He has had history of thrombocytopenia, and denied any alcohol use.  Hospitalist was asked to admit him for chest pain suspicious for unstable angina or ACS.  Rewiew of Systems:  Constitutional: Negative for malaise, fever and chills. No significant weight loss or weight gain Eyes: Negative for eye pain, redness and discharge, diplopia, visual changes, or flashes of light. ENMT: Negative for ear pain, hoarseness, nasal congestion, sinus pressure and sore throat. No headaches; tinnitus, drooling, or problem swallowing. Cardiovascular: Negative for  palpitations, diaphoresis, dyspnea and peripheral edema. ; No orthopnea, PND Respiratory: Negative for cough, hemoptysis, wheezing and stridor. No pleuritic chestpain. Gastrointestinal: Negative for nausea, vomiting, diarrhea, constipation, melena, blood in stool, hematemesis, jaundice and rectal bleeding.    Genitourinary: Negative for frequency, dysuria, incontinence,flank pain and hematuria; Musculoskeletal: Negative for back pain and neck pain. Negative for  swelling and trauma.;  Skin: . Negative for pruritus, rash, abrasions, bruising and skin lesion.; ulcerations Neuro: Negative for headache, lightheadedness and neck stiffness. Negative for weakness, altered level of consciousness , altered mental status, extremity weakness, burning feet, involuntary movement, seizure and syncope.  Psych: negative for anxiety, depression, insomnia, tearfulness, panic attacks, hallucinations, paranoia, suicidal or homicidal ideation    Past Medical History  Diagnosis Date  . Renal disorder     renal calculi  . Stomach pain     chronic    Past Surgical History  Procedure Laterality Date  . Cholecystectomy    . Back surgery      2 cervical/ 2 lumbar fusions    Medications:  HOME MEDS: Prior to Admission medications   Medication Sig Start Date End Date Taking? Authorizing Provider  acidophilus (RISAQUAD) CAPS Take 1 capsule by mouth daily.   Yes Historical Provider, MD  aspirin EC 81 MG tablet Take 81 mg by mouth daily.   Yes Historical Provider, MD  cyclobenzaprine (FLEXERIL) 10 MG tablet Take 10 mg by mouth 3 (three) times daily as needed for muscle spasms.   Yes Historical Provider, MD  dicyclomine (BENTYL) 20 MG tablet Take 20 mg by mouth every 6 (six) hours.   Yes Historical Provider, MD  docusate sodium (COLACE) 100 MG capsule Take 100 mg by mouth at bedtime.   Yes Historical Provider, MD  hydrocortisone (ANUSOL-HC) 2.5 % rectal cream Place 1 application rectally as needed for hemorrhoids.   Yes Historical Provider, MD  mometasone (NASONEX) 50 MCG/ACT nasal spray Place 2 sprays into the nose at bedtime as needed (allergies).   Yes Historical Provider, MD  Multiple Vitamin (MULTIVITAMIN WITH MINERALS)  TABS Take 1 tablet by mouth daily.   Yes Historical Provider, MD  olmesartan (BENICAR) 40 MG tablet Take 40 mg by mouth every morning.   Yes Historical Provider, MD  oxyCODONE-acetaminophen (PERCOCET/ROXICET) 5-325 MG per tablet Take 1 tablet by mouth  every 4 (four) hours as needed for pain.   Yes Historical Provider, MD  pantoprazole (PROTONIX) 40 MG tablet Take 40 mg by mouth 2 (two) times daily.    Yes Historical Provider, MD  senna (SENOKOT) 8.6 MG TABS Take 1 tablet by mouth at bedtime.   Yes Historical Provider, MD  simvastatin (ZOCOR) 40 MG tablet Take 40 mg by mouth every evening.   Yes Historical Provider, MD     Allergies:  Allergies  Allergen Reactions  . Latex Hives and Rash    Social History:   reports that he has been smoking Cigarettes.  He has been smoking about 0.50 packs per day. He does not have any smokeless tobacco history on file. He reports that he does not drink alcohol or use illicit drugs.  Family History: No family history on file.   Physical Exam: Filed Vitals:   12/05/12 0000 12/05/12 0015 12/05/12 0030 12/05/12 0100  BP: 158/87 150/76 137/90 128/79  Pulse: 56 54 57 58  Temp:    98.3 F (36.8 C)  TempSrc:    Oral  Resp: 17 13 19 16   Height:      Weight:      SpO2: 100% 97% 96% 94%   Blood pressure 128/79, pulse 58, temperature 98.3 F (36.8 C), temperature source Oral, resp. rate 16, height 5\' 11"  (1.803 m), weight 96.163 kg (212 lb), SpO2 94.00%.  GEN:  Pleasant patient lying in the stretcher in no acute distress; cooperative with exam. PSYCH:  alert and oriented x4; does not appear anxious or depressed; affect is appropriate. HEENT: Mucous membranes pink and anicteric; PERRLA; EOM intact; no cervical lymphadenopathy nor thyromegaly or carotid bruit; no JVD; There were no stridor. Neck is very supple. Breasts:: Not examined CHEST WALL: He does have mild tenderness along his xyphoid process. CHEST: Normal respiration, clear to auscultation bilaterally.  HEART: Regular rate and rhythm.  There are no murmur, rub, or gallops.   BACK: No kyphosis or scoliosis; no CVA tenderness ABDOMEN: soft and non-tender; no masses, no organomegaly, normal abdominal bowel sounds; no pannus; no intertriginous  candida. There is no rebound and no distention. Rectal Exam: Not done EXTREMITIES: No bone or joint deformity; age-appropriate arthropathy of the hands and knees; no edema; no ulcerations.  There is no calf tenderness. Genitalia: not examined PULSES: 2+ and symmetric SKIN: Normal hydration no rash or ulceration CNS: Cranial nerves 2-12 grossly intact no focal lateralizing neurologic deficit.  Speech is fluent; uvula elevated with phonation, facial symmetry and tongue midline. DTR are normal bilaterally, cerebella exam is intact, barbinski is negative and strengths are equaled bilaterally.  No sensory loss.   Labs on Admission:  Basic Metabolic Panel:  Recent Labs Lab 12/04/12 1917 12/04/12 2144  NA 142 141  K 4.2 4.9  CL 106 106  CO2 27  --   GLUCOSE 117* 88  BUN 16 16  CREATININE 1.32 1.30  CALCIUM 9.4  --    Liver Function Tests:  Recent Labs Lab 12/04/12 1917  AST 18  ALT 18  ALKPHOS 79  BILITOT 0.3  PROT 6.9  ALBUMIN 3.8   No results found for this basename: LIPASE, AMYLASE,  in the last 168 hours No results found  for this basename: AMMONIA,  in the last 168 hours CBC:  Recent Labs Lab 12/04/12 1917 12/04/12 2144  WBC 7.0  --   HGB 15.7 15.6  HCT 44.6 46.0  MCV 87.8  --   PLT 128*  --    Cardiac Enzymes:  Recent Labs Lab 12/04/12 2139 12/05/12 0147  TROPONINI <0.30 <0.30    CBG: No results found for this basename: GLUCAP,  in the last 168 hours   Radiological Exams on Admission: Dg Chest 2 View  12/04/2012  *RADIOLOGY REPORT*  Clinical Data: 57 year old male chest pain shortness of breath.  CHEST - 2 VIEW  Comparison: 09/22/2006.  Findings: Lower cervical spine fusion hardware may be new and is partially visible.  Stable lung volumes.  Mild eventration of the diaphragm.  Cardiac size and mediastinal contours are within normal limits.  Visualized tracheal air column is within normal limits. No pneumothorax, pulmonary edema, pleural effusion or acute  pulmonary opacity.  Stable small left midlung calcified granuloma. Right upper quadrant surgical clips re-identified. No acute osseous abnormality identified.  IMPRESSION: No acute cardiopulmonary abnormality.   Original Report Authenticated By: Erskine Speed, M.D.     EKG: Independently reviewed. SR with no acute ischemic or injury pattern.   Assessment/Plan Present on Admission:  . Chest pain at rest . HYPERTENSION . HYPERLIPIDEMIA . GERD   PLAN: I am concerned that this may represent true unstable angina or ACS.  He does have significant cardiac risk factors including HTN, Hyperlipidemia, tobacco use, and family hx of premature CAD.  Will start him on full IV heparin, along with continuing him on ASA.  He will be continued on statin.  I have not added betablocker as his HR is in the 50's to 60's.  He is pain free at this time.  I think it would be safe as his low platelet count is not severe.  It is over 100K.  Will cycle his troponin.  He will likely need further ischemic work up, so please consult cardiology tomorrow.  His point tenderness over the epigastric area is totally a different pain.  He is stable, full code, and will be admitted to [facility] service.  Thank you for allowing me to partake in the care of your nice patient.  Other plans as per orders.  Code Status: FULL Unk Lightning, MD. [facility] Pager [phone] 7pm to 7am.  12/05/2012, 3:48 AM

## 2012-12-06 ENCOUNTER — Encounter (HOSPITAL_COMMUNITY)
Admission: EM | Disposition: A | Discharge status: Home / Self Care | Payer: Self-pay | Source: Home / Self Care | Attending: Family Medicine

## 2012-12-06 DIAGNOSIS — I2510 Atherosclerotic heart disease of native coronary artery without angina pectoris: Secondary | ICD-10-CM

## 2012-12-06 HISTORY — PX: PERCUTANEOUS CORONARY STENT INTERVENTION (PCI-S): SHX5485

## 2012-12-06 LAB — CBC
MCHC: 35 g/dL (ref 30.0–36.0)
Platelets: 134 10*3/uL — ABNORMAL LOW (ref 150–400)
RDW: 13.8 % (ref 11.5–15.5)
WBC: 13.5 10*3/uL — ABNORMAL HIGH (ref 4.0–10.5)

## 2012-12-06 LAB — TECHNOLOGIST SMEAR REVIEW

## 2012-12-06 LAB — HEPARIN LEVEL (UNFRACTIONATED): Heparin Unfractionated: 0.28 IU/mL — ABNORMAL LOW (ref 0.30–0.70)

## 2012-12-06 LAB — BASIC METABOLIC PANEL
BUN: 18 mg/dL (ref 6–23)
GFR calc Af Amer: 86 mL/min — ABNORMAL LOW (ref >90)
GFR calc non Af Amer: 74 mL/min — ABNORMAL LOW (ref >90)
Potassium: 4 mEq/L (ref 3.5–5.1)
Sodium: 138 mEq/L (ref 135–145)

## 2012-12-06 SURGERY — PERCUTANEOUS CORONARY STENT INTERVENTION (PCI-S)
Anesthesia: LOCAL

## 2012-12-06 MED ORDER — HEPARIN (PORCINE) IN NACL 2-0.9 UNIT/ML-% IJ SOLN
INTRAMUSCULAR | Status: AC
  Filled 2012-12-06: qty 1000

## 2012-12-06 MED ORDER — ASPIRIN 81 MG PO CHEW
324.0000 mg | CHEWABLE_TABLET | ORAL | Status: AC
  Administered 2012-12-06: 324 mg via ORAL
  Filled 2012-12-06: qty 4

## 2012-12-06 MED ORDER — CLOPIDOGREL BISULFATE 300 MG PO TABS
ORAL_TABLET | ORAL | Status: AC
  Administered 2012-12-07: 75 mg via ORAL
  Filled 2012-12-06: qty 2

## 2012-12-06 MED ORDER — ALUM & MAG HYDROXIDE-SIMETH 200-200-20 MG/5ML PO SUSP
30.0000 mL | ORAL | Status: DC | PRN
  Administered 2012-12-06: 30 mL via ORAL

## 2012-12-06 MED ORDER — ATORVASTATIN CALCIUM 20 MG PO TABS
20.0000 mg | ORAL_TABLET | Freq: Every day | ORAL | Status: DC
  Filled 2012-12-06 (×3): qty 1

## 2012-12-06 MED ORDER — BIVALIRUDIN 250 MG IV SOLR
INTRAVENOUS | Status: AC
  Filled 2012-12-06: qty 250

## 2012-12-06 MED ORDER — MIDAZOLAM HCL 2 MG/2ML IJ SOLN
INTRAMUSCULAR | Status: AC
  Filled 2012-12-06: qty 2

## 2012-12-06 MED ORDER — SODIUM CHLORIDE 0.9 % IJ SOLN
3.0000 mL | Freq: Two times a day (BID) | INTRAMUSCULAR | Status: DC
  Administered 2012-12-06: 3 mL via INTRAVENOUS

## 2012-12-06 MED ORDER — FAMOTIDINE IN NACL 20-0.9 MG/50ML-% IV SOLN
INTRAVENOUS | Status: AC
  Filled 2012-12-06: qty 50

## 2012-12-06 MED ORDER — ALUM & MAG HYDROXIDE-SIMETH 200-200-20 MG/5ML PO SUSP
ORAL | Status: AC
  Filled 2012-12-06: qty 30

## 2012-12-06 MED ORDER — ALUM & MAG HYDROXIDE-SIMETH 200-200-20 MG/5ML PO SUSP
30.0000 mL | ORAL | Status: DC | PRN
  Administered 2012-12-06: 30 mL via ORAL
  Filled 2012-12-06: qty 30

## 2012-12-06 MED ORDER — NITROGLYCERIN 1 MG/10 ML FOR IR/CATH LAB
INTRA_ARTERIAL | Status: AC
  Filled 2012-12-06: qty 10

## 2012-12-06 MED ORDER — FENTANYL CITRATE 0.05 MG/ML IJ SOLN
INTRAMUSCULAR | Status: AC
  Filled 2012-12-06: qty 2

## 2012-12-06 MED ORDER — SODIUM CHLORIDE 0.9 % IV SOLN: INTRAVENOUS | Status: AC

## 2012-12-06 MED ORDER — VERAPAMIL HCL 2.5 MG/ML IV SOLN
INTRAVENOUS | Status: AC
  Filled 2012-12-06: qty 2

## 2012-12-06 MED ORDER — SODIUM CHLORIDE 0.9 % IV SOLN: 250.0000 mL | INTRAVENOUS | Status: DC | PRN

## 2012-12-06 MED ORDER — LIDOCAINE HCL (PF) 1 % IJ SOLN
INTRAMUSCULAR | Status: AC
  Filled 2012-12-06: qty 30

## 2012-12-06 MED ORDER — SODIUM CHLORIDE 0.9 % IJ SOLN: 3.0000 mL | INTRAMUSCULAR | Status: DC | PRN

## 2012-12-06 MED ORDER — SODIUM CHLORIDE 0.9 % IV SOLN
1.0000 mL/kg/h | INTRAVENOUS | Status: DC
  Administered 2012-12-06: 1 mL/kg/h via INTRAVENOUS

## 2012-12-06 MED ORDER — AMLODIPINE BESYLATE 5 MG PO TABS
5.0000 mg | ORAL_TABLET | Freq: Every day | ORAL | Status: DC
  Administered 2012-12-06 – 2012-12-07 (×2): 5 mg via ORAL
  Filled 2012-12-06 (×2): qty 1

## 2012-12-06 MED ORDER — ASPIRIN EC 81 MG PO TBEC
81.0000 mg | DELAYED_RELEASE_TABLET | Freq: Every day | ORAL | Status: DC
  Administered 2012-12-07: 10:00:00 81 mg via ORAL
  Filled 2012-12-06: qty 1

## 2012-12-06 NOTE — Progress Notes (Signed)
Clinically equivalent dose of Lipitor has been substituted for Zocor per [facility] P&T committee policy.  [facility] formulary substitution of Atorvastatin 20mg  for Simvastatin 40 mg in patient also receiving Amlodipine. Amlodipine started 12/07/11. Simvastatin can not exceed 20mg  per day when also taking Amlodipine due to dose related risk of myopathy/rhabdomyolysis.   Consider this change at discharge.  Atorvastatin instead of simvastatin if amlodipine continued at discharge.   Noah Delaine, RPh Clinical Pharmacist Pager: [phone] 12/06/2012 09:35 AM

## 2012-12-06 NOTE — Interval H&P Note (Signed)
History and Physical Interval Note:  12/06/2012 2:21 PM  Russell Padilla  has presented today for PCI. 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 Algie Coffer during cath yesterday. I have been asked to perform PCI of the OM system.  The various methods of treatment have been discussed with the patient and family. After consideration of risks, benefits and other options for treatment, the patient has consented to  Procedure(s): PERCUTANEOUS CORONARY STENT INTERVENTION (PCI-S) (N/A) as a surgical intervention .  The patient's history has been reviewed, patient examined, no change in status, stable for surgery.  I have reviewed the patient's chart and labs.  Questions were answered to the patient's satisfaction.     MCALHANY,CHRISTOPHER

## 2012-12-06 NOTE — Progress Notes (Signed)
ANTICOAGULATION CONSULT NOTE - Follow Up Consult  Pharmacy Consult for heparin Indication: awaiting PCI  Labs:  Recent Labs  12/04/12 1917 12/04/12 2139 12/04/12 2144 12/04/12 2326 12/05/12 0147 12/05/12 0710 12/05/12 1340 12/05/12 1735 12/06/12 0522  HGB 15.7  --  15.6  --   --  14.5  --   --  13.9  HCT 44.6  --  46.0  --   --  41.1  --   --  39.7  PLT 128*  --   --   --   --  122*  --   --  134*  LABPROT  --   --   --  12.4  --   --   --   --   --   INR  --   --   --  0.93  --   --   --   --   --   HEPARINUNFRC  --   --   --   --   --  0.40 <0.10*  --  0.28*  CREATININE 1.32  --  1.30  --   --   --   --  1.12  --   TROPONINI  --  <0.30  --   --  <0.30 <0.30  --   --   --     Assessment: 56yo male slightly subtherapeutic on heparin after resumed post cath, scheduled for PCI this pm.  Goal of Therapy:  Heparin level 0.3-0.7 units/ml   Plan:  Will increase heparin gtt by 1 unit/kg/hr to 1400 units/hr until PCI.  Vernard Gambles, PharmD, BCPS  12/06/2012,6:25 AM

## 2012-12-06 NOTE — Progress Notes (Signed)
TR BAND REMOVAL  LOCATION:  right radial  DEFLATED PER PROTOCOL:  yes  TIME BAND OFF / DRESSING APPLIED:   1845   SITE UPON ARRIVAL:   Level 0  SITE AFTER BAND REMOVAL:  Level 0  REVERSE ALLEN'S TEST:    positive  CIRCULATION SENSATION AND MOVEMENT:  Within Normal Limits  yes  COMMENTS:  On arrival with band on C/O numbness tingling to fingertips after band removed this resolved patient is comfortable.

## 2012-12-06 NOTE — Progress Notes (Signed)
Nutrition Brief Note   RD consulted for Heart Healthy diet education.  Patient currently in Cath Lab.  RD to re-attempt visitation at more appropriate time.  Maureen Chatters, RD, LDN Pager #: [phone] After-Hours Pager #: [phone]

## 2012-12-06 NOTE — Progress Notes (Signed)
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 Mild left main coronary artery disease. 2. Moderate left anterior descending artery and its branches. 3. Mild left circumflex artery and moderate to severe disease of OM 2 branches. 4. Non-dominant right coronary artery with severe disease. 5. Mild left ventricular systolic dysfunction. LVEDP 15 mmHg. Ejection fraction 50-60 %.  Antibiotics:  None   Subjective  Headache and low blood pressures earlier today, now both better with withdrawal of nitrate. Not hungry but no chest pain no nausea vomiting. Ambulated a little bit to restroom earlier today   Objective    Interim History: none  Telemetry: Since rhythm with some bradycardic in the 50s and 60s  Objective: Filed Vitals:   12/05/12 1353 12/05/12 2100 12/06/12 0500 12/06/12 1127  BP: 113/49 125/73 111/61 114/50  Pulse: 53 83 67   Temp: 98 F (36.7 C) 98.3 F (36.8 C) 98.2 F (36.8 C)   TempSrc: Oral     Resp: 17 18 18    Height:      Weight:      SpO2: 95% 93% 94%     Intake/Output Summary (Last 24 hours) at 12/06/12 1237 Last data filed at 12/06/12 1000  Gross per 24 hour  Intake    120 ml  Output      0 ml  Net    120 ml    Exam:  General: Alert  pleasant oriented Caucasian male in no apparent distress Cardiovascular: Some S2 no murmur rub or Respiratory: Clinically clear Abdomen: Soft nontender Skin no rash   Data Reviewed: Basic Metabolic Panel:  Recent Labs Lab 12/04/12 1917 12/04/12 2144 12/05/12 1735 12/06/12 0530  NA 142 141 139 138  K 4.2 4.9 4.3 4.0  CL 106 106 103 103  CO2 27  --  24 27  GLUCOSE 117* 88 153* 123*  BUN 16 16 17 18   CREATININE 1.32 1.30 1.12 1.08  CALCIUM 9.4  --  9.8 9.2   Liver Function Tests:  Recent Labs Lab 12/04/12 1917  AST 18  ALT 18  ALKPHOS 79  BILITOT 0.3  PROT 6.9  ALBUMIN 3.8   No results found for this basename: LIPASE, AMYLASE,  in the last 168 hours No results found for this basename: AMMONIA,  in the last 168 hours CBC:  Recent Labs Lab 12/04/12 1917 12/04/12 2144 12/05/12 0710 12/06/12 0522  WBC 7.0  --  6.4 13.5*  HGB 15.7 15.6 14.5 13.9  HCT 44.6 46.0 41.1 39.7  MCV 87.8  --  85.3 87.1  PLT 128*  --  122* 134*   Cardiac Enzymes:  Recent Labs Lab 12/04/12 2139 12/05/12 0147 12/05/12 0710  TROPONINI <0.30 <0.30 <0.30   BNP: No components found with this basename: POCBNP,  CBG: No results found for this basename: GLUCAP,  in the last 168 hours  No results found for this or any previous visit (from the past 240  hour(s)).   Studies:              All Imaging reviewed and is as per above notation   Scheduled Meds: . acidophilus  1 capsule Oral Daily  . amLODipine  5 mg Oral Daily  . aspirin EC  325 mg Oral Daily  . atorvastatin  20 mg Oral q1800  . bisacodyl  10 mg Rectal Once  . clopidogrel  75 mg Oral Q breakfast  . dicyclomine  20 mg Oral Q6H  . docusate sodium  100 mg Oral BID  . fluticasone  1 spray Each Nare Daily  . irbesartan  75 mg Oral Daily  . multivitamin with minerals  1 tablet Oral Daily  . pantoprazole  40 mg Oral BID  . sodium chloride  3 mL Intravenous Q12H  . sodium chloride  3 mL Intravenous Q12H   Continuous  Infusions: . sodium chloride 100 mL/hr at 12/05/12 1256  . sodium chloride    . sodium chloride 1 mL/kg/hr (12/06/12 0445)  . heparin 1,400 Units/hr (12/06/12 0654)     Assessment/Plan: 1. Chest pain-cardiac catheterization performed 12/05/2012 showing nondominant right coronary artery with severe disease, and mild coronary disease elsewher.-[facility] has been notified as per patient family request and will assume Cardiac care in am-continue Plavix 75 daily, aspirin 81 daily. Continue heparin IV.   cardiology to see patient to explain plan of care prior to intervention and are aware of patient 2. Abdominal pain-unclear etiology. Will need to be seen in the future by Dr. Stark-currently stable   3. Chronic TCP-Seem stable.  Unclear if #2 and #3 are related.  Get peripheral smear.   4. Multiple back surgeries-currently stable  5. History hypertension-currently stable. Would add beta blocker in one to 2 days subsequent to intervention-cut back dose of avapro from 300-->75 mg daily  6. 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 All a.m. basic metabolic panel   Code Status: Full Family Communication: Wife at bedside Disposition Plan: inpatient, pending cardiology input   Pleas Koch, MD  [facility] Pager [phone] 12/06/2012, 12:37 PM    LOS: 2 days

## 2012-12-06 NOTE — H&P (View-Only) (Signed)
Subjective:  Complaints of severe headache. On NTG paste.  Objective:  Vital Signs in the last 24 hours: Temp:  [98 F (36.7 C)-98.3 F (36.8 C)] 98.2 F (36.8 C) (03/06 0500) Pulse Rate:  [53-83] 67 (03/06 0500) Cardiac Rhythm:  [-] Normal sinus rhythm (03/05 2046) Resp:  [17-18] 18 (03/06 0500) BP: (111-125)/(49-73) 111/61 mmHg (03/06 0500) SpO2:  [93 %-95 %] 94 % (03/06 0500)  Physical Exam: BP Readings from Last 1 Encounters:  12/06/12 111/61     Wt Readings from Last 1 Encounters:  12/04/12 96.163 kg (212 lb)    Weight change:   HEENT: [place]/AT, Eyes-Hazel, PERL, EOMI, Conjunctiva-Pink, Sclera-Non-icteric Neck: No JVD, No bruit, Trachea midline. Lungs:  Clear, Bilateral. Cardiac:  Regular rhythm, normal S1 and S2, no S3.  Abdomen:  Soft, non-tender. Extremities:  No edema present. No cyanosis. No clubbing. CNS: AxOx3, Cranial nerves grossly intact, moves all 4 extremities. Right handed. Skin: Warm and dry.   Intake/Output from previous day:      Lab Results: BMET    Component Value Date/Time   NA 138 12/06/2012 0530   K 4.0 12/06/2012 0530   CL 103 12/06/2012 0530   CO2 27 12/06/2012 0530   GLUCOSE 123* 12/06/2012 0530   BUN 18 12/06/2012 0530   CREATININE 1.08 12/06/2012 0530   CALCIUM 9.2 12/06/2012 0530   GFRNONAA 74* 12/06/2012 0530   GFRAA 86* 12/06/2012 0530   CBC    Component Value Date/Time   WBC 13.5* 12/06/2012 0522   RBC 4.56 12/06/2012 0522   HGB 13.9 12/06/2012 0522   HCT 39.7 12/06/2012 0522   PLT 134* 12/06/2012 0522   MCV 87.1 12/06/2012 0522   MCH 30.5 12/06/2012 0522   MCHC 35.0 12/06/2012 0522   RDW 13.8 12/06/2012 0522   LYMPHSABS 1.8 11/02/2010 1558   MONOABS 0.5 11/02/2010 1558   EOSABS 0.2 11/02/2010 1558   BASOSABS 0.0 11/02/2010 1558   CARDIAC ENZYMES Lab Results  Component Value Date   TROPONINI <0.30 12/05/2012    Scheduled Meds: . acidophilus  1 capsule Oral Daily  . amLODipine  5 mg Oral Daily  . aspirin EC  325 mg Oral Daily  . bisacodyl  10 mg  Rectal Once  . clopidogrel  75 mg Oral Q breakfast  . dicyclomine  20 mg Oral Q6H  . docusate sodium  100 mg Oral BID  . fluticasone  1 spray Each Nare Daily  . irbesartan  75 mg Oral Daily  . multivitamin with minerals  1 tablet Oral Daily  . pantoprazole  40 mg Oral BID  . simvastatin  40 mg Oral QPM  . sodium chloride  3 mL Intravenous Q12H  . sodium chloride  3 mL Intravenous Q12H   Continuous Infusions: . sodium chloride 100 mL/hr at 12/05/12 1256  . sodium chloride    . sodium chloride 1 mL/kg/hr (12/06/12 0445)  . heparin 1,400 Units/hr (12/06/12 0654)   PRN Meds:.sodium chloride, sodium chloride, acetaminophen, cyclobenzaprine, diazepam, hydrocortisone, magnesium hydroxide, morphine injection, ondansetron (ZOFRAN) IV, ondansetron, oxyCODONE-acetaminophen, sodium chloride, sodium chloride  Assessment/Plan: Unstable angina  Multivessel, native vessel CAD Hypertension  Hyperlipidemia  Tobacco use disorder  GERD Headache and low blood pressure from NTG paste.  Change NTG paste to amlodipine. Awaiting angioplasty to OM/LCX +/- RCA(non-dominant)    LOS: 2 days    Ajay Kadakia  MD  12/06/2012, 9:10 AM      

## 2012-12-06 NOTE — Progress Notes (Signed)
Spoke with pt and family regarding cath. Answered questions. They request a nutrition consult for heart-healthy eating. Will order. Will also order cardiac rehab to see.   Theodore Demark, PA-C

## 2012-12-06 NOTE — CV Procedure (Signed)
    Cardiac Catheterization Operative Report  Russell Padilla [id number] 3/6/20143:17 PM Sissy Hoff, MD  Procedure Performed:  1. PTCA/DES x 1 second obtuse marginal branch of Circumflex  Operator: Verne Carrow, MD  Arterial access site:  Right radial artery.   Indication: 57 yo male with unstable angina (class III) with severe stenosis distal obtuse marginal branch by cath with Dr. Noni Saupe yesterday. I have been asked to perform PCI of the Circumflex.                                    Procedure Details: The risks, benefits, complications, treatment options, and expected outcomes were discussed with the patient. The patient and/or family concurred with the proposed plan, giving informed consent. The patient was brought to the cath lab after IV hydration was begun and oral premedication was given. The patient was further sedated with Versed and Fentanyl. The right wrist was assessed with an Allens test which was positive. The right wrist was prepped and draped in a sterile fashion. 1% lidocaine was used for local anesthesia. Using the modified Seldinger access technique, a 6 French sheath was placed in the right radial artery. 3 mg Verapamil was given through the sheath. A bolus of Angiomax was given and a drip was started. The left main was engaged with a XB 3.5 guiding catheter. When the ACT was greater than 200, I passed a BMW wire down the Circumflex into the second obtuse marginal artery. I then used a 2.0 x 12 mm balloon to pre-dilate the stenosis. I then deployed a 2.5 x 12 mm Promus Premier DES in the most superior branch of OM2. This was post-dilated with a 2.75 x 9 mm [place] balloon x 1. There was an excellent result. THe stenosis was taken from 90% down to 0%. The sheath was removed from the right radial artery and a Terumo hemostasis band was applied at the arteriotomy site on the right wrist.   Of note, he was given 600 mg po Plavix x 1 on the cath table. Angiomax was stopped  on the cath table.   There were no immediate complications. The patient was taken to the recovery area in stable condition.   Hemodynamic Findings: Central aortic pressure: 107/64  Impression: 1. Unstable angina with severe stenosis second obtuse marginal  2. Successful placement of a DES in the second obtuse marginal branch 3. The RCA is a small non-dominant vessel. I have reviewed the films with my interventional colleagues and we will recommend medical management only for the small non-dominant RCA.   Recommendations: He will need dual anti-platelet therapy with ASA and Plavix for one year. Continue statin.  Start beta blocker.        Complications:  None. The patient tolerated the procedure well.

## 2012-12-06 NOTE — Progress Notes (Signed)
Subjective:  Complaints of severe headache. On NTG paste.  Objective:  Vital Signs in the last 24 hours: Temp:  [98 F (36.7 C)-98.3 F (36.8 C)] 98.2 F (36.8 C) (03/06 0500) Pulse Rate:  [53-83] 67 (03/06 0500) Cardiac Rhythm:  [-] Normal sinus rhythm (03/05 2046) Resp:  [17-18] 18 (03/06 0500) BP: (111-125)/(49-73) 111/61 mmHg (03/06 0500) SpO2:  [93 %-95 %] 94 % (03/06 0500)  Physical Exam: BP Readings from Last 1 Encounters:  12/06/12 111/61     Wt Readings from Last 1 Encounters:  12/04/12 96.163 kg (212 lb)    Weight change:   HEENT: [place]/AT, Eyes-Hazel, PERL, EOMI, Conjunctiva-Pink, Sclera-Non-icteric Neck: No JVD, No bruit, Trachea midline. Lungs:  Clear, Bilateral. Cardiac:  Regular rhythm, normal S1 and S2, no S3.  Abdomen:  Soft, non-tender. Extremities:  No edema present. No cyanosis. No clubbing. CNS: AxOx3, Cranial nerves grossly intact, moves all 4 extremities. Right handed. Skin: Warm and dry.   Intake/Output from previous day:      Lab Results: BMET    Component Value Date/Time   NA 138 12/06/2012 0530   K 4.0 12/06/2012 0530   CL 103 12/06/2012 0530   CO2 27 12/06/2012 0530   GLUCOSE 123* 12/06/2012 0530   BUN 18 12/06/2012 0530   CREATININE 1.08 12/06/2012 0530   CALCIUM 9.2 12/06/2012 0530   GFRNONAA 74* 12/06/2012 0530   GFRAA 86* 12/06/2012 0530   CBC    Component Value Date/Time   WBC 13.5* 12/06/2012 0522   RBC 4.56 12/06/2012 0522   HGB 13.9 12/06/2012 0522   HCT 39.7 12/06/2012 0522   PLT 134* 12/06/2012 0522   MCV 87.1 12/06/2012 0522   MCH 30.5 12/06/2012 0522   MCHC 35.0 12/06/2012 0522   RDW 13.8 12/06/2012 0522   LYMPHSABS 1.8 11/02/2010 1558   MONOABS 0.5 11/02/2010 1558   EOSABS 0.2 11/02/2010 1558   BASOSABS 0.0 11/02/2010 1558   CARDIAC ENZYMES Lab Results  Component Value Date   TROPONINI <0.30 12/05/2012    Scheduled Meds: . acidophilus  1 capsule Oral Daily  . amLODipine  5 mg Oral Daily  . aspirin EC  325 mg Oral Daily  . bisacodyl  10 mg  Rectal Once  . clopidogrel  75 mg Oral Q breakfast  . dicyclomine  20 mg Oral Q6H  . docusate sodium  100 mg Oral BID  . fluticasone  1 spray Each Nare Daily  . irbesartan  75 mg Oral Daily  . multivitamin with minerals  1 tablet Oral Daily  . pantoprazole  40 mg Oral BID  . simvastatin  40 mg Oral QPM  . sodium chloride  3 mL Intravenous Q12H  . sodium chloride  3 mL Intravenous Q12H   Continuous Infusions: . sodium chloride 100 mL/hr at 12/05/12 1256  . sodium chloride    . sodium chloride 1 mL/kg/hr (12/06/12 0445)  . heparin 1,400 Units/hr (12/06/12 0654)   PRN Meds:.sodium chloride, sodium chloride, acetaminophen, cyclobenzaprine, diazepam, hydrocortisone, magnesium hydroxide, morphine injection, ondansetron (ZOFRAN) IV, ondansetron, oxyCODONE-acetaminophen, sodium chloride, sodium chloride  Assessment/Plan: Unstable angina  Multivessel, native vessel CAD Hypertension  Hyperlipidemia  Tobacco use disorder  GERD Headache and low blood pressure from NTG paste.  Change NTG paste to amlodipine. Awaiting angioplasty to OM/LCX +/- RCA(non-dominant)    LOS: 2 days    Orpah Cobb  MD  12/06/2012, 9:10 AM

## 2012-12-07 LAB — BASIC METABOLIC PANEL
BUN: 18 mg/dL (ref 6–23)
Calcium: 9.3 mg/dL (ref 8.4–10.5)
Creatinine, Ser: 1.2 mg/dL (ref 0.50–1.35)
GFR calc non Af Amer: 65 mL/min — ABNORMAL LOW (ref >90)
Glucose, Bld: 88 mg/dL (ref 70–99)
Sodium: 141 mEq/L (ref 135–145)

## 2012-12-07 LAB — CBC
Hemoglobin: 14.1 g/dL (ref 13.0–17.0)
MCH: 30.4 pg (ref 26.0–34.0)
MCHC: 35.6 g/dL (ref 30.0–36.0)
MCV: 85.3 fL (ref 78.0–100.0)

## 2012-12-07 MED ORDER — NITROGLYCERIN 0.4 MG SL SUBL: 0.4000 mg | SUBLINGUAL_TABLET | SUBLINGUAL | Status: DC | PRN

## 2012-12-07 MED ORDER — ALPRAZOLAM 0.25 MG PO TABS: 0.2500 mg | ORAL_TABLET | Freq: Two times a day (BID) | ORAL | Status: DC | PRN

## 2012-12-07 MED ORDER — CLOPIDOGREL BISULFATE 75 MG PO TABS: 75.0000 mg | ORAL_TABLET | Freq: Every day | ORAL | Status: DC

## 2012-12-07 MED ORDER — SIMVASTATIN 40 MG PO TABS: 40.0000 mg | ORAL_TABLET | Freq: Every evening | ORAL | Status: DC

## 2012-12-07 MED ORDER — METOPROLOL SUCCINATE ER 25 MG PO TB24
25.0000 mg | ORAL_TABLET | Freq: Every day | ORAL | Status: DC
  Administered 2012-12-07: 13:00:00 25 mg via ORAL
  Filled 2012-12-07: qty 1

## 2012-12-07 MED ORDER — OLMESARTAN MEDOXOMIL 40 MG PO TABS: 20.0000 mg | ORAL_TABLET | Freq: Every morning | ORAL | Status: DC

## 2012-12-07 MED ORDER — AMLODIPINE BESYLATE 5 MG PO TABS: 2.5000 mg | ORAL_TABLET | Freq: Every day | ORAL | Status: DC

## 2012-12-07 MED ORDER — METOPROLOL SUCCINATE ER 25 MG PO TB24: 25.0000 mg | ORAL_TABLET | Freq: Every day | ORAL | Status: DC

## 2012-12-07 NOTE — Progress Notes (Signed)
Subjective:  No chest pain. Had lot of questions on medications, diet and activity and follow-up.  Objective:  Vital Signs in the last 24 hours: Temp:  [97.5 F (36.4 C)-98.3 F (36.8 C)] 97.9 F (36.6 C) (03/07 0800) Pulse Rate:  [50-61] 57 (03/07 0800) Cardiac Rhythm:  [-] Sinus bradycardia (03/07 0800) Resp:  [17-18] 18 (03/07 0800) BP: (111-140)/(50-79) 111/66 mmHg (03/07 0800) SpO2:  [95 %-100 %] 95 % (03/07 0800) Weight:  [99.6 kg (219 lb 9.3 oz)] 99.6 kg (219 lb 9.3 oz) (03/07 0050)  Physical Exam: BP Readings from Last 1 Encounters:  12/07/12 111/66     Wt Readings from Last 1 Encounters:  12/07/12 99.6 kg (219 lb 9.3 oz)    Weight change:   HEENT: [place]/AT, Eyes-Brown, PERL, EOMI, Conjunctiva-Pink, Sclera-Non-icteric Neck: No JVD, No bruit, Trachea midline. Lungs:  Clear, Bilateral. Cardiac:  Regular rhythm, normal S1 and S2, no S3.  Abdomen:  Soft, non-tender. Extremities:  No edema present. No cyanosis. No clubbing. CNS: AxOx3, Cranial nerves grossly intact, moves all 4 extremities. Right handed. Skin: Warm and dry.   Intake/Output from previous day: 03/06 0701 - 03/07 0700 In: 457.5 [P.O.:120; I.V.:337.5] Out: -     Lab Results: BMET    Component Value Date/Time   NA 141 12/07/2012 0744   K 3.9 12/07/2012 0744   CL 103 12/07/2012 0744   CO2 27 12/07/2012 0744   GLUCOSE 88 12/07/2012 0744   BUN 18 12/07/2012 0744   CREATININE 1.20 12/07/2012 0744   CALCIUM 9.3 12/07/2012 0744   GFRNONAA 65* 12/07/2012 0744   GFRAA 76* 12/07/2012 0744   CBC    Component Value Date/Time   WBC 7.6 12/07/2012 0744   RBC 4.64 12/07/2012 0744   HGB 14.1 12/07/2012 0744   HCT 39.6 12/07/2012 0744   PLT 121* 12/07/2012 0744   MCV 85.3 12/07/2012 0744   MCH 30.4 12/07/2012 0744   MCHC 35.6 12/07/2012 0744   RDW 14.3 12/07/2012 0744   LYMPHSABS 1.8 11/02/2010 1558   MONOABS 0.5 11/02/2010 1558   EOSABS 0.2 11/02/2010 1558   BASOSABS 0.0 11/02/2010 1558   CARDIAC ENZYMES Lab Results  Component Value Date    TROPONINI <0.30 12/05/2012    Scheduled Meds: . acidophilus  1 capsule Oral Daily  . amLODipine  5 mg Oral Daily  . aspirin EC  81 mg Oral Daily  . atorvastatin  20 mg Oral q1800  . bisacodyl  10 mg Rectal Once  . clopidogrel  75 mg Oral Q breakfast  . dicyclomine  20 mg Oral Q6H  . docusate sodium  100 mg Oral BID  . fluticasone  1 spray Each Nare Daily  . irbesartan  75 mg Oral Daily  . multivitamin with minerals  1 tablet Oral Daily  . pantoprazole  40 mg Oral BID  . sodium chloride  3 mL Intravenous Q12H   Continuous Infusions:  PRN Meds:.sodium chloride, acetaminophen, alum & mag hydroxide-simeth, cyclobenzaprine, diazepam, hydrocortisone, magnesium hydroxide, morphine injection, ondansetron (ZOFRAN) IV, ondansetron, oxyCODONE-acetaminophen, sodium chloride  Assessment/Plan:  Unstable angina  Multivessel, native vessel CAD  S/P LCX stent Hypertension  Hyperlipidemia  Tobacco use disorder  GERD  Add small dose B-blocker as tolerated May f/u with [facility]/Dr. Clifton James   LOS: 3 days    Orpah Cobb  MD  12/07/2012, 11:49 AM

## 2012-12-07 NOTE — Plan of Care (Signed)
Problem: Food- and Nutrition-Related Knowledge Deficit (NB-1.1) Goal: Nutrition education Formal process to instruct or train a patient/client in a skill or to impart knowledge to help patients/clients voluntarily manage or modify food choices and eating behavior to maintain or improve health. Outcome: Completed/Met Date Met:  12/07/12 Nutrition Education Note  RD consulted for nutrition education regarding a Heart Healthy diet.   Pt has been eating a regular diet with no restrictions PTA. Pt reports that he is willing to make changes. He and his wife are retired and [employer] much, both seem receptive to making changes. Pt eats a large full Breakfast everyday. Pt is willing to have further diet education after d/c. Would prefer [facility], will notify RD.   RD provided "Heart Healthy Nutrition Therapy" handout from the Academy of [employer]. Reviewed patient's dietary recall. Provided examples on ways to decrease sodium and fat intake in diet. Discouraged intake of processed foods and use of salt shaker. Encouraged fresh fruits and vegetables as well as whole grain sources of carbohydrates to maximize fiber intake. Teach back method used.  Expect fair compliance.  Body mass index is 30.64 kg/(m^2). Pt meets criteria for Obesity Class I based on current BMI.  Current diet order is Heart Healthy, patient is consuming approximately 100% of meals at this time. Labs and medications reviewed. No further nutrition interventions warranted at this time. RD contact information provided. If additional nutrition issues arise, please re-consult RD.  Kendell Bane RD, LDN, CNSC [phone] Pager [phone] After Hours Pager

## 2012-12-07 NOTE — Progress Notes (Signed)
   Interventional Post-Procedure Note:  SUBJECTIVE: Feeling well this am. No chest pain or dyspnea.   BP 111/66  Pulse 57  Temp(Src) 97.9 F (36.6 C) (Oral)  Resp 18  Ht 5\' 11"  (1.803 m)  Wt 219 lb 9.3 oz (99.6 kg)  BMI 30.64 kg/m2  SpO2 95%  Intake/Output Summary (Last 24 hours) at 12/07/12 0830 Last data filed at 12/06/12 2000  Gross per 24 hour  Intake  457.5 ml  Output      0 ml  Net  457.5 ml    PHYSICAL EXAM General: Well developed, well nourished, in no acute distress. Alert and oriented x 3.  Psych:  Good affect, responds appropriately Neck: No JVD. No masses noted.  Lungs: Clear bilaterally with no wheezes or rhonci noted.  Heart: RRR with no murmurs noted. Abdomen: Bowel sounds are present. Soft, non-tender.  Extremities: No lower extremity edema.   LABS: Basic Metabolic Panel:  Recent Labs  16/10/96 0530 12/07/12 0744  NA 138 141  K 4.0 3.9  CL 103 103  CO2 27 27  GLUCOSE 123* 88  BUN 18 18  CREATININE 1.08 1.20  CALCIUM 9.2 9.3   CBC:  Recent Labs  12/06/12 0522 12/07/12 0744  WBC 13.5* 7.6  HGB 13.9 14.1  HCT 39.7 39.6  MCV 87.1 85.3  PLT 134* 121*   Cardiac Enzymes:  Recent Labs  12/04/12 2139 12/05/12 0147 12/05/12 0710  TROPONINI <0.30 <0.30 <0.30   Current Meds: . acidophilus  1 capsule Oral Daily  . amLODipine  5 mg Oral Daily  . aspirin EC  81 mg Oral Daily  . atorvastatin  20 mg Oral q1800  . bisacodyl  10 mg Rectal Once  . clopidogrel  75 mg Oral Q breakfast  . dicyclomine  20 mg Oral Q6H  . docusate sodium  100 mg Oral BID  . fluticasone  1 spray Each Nare Daily  . irbesartan  75 mg Oral Daily  . multivitamin with minerals  1 tablet Oral Daily  . pantoprazole  40 mg Oral BID  . sodium chloride  3 mL Intravenous Q12H     ASSESSMENT AND PLAN:  1. CAD/Unstable angina: Post PCI follow up note today. Pt being managed by Dr. Algie Coffer. He is doing well post PCI. DES placed in second OM branch yesterday. He will need  ASA and Plavix for one year. Continue statin (he is on simvastatin 20 mg po QHS at home). Consider adding a beta blocker and stopping the Norvasc. LV function is normal by echo. OK for d/c home today from my standpoint. Further planning per Dr. Algie Coffer.   MCALHANY,CHRISTOPHER  3/7/20148:30 AM

## 2012-12-07 NOTE — Discharge Summary (Signed)
Physician Discharge Summary  Russell Padilla ZOX:[id number] DOB: [birth year] DOA: 12/04/2012  PCP: Sissy Hoff, MD  Admit date: 12/04/2012 Discharge date: 12/07/2012  Time spent: 35 minutes  Recommendations for Outpatient Follow-up:  1. Needs outpatient cardiology followup in 2 weeks 2. Recommend lipid panel in 3 months 3. Further outpatient stratification, blood pressure medication adjustment per primary care physician and cardiology   Discharge Diagnoses:  Principal Problem:   Chest pain at rest Active Problems:   HYPERLIPIDEMIA   HYPERTENSION   GERD   Discharge Condition: Good  Diet recommendation: Heart healthy low-salt  Filed Weights   12/04/12 1910 12/07/12 0050  Weight: 96.163 kg (212 lb) 99.6 kg (219 lb 9.3 oz)    History of present illness:  57 year old Caucasian male admitted 12/05/2012 with central chest pain. Thought to be cardiogenic and cardiac catheterization performed showed multivessel coronary artery disease. Patient was reviewed by cardiology and underwent intervention with PCI with DES and second OM branch 12/06/2012 See below  Hospital Course:  1. Chest pain-cardiac catheterization performed 12/05/2012 showing nondominant right coronary artery with severe disease, and mild coronary disease elsewher-[facility] and it interventional procedure to second OM with drug-eluting stent and recommended Plavix and aspirin at least for a year with close followup as an outpatient.  Adjust medications to amlodipine 2.5 mg from 5 mg, added metoprolol XL 25 mg daily, added Plavix 75 mg daily continue aspirin 81 mg, added nitroglycerin when necessary sublingual daily 2. Abdominal pain-unclear etiology. Will need to be seen in the future by Dr. Stark-currently stable  3. Chronic TCP-Seem stable. Unclear if #2 and #3 are related. Peripheral smear showed large platelets for the decrease in amount of platelets-patient should benefit as an outpatient from oncological  perspective for the same if this persists 4. Multiple back surgeries-currently stable  5. History hypertension-currently stable. Patient had medications adjusted as per above 6. 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 Patient was on an ARB and the dose was cut back on this admission from O,mesartan 40-20 mg   Consultants:  Cardiology  Procedures:  Echo 3.5.14=Ef 60-65%, grade 1 diastolic dysfunction-mild LV hypertrophy   Cardiac catheterization 12/05/2012=  IMPRESSION OF HEART CATHETERIZATION:  7. Mild left main coronary artery disease. 8. Moderate left anterior descending artery and its branches. 9. Mild left circumflex artery and moderate to severe disease of OM 2 branches. 10. Non-dominant right coronary artery with severe disease. 11. Mild left ventricular systolic dysfunction. LVEDP 15 mmHg. Ejection fraction 50-60 %.  Cardiac cath with stent placement 12/07/2012 Impression:  1. Unstable angina with severe stenosis second obtuse marginal  2. Successful placement of a DES in the second obtuse marginal branch  3. The RCA is a small non-dominant vessel. I have reviewed the films with my interventional colleagues and we will recommend medical management only for the small non-dominant RCA.     Antibiotics:  None  Discharge Exam: Filed Vitals:   12/07/12 0055 12/07/12 0515 12/07/12 0800 12/07/12 1150  BP: 126/73 116/63 111/66 131/80  Pulse: 50 51 57 53  Temp:  98.3 F (36.8 C) 97.9 F (36.6 C) 98.2 F (36.8 C)  TempSrc:  Oral Oral Oral  Resp:  17 18 18   Height:      Weight:      SpO2:  100% 95% 99%    Doing well no specific chest pain seems to be a good mood  General: Alert oriented no acute distress Cardiovascular: S1-S2 no murmur rub or gallop Respiratory:  Clinically clear  Discharge Instructions  Discharge Orders   Future Orders Complete By Expires     Amb Referral to Cardiac Rehabilitation  As directed     Comments:      Pt agrees to  Outpt. CRP either in [place] or [place], he will decide.    Call MD for:  difficulty breathing, headache or visual disturbances  As directed     Call MD for:  hives  As directed     Call MD for:  redness, tenderness, or signs of infection (pain, swelling, redness, odor or green/yellow discharge around incision site)  As directed     Call MD for:  severe uncontrolled pain  As directed     Call MD for:  temperature >100.4  As directed     Diet - low sodium heart healthy  As directed     Increase activity slowly  As directed         Medication List    TAKE these medications       acidophilus Caps  Take 1 capsule by mouth daily.     amLODipine 5 MG tablet  Commonly known as:  NORVASC  Take 0.5 tablets (2.5 mg total) by mouth daily.     aspirin EC 81 MG tablet  Take 81 mg by mouth daily.     clopidogrel 75 MG tablet  Commonly known as:  PLAVIX  Take 1 tablet (75 mg total) by mouth daily with breakfast.     cyclobenzaprine 10 MG tablet  Commonly known as:  FLEXERIL  Take 10 mg by mouth 3 (three) times daily as needed for muscle spasms.     dicyclomine 20 MG tablet  Commonly known as:  BENTYL  Take 20 mg by mouth every 6 (six) hours.     docusate sodium 100 MG capsule  Commonly known as:  COLACE  Take 100 mg by mouth at bedtime.     hydrocortisone 2.5 % rectal cream  Commonly known as:  ANUSOL-HC  Place 1 application rectally as needed for hemorrhoids.     metoprolol succinate 25 MG 24 hr tablet  Commonly known as:  TOPROL-XL  Take 1 tablet (25 mg total) by mouth daily.     mometasone 50 MCG/ACT nasal spray  Commonly known as:  NASONEX  Place 2 sprays into the nose at bedtime as needed (allergies).     multivitamin with minerals Tabs  Take 1 tablet by mouth daily.     nitroGLYCERIN 0.4 MG SL tablet  Commonly known as:  NITROSTAT  Place 1 tablet (0.4 mg total) under the tongue every 5 (five) minutes as needed for chest pain.     olmesartan 40 MG tablet  Commonly  known as:  BENICAR  Take 0.5 tablets (20 mg total) by mouth every morning.     oxyCODONE-acetaminophen 5-325 MG per tablet  Commonly known as:  PERCOCET/ROXICET  Take 1 tablet by mouth every 4 (four) hours as needed for pain.     pantoprazole 40 MG tablet  Commonly known as:  PROTONIX  Take 40 mg by mouth 2 (two) times daily.     senna 8.6 MG Tabs  Commonly known as:  SENOKOT  Take 1 tablet by mouth at bedtime.     simvastatin 40 MG tablet  Commonly known as:  ZOCOR  Take 1 tablet (40 mg total) by mouth every evening.           Follow-up Information   Follow up with MCALHANY,CHRISTOPHER,  MD. Schedule an appointment as soon as possible for a visit in 2 weeks.   Contact information:   [address] [place] [address] [phone]       Follow up with Sissy Hoff, MD.   Contact information:   [address] [place] [address] [phone]        The results of significant diagnostics from this hospitalization (including imaging, microbiology, ancillary and laboratory) are listed below for reference.    Significant Diagnostic Studies: Dg Chest 2 View  12/04/2012  *RADIOLOGY REPORT*  Clinical Data: 57 year old male chest pain shortness of breath.  CHEST - 2 VIEW  Comparison: 09/22/2006.  Findings: Lower cervical spine fusion hardware may be new and is partially visible.  Stable lung volumes.  Mild eventration of the diaphragm.  Cardiac size and mediastinal contours are within normal limits.  Visualized tracheal air column is within normal limits. No pneumothorax, pulmonary edema, pleural effusion or acute pulmonary opacity.  Stable small left midlung calcified granuloma. Right upper quadrant surgical clips re-identified. No acute osseous abnormality identified.  IMPRESSION: No acute cardiopulmonary abnormality.   Original Report Authenticated By: Erskine Speed, M.D.     Microbiology: No results found for this or any previous visit (from the past 240  hour(s)).   Labs: Basic Metabolic Panel:  Recent Labs Lab 12/04/12 1917 12/04/12 2144 12/05/12 1735 12/06/12 0530 12/07/12 0744  NA 142 141 139 138 141  K 4.2 4.9 4.3 4.0 3.9  CL 106 106 103 103 103  CO2 27  --  24 27 27   GLUCOSE 117* 88 153* 123* 88  BUN 16 16 17 18 18   CREATININE 1.32 1.30 1.12 1.08 1.20  CALCIUM 9.4  --  9.8 9.2 9.3   Liver Function Tests:  Recent Labs Lab 12/04/12 1917  AST 18  ALT 18  ALKPHOS 79  BILITOT 0.3  PROT 6.9  ALBUMIN 3.8   No results found for this basename: LIPASE, AMYLASE,  in the last 168 hours No results found for this basename: AMMONIA,  in the last 168 hours CBC:  Recent Labs Lab 12/04/12 1917 12/04/12 2144 12/05/12 0710 12/06/12 0522 12/07/12 0744  WBC 7.0  --  6.4 13.5* 7.6  HGB 15.7 15.6 14.5 13.9 14.1  HCT 44.6 46.0 41.1 39.7 39.6  MCV 87.8  --  85.3 87.1 85.3  PLT 128*  --  122* 134* 121*   Cardiac Enzymes:  Recent Labs Lab 12/04/12 2139 12/05/12 0147 12/05/12 0710  TROPONINI <0.30 <0.30 <0.30   BNP: BNP (last 3 results) No results found for this basename: PROBNP,  in the last 8760 hours CBG: No results found for this basename: GLUCAP,  in the last 168 hours     Signed:  Rhetta Mura  [facility] 12/07/2012, 12:17 PM

## 2012-12-07 NOTE — Progress Notes (Signed)
CARDIAC REHAB PHASE I   PRE:  Rate/Rhythm: 60 SR  BP:  Supine: 111/66  Sitting:   Standing:    SaO2:   MODE:  Ambulation: 1000 ft   POST:  Rate/Rhythem: 66 SR  BP:  Supine:   Sitting: 130/85  Standing:    SaO2:  0810-0930 Pt tolerated ambulation well without c/o of cp or SOB. VS stable. Completed discharge education with pt and wife. They voice understanding. Discussed smoking cessation with pt. Gave him tips for quitting and number for coaching contact number. Pt agrees to Outpt. CRP in [place] or , he will decide which one.  Beatrix Fetters RN

## 2012-12-10 MED FILL — Dextrose Inj 5%: INTRAVENOUS | Qty: 50 | Status: AC

## 2012-12-11 ENCOUNTER — Telehealth (HOSPITAL_COMMUNITY): Payer: Self-pay | Admitting: Dietician

## 2012-12-11 NOTE — Telephone Encounter (Signed)
Received referral via [facility] inbox from Kendell Bane, RD at [facility]. Pt and wife are interested in more education (Heart Healthy diet). Noted pt recently discharged on 12/07/12.

## 2012-12-11 NOTE — Telephone Encounter (Signed)
Called pt at 1522. Appointment scheduled for 12/20/12 at 1330.

## 2012-12-13 ENCOUNTER — Telehealth: Payer: Self-pay | Admitting: Nurse Practitioner

## 2012-12-13 NOTE — Telephone Encounter (Signed)
Patient walked in with c/o nosebleed x 2 days, better today.  Patient states left nostril began bleeding profusely on 12/12/12 without any previous problems.  States has had right nostril bleeding in the past, but never left sided.  Patient has been taking ASA and Plavix for 6 days.  Patient has hx of Nasonex use but has not used in the past 2 days. Consulted with Dr. Eden Emms, DOD who states patient cannot stop or decrease anti-coagulant use and needs to f/u with PCP or ENT.  Patient verified understanding to call PCP to determine if they have equipment for nasal exam and if not to ask for referral to ENT.  Patient ambulated from office with his wife without difficulty or incident.

## 2012-12-20 ENCOUNTER — Encounter (HOSPITAL_COMMUNITY): Payer: Self-pay | Admitting: Dietician

## 2012-12-20 NOTE — Progress Notes (Signed)
Outpatient Initial Nutrition Assessment  Date:12/20/2012   Appt Start Time: 1334  Referring Physician: [facility] (RD Kendell Bane) Reason for Visit: heart healthy diet education  Nutrition Assessment:  Height: 5\' 11"  (180.3 cm)   Weight: 207 lb (93.895 kg)   IBW: 172# %IBW: 120% UBW: 212# %UBW: 102% Body mass index is 28.88 kg/(m^2). Classified as overweight.  Goal Weight: 187# Weight hx: Pt reports maintain a weight of 206-207# for most of his adult life. His highest weight was 215# about 32 years ago. He reports his lowest weight was 195# and that he "looks like a cancer patient" if we weighed under that.   Estimated nutritional needs: 1700-1800 kcals daily, 75-94 grams protein daily, 1.7-1.8 L fluid daily  PMH:  Past Medical History  Diagnosis Date  . Renal disorder     renal calculi  . Stomach pain     chronic    Medications:  Current Outpatient Rx  Name  Route  Sig  Dispense  Refill  . acidophilus (RISAQUAD) CAPS   Oral   Take 1 capsule by mouth daily.         Marland Kitchen amLODipine (NORVASC) 5 MG tablet   Oral   Take 0.5 tablets (2.5 mg total) by mouth daily.   30 tablet   1   . aspirin EC 81 MG tablet   Oral   Take 81 mg by mouth daily.         . clopidogrel (PLAVIX) 75 MG tablet   Oral   Take 1 tablet (75 mg total) by mouth daily with breakfast.   30 tablet   12   . cyclobenzaprine (FLEXERIL) 10 MG tablet   Oral   Take 10 mg by mouth 3 (three) times daily as needed for muscle spasms.         Marland Kitchen dicyclomine (BENTYL) 20 MG tablet   Oral   Take 20 mg by mouth every 6 (six) hours.         . docusate sodium (COLACE) 100 MG capsule   Oral   Take 100 mg by mouth at bedtime.         . hydrocortisone (ANUSOL-HC) 2.5 % rectal cream   Rectal   Place 1 application rectally as needed for hemorrhoids.         . metoprolol succinate (TOPROL-XL) 25 MG 24 hr tablet   Oral   Take 1 tablet (25 mg total) by mouth daily.   30 tablet   2   . mometasone (NASONEX) 50  MCG/ACT nasal spray   Nasal   Place 2 sprays into the nose at bedtime as needed (allergies).         . Multiple Vitamin (MULTIVITAMIN WITH MINERALS) TABS   Oral   Take 1 tablet by mouth daily.         . nitroGLYCERIN (NITROSTAT) 0.4 MG SL tablet   Sublingual   Place 1 tablet (0.4 mg total) under the tongue every 5 (five) minutes as needed for chest pain.   20 tablet   1   . olmesartan (BENICAR) 40 MG tablet   Oral   Take 0.5 tablets (20 mg total) by mouth every morning.   30 tablet   1   . oxyCODONE-acetaminophen (PERCOCET/ROXICET) 5-325 MG per tablet   Oral   Take 1 tablet by mouth every 4 (four) hours as needed for pain.         . pantoprazole (PROTONIX) 40 MG tablet   Oral   Take  40 mg by mouth 2 (two) times daily.          Marland Kitchen senna (SENOKOT) 8.6 MG TABS   Oral   Take 1 tablet by mouth at bedtime.         . simvastatin (ZOCOR) 40 MG tablet   Oral   Take 1 tablet (40 mg total) by mouth every evening.   30 tablet   2     Labs: CMP     Component Value Date/Time   NA 141 12/07/2012 0744   K 3.9 12/07/2012 0744   CL 103 12/07/2012 0744   CO2 27 12/07/2012 0744   GLUCOSE 88 12/07/2012 0744   BUN 18 12/07/2012 0744   CREATININE 1.20 12/07/2012 0744   CALCIUM 9.3 12/07/2012 0744   PROT 6.9 12/04/2012 1917   ALBUMIN 3.8 12/04/2012 1917   AST 18 12/04/2012 1917   ALT 18 12/04/2012 1917   ALKPHOS 79 12/04/2012 1917   BILITOT 0.3 12/04/2012 1917   GFRNONAA 65* 12/07/2012 0744   GFRAA 76* 12/07/2012 0744    Lipid Panel  No results found for this basename: chol, trig, hdl, cholhdl, vldl, ldlcalc     No results found for this basename: HGBA1C   Lab Results  Component Value Date   CREATININE 1.20 12/07/2012     Lifestyle/ social habits: Mr. Parekh is a very pleasant gentleman who lives in [place] with his wife, who is present with him today. They have no children. 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 He retired 6 years ago. He reports that he quit  smoking about 2 weeks ago, when he was discharged from the hospital. He reports he has not participate in structured exercise since 2005, due to 4 back fusions. Prior to retirement, he was very active during the day. He reports he has a treadmill in his home and would like to purchase an elliptical. He has very strong support system in his wife and older brother. He is interested in participating in cardiac rehab at [facility]; he begins orientation on 01/10/13. He feels very well; he reports he feels better since d/c and that he has gotten good reports from his multiple MD appointments the past few weeks.   Nutrition hx/habits: Mr. Kleinpeter reports very drastic changes to his eating habits proceeding discharge from the hospital (s/p stent). Prior to his hospital stay, he was eating out 5 nights per week and was consuming a high calorie, high fat diet, including bacon and sausage every morning. He has been trying to eat more at home, but reports his biggest obstacle is choosing heart healthy options at restaurants. He expresses fear in eating "bad foods", particularly seasoned beans, canned vegetables, and desserts. He drinks mainly diet sodas and low calorie drink mixes. He has been using Stevia to sweeten his foods and beverages.   Diet recall: Breakfast: 2 egg beaters, 2 pieces whole wheat toast, smart balance butter, strawberry preserves; Lunch: grilled chicken sandwich with 1/2 whole wheat bun, lettuce, tomatoes (from [employer]); Dinner: 2 pork chops stir fired in [employer] with green peas, broccoli, snow peas, red and green bell peppers, onions  Nutrition Diagnosis: Nutrition-related knowledge deficit r/t new dx of heart disease AEB pt with many diet related questions.   Nutrition Intervention: Nutrition rx: 1800 kcal heart healthy diet; 3 meals per day; physical activity as tolerated; low calorie beverages only  Education/Counseling Provided: Educated pt on principles of heart healthy diet. Discussed  importance of limiting  fat, cholesterol, and sodium in diet. Discussed nutritional content of commonly eaten foods and suggested healthier alternatives. Educated pt on plate method and a general, healthful diet that includes low fat dairy, lean meats, whole fruits and vegetables, and whole grains most often. Discussed importance of a healthy diet along with regular physical activity as tolerated to promote a healthy lifestyle. Encouraged slow, moderate weight loss (0.5-2# weight loss per week) and adopting healthy lifestyle changes vs. obtaining a certain body type or weight. Most of visit was spent counseling on further developing a heart healthy lifestyle. Discussed ways for pt to choose healthier options while eating out and discussed menu clues that would indicate high fat, higher sodium items. Also discussed different options for artificial sweeteners and low calorie beverage choices. Praised pt for lifestyle changes currently made. Used TeachBack to assess understanding.   Understanding, Motivation, Ability to Follow Recommendations: Expect very good compliance. Pt is very motivated.   Monitoring and Evaluation: Goals: 1) Weight maintenance or 1-2# wt loss per week; 2) Physical activity as tolerated  Recommendations: 1) Choose low calorie beverages and sweeten foods and beverages with low calorie sweetener of choice; 2) If choosing canned vegetables, rinse, drain, and put in fresh water to prepare; 3) When eating out, choose healthy options for sides (ex. Side salad, vegetables) and limit fried proteins; 4) Order condiments and dressings on the side; 5) Attend cardiac rehab  F/U: PRN. Provided RD contact information. Education will be reinforced in cardiac rehab program.   Melody Haver, RD, LDN 12/20/2012  Appt EndTime: 1530

## 2012-12-24 ENCOUNTER — Ambulatory Visit (INDEPENDENT_AMBULATORY_CARE_PROVIDER_SITE_OTHER): Payer: 59 | Admitting: Physician Assistant

## 2012-12-24 ENCOUNTER — Encounter: Payer: Self-pay | Admitting: Physician Assistant

## 2012-12-24 VITALS — BP 98/58 | HR 56 | Ht 71.0 in | Wt 217.4 lb

## 2012-12-24 DIAGNOSIS — Z720 Tobacco use: Secondary | ICD-10-CM

## 2012-12-24 DIAGNOSIS — F17200 Nicotine dependence, unspecified, uncomplicated: Secondary | ICD-10-CM

## 2012-12-24 DIAGNOSIS — E785 Hyperlipidemia, unspecified: Secondary | ICD-10-CM

## 2012-12-24 DIAGNOSIS — I2510 Atherosclerotic heart disease of native coronary artery without angina pectoris: Secondary | ICD-10-CM

## 2012-12-24 DIAGNOSIS — I10 Essential (primary) hypertension: Secondary | ICD-10-CM

## 2012-12-24 NOTE — Patient Instructions (Addendum)
STOP TAKING NORVASC PER SCOTT WEAVER, PAC  PLEASE FOLLOW UP WITH DR. Clifton James ON 02/11/13 @ 4:30

## 2012-12-24 NOTE — Progress Notes (Signed)
[address], [address] [place], [place]  [address] Phone: [phone] Fax:  [phone]  Date:  12/24/2012   ID:  Russell Padilla, No October 14, 1955, MRN [id number]  PCP:  Sissy Hoff, MD  Primary Cardiologist:  Dr. Verne Carrow     History of Present Illness: Russell Padilla is a 57 y.o. male who returns for f/u after a recent admission to the hospital 3/4-3/7 with unstable angina.  He has a hx of chronic epigastric pain, HTN, HL, chronic upper neck and shoulder pain, s/p cervical fusion.  Patient was initially seen by Dr. Algie Coffer in the hospital after presenting with CP.  MI was ruled out.  LHC 12/05/12: Ostial LAD 20%, proximal LAD 50%, ostial OM1 30%, superior branch of OM2 ostial to proximal 90%, smaller inferior branch proximal 70-80%, non-dominant RCA with diffuse moderate to severe disease throughout the proximal two thirds, EF 55-60%.  Dr. Verne Carrow was asked to perform his PCI: Promus Premier DES to the superior branch of the OM2. Medical therapy recommended for the RCA which is a small nondominant vessel. Echocardiogram 12/04/12: Mild LVH, EF 60-65%, grade 1 diastolic dysfunction.  He is doing well since d/c.  The patient denies chest pain, shortness of breath, syncope, orthopnea, PND or significant pedal edema. He did get lightheaded yesterday and has noted this with standing up at times.    Labs (3/14):  K 3.9, Cr 1.20, ALT 18, Hgb 14.1, Plt 121K, TSH 1.492  Wt Readings from Last 3 Encounters:  12/24/12 217 lb 6.4 oz (98.612 kg)  12/20/12 207 lb (93.895 kg)  12/07/12 219 lb 9.3 oz (99.6 kg)     Past Medical History  Diagnosis Date  . Renal disorder     renal calculi  . Stomach pain     chronic  . CAD (coronary artery disease)     a. LHC 12/05/12: Ostial LAD 20%, proximal LAD 50%, ostial OM1 30%, superior branch of OM2 ostial to proximal 90%, smaller inferior branch proximal 70-80%, non-dominant RCA with diffuse moderate to severe disease throughout  the proximal two thirds, EF 55-60%. => b. PCI: Promus Premier DES to sup br of OM2  . HLD (hyperlipidemia)   . HTN (hypertension)     Current Outpatient Prescriptions  Medication Sig Dispense Refill  . acidophilus (RISAQUAD) CAPS Take 1 capsule by mouth daily.      Marland Kitchen amLODipine (NORVASC) 5 MG tablet Take 0.5 tablets (2.5 mg total) by mouth daily.  30 tablet  1  . aspirin EC 81 MG tablet Take 81 mg by mouth daily.      . clopidogrel (PLAVIX) 75 MG tablet Take 1 tablet (75 mg total) by mouth daily with breakfast.  30 tablet  12  . cyclobenzaprine (FLEXERIL) 10 MG tablet Take 10 mg by mouth 3 (three) times daily as needed for muscle spasms.      Marland Kitchen dicyclomine (BENTYL) 20 MG tablet Take 20 mg by mouth every 6 (six) hours.      . docusate sodium (COLACE) 100 MG capsule Take 100 mg by mouth at bedtime.      . hydrocortisone (ANUSOL-HC) 2.5 % rectal cream Place 1 application rectally as needed for hemorrhoids.      . metoprolol succinate (TOPROL-XL) 25 MG 24 hr tablet Take 1 tablet (25 mg total) by mouth daily.  30 tablet  2  . mometasone (NASONEX) 50 MCG/ACT nasal spray Place 2 sprays into the nose at bedtime as needed (allergies).      Marland Kitchen  Multiple Vitamin (MULTIVITAMIN WITH MINERALS) TABS Take 1 tablet by mouth daily.      . nitroGLYCERIN (NITROSTAT) 0.4 MG SL tablet Place 1 tablet (0.4 mg total) under the tongue every 5 (five) minutes as needed for chest pain.  20 tablet  1  . olmesartan (BENICAR) 40 MG tablet Take 0.5 tablets (20 mg total) by mouth every morning.  30 tablet  1  . oxyCODONE-acetaminophen (PERCOCET/ROXICET) 5-325 MG per tablet Take 1 tablet by mouth every 4 (four) hours as needed for pain.      . pantoprazole (PROTONIX) 40 MG tablet Take 40 mg by mouth 2 (two) times daily.       Marland Kitchen senna (SENOKOT) 8.6 MG TABS Take 1 tablet by mouth at bedtime.      . simvastatin (ZOCOR) 40 MG tablet Take 1 tablet (40 mg total) by mouth every evening.  30 tablet  2   No current facility-administered  medications for this visit.    Allergies:    Allergies  Allergen Reactions  . Latex Hives and Rash    Social History:  The patient  reports that he has been smoking Cigarettes.  He has been smoking about 0.50 packs per day. He does not have any smokeless tobacco history on file. He reports that he does not drink alcohol or use illicit drugs.   ROS:  Please see the history of present illness.   He had significant epistaxis post d/c and was referred to ENT.  He was placed on saline spray and saline gel with improvement.   All other systems reviewed and negative.   PHYSICAL EXAM: VS:  BP 98/58  Pulse 56  Ht 5\' 11"  (1.803 m)  Wt 217 lb 6.4 oz (98.612 kg)  BMI 30.33 kg/m2 Well nourished, well developed, in no acute distress HEENT: normal Neck: no JVD Cardiac:  normal S1, S2; RRR; no murmur Lungs:  clear to auscultation bilaterally, no wheezing, rhonchi or rales Abd: soft, nontender, no hepatomegaly Ext: no edema Skin: warm and dry Neuro:  CNs 2-12 intact, no focal abnormalities noted  EKG:  Sinus brady, HR 56, normal axis     ASSESSMENT AND PLAN:  1. Coronary Artery Disease:  Doing well post PCI to his OM2.  Med Rx for diffusely disease small non-dominant RCA.  EF is preserved.  He thinks he will pursue cardiac rehab.  We discussed the importance of dual antiplatelet therapy.  2. Hypertension:  BP low.  I think he is symptomatic.  Stop Norvasc.  Continue other medications. 3. Hyperlipidemia:  Zocor adjusted in the hospital.  Also placed on Norvasc.  Max dose of Zocor would be 20 mg with Norvasc.  But, Norvasc will be d/c'd as noted.  Lipids managed by PCP.  4. Tobacco Abuse:  He has not smoke since prior to his hospitalization. 5. Disposition:  Follow up with Dr. Verne Carrow in 6 weeks.   Signed, Tereso Newcomer, PA-C  10:15 AM 12/24/2012

## 2013-01-07 ENCOUNTER — Encounter: Payer: Self-pay | Admitting: Cardiovascular Disease

## 2013-01-10 ENCOUNTER — Encounter (HOSPITAL_COMMUNITY)
Admission: RE | Admit: 2013-01-10 | Discharge: 2013-01-10 | Disposition: A | Discharge status: Home / Self Care | Payer: 59 | Source: Ambulatory Visit | Attending: Cardiovascular Disease | Admitting: Cardiovascular Disease

## 2013-01-10 ENCOUNTER — Encounter (HOSPITAL_COMMUNITY): Payer: 59

## 2013-01-10 ENCOUNTER — Encounter (HOSPITAL_COMMUNITY): Payer: Self-pay

## 2013-01-10 VITALS — BP 130/80 | HR 49 | Ht 71.0 in | Wt 214.2 lb

## 2013-01-10 DIAGNOSIS — I252 Old myocardial infarction: Secondary | ICD-10-CM | POA: Insufficient documentation

## 2013-01-10 DIAGNOSIS — Z9862 Peripheral vascular angioplasty status: Secondary | ICD-10-CM

## 2013-01-10 DIAGNOSIS — Z5189 Encounter for other specified aftercare: Secondary | ICD-10-CM | POA: Insufficient documentation

## 2013-01-10 DIAGNOSIS — Z9861 Coronary angioplasty status: Secondary | ICD-10-CM | POA: Insufficient documentation

## 2013-01-10 DIAGNOSIS — Z955 Presence of coronary angioplasty implant and graft: Secondary | ICD-10-CM

## 2013-01-10 NOTE — Progress Notes (Signed)
Patient is referred to Cardiac Rehab by Dr. Algie Coffer due to Stent placement v45.82 and Balloon Angioplasty. During orientation advised patient on arrival and appointment times what to wear, what to do before, during and after exercise. Reviewed attendance and class policy. Talked about inclement weather and class consultation policy. Pt is scheduled to start Cardiac Rehab on 01/21/13 at 11 am. Pt was advised to come to class 5 minutes before class starts. He was also given instructions on meeting with the dietician and attending the Family Structure classes. Pt is eager to get started. Patient was able to complete 6 minute pre walk test.

## 2013-01-10 NOTE — Patient Instructions (Signed)
Pt has finished orientation and is scheduled to start CR on 01/21/13 at 11 am. Pt has been instructed to arrive to class 15 minutes early for scheduled class. Pt has been instructed to wear comfortable clothing and shoes with rubber soles. Pt has been told to take their medications 1 hour prior to coming to class.  If the patient is not going to attend class, he/she has been instructed to call.

## 2013-01-16 ENCOUNTER — Encounter (HOSPITAL_COMMUNITY): Payer: Self-pay | Admitting: Family Medicine

## 2013-01-16 ENCOUNTER — Observation Stay (HOSPITAL_COMMUNITY)
Admission: EM | Admit: 2013-01-16 | Discharge: 2013-01-17 | Disposition: A | Discharge status: Home / Self Care | Payer: 59 | Attending: Internal Medicine | Admitting: Internal Medicine

## 2013-01-16 ENCOUNTER — Emergency Department (HOSPITAL_COMMUNITY): Payer: 59

## 2013-01-16 ENCOUNTER — Telehealth: Payer: Self-pay | Admitting: Cardiovascular Disease

## 2013-01-16 DIAGNOSIS — Z86010 Personal history of colonic polyps: Secondary | ICD-10-CM

## 2013-01-16 DIAGNOSIS — R1319 Other dysphagia: Secondary | ICD-10-CM

## 2013-01-16 DIAGNOSIS — R5383 Other fatigue: Secondary | ICD-10-CM | POA: Insufficient documentation

## 2013-01-16 DIAGNOSIS — J328 Other chronic sinusitis: Secondary | ICD-10-CM

## 2013-01-16 DIAGNOSIS — R001 Bradycardia, unspecified: Secondary | ICD-10-CM | POA: Diagnosis present

## 2013-01-16 DIAGNOSIS — R1013 Epigastric pain: Secondary | ICD-10-CM

## 2013-01-16 DIAGNOSIS — R0600 Dyspnea, unspecified: Secondary | ICD-10-CM

## 2013-01-16 DIAGNOSIS — E785 Hyperlipidemia, unspecified: Secondary | ICD-10-CM

## 2013-01-16 DIAGNOSIS — R143 Flatulence: Secondary | ICD-10-CM

## 2013-01-16 DIAGNOSIS — N200 Calculus of kidney: Secondary | ICD-10-CM

## 2013-01-16 DIAGNOSIS — I200 Unstable angina: Secondary | ICD-10-CM | POA: Insufficient documentation

## 2013-01-16 DIAGNOSIS — I2510 Atherosclerotic heart disease of native coronary artery without angina pectoris: Secondary | ICD-10-CM | POA: Insufficient documentation

## 2013-01-16 DIAGNOSIS — R5381 Other malaise: Secondary | ICD-10-CM | POA: Insufficient documentation

## 2013-01-16 DIAGNOSIS — R0609 Other forms of dyspnea: Secondary | ICD-10-CM

## 2013-01-16 DIAGNOSIS — I498 Other specified cardiac arrhythmias: Secondary | ICD-10-CM | POA: Insufficient documentation

## 2013-01-16 DIAGNOSIS — K8050 Calculus of bile duct without cholangitis or cholecystitis without obstruction: Secondary | ICD-10-CM

## 2013-01-16 DIAGNOSIS — I10 Essential (primary) hypertension: Secondary | ICD-10-CM | POA: Insufficient documentation

## 2013-01-16 DIAGNOSIS — D696 Thrombocytopenia, unspecified: Secondary | ICD-10-CM | POA: Diagnosis present

## 2013-01-16 DIAGNOSIS — R1011 Right upper quadrant pain: Secondary | ICD-10-CM

## 2013-01-16 DIAGNOSIS — R932 Abnormal findings on diagnostic imaging of liver and biliary tract: Secondary | ICD-10-CM

## 2013-01-16 DIAGNOSIS — K219 Gastro-esophageal reflux disease without esophagitis: Secondary | ICD-10-CM | POA: Diagnosis present

## 2013-01-16 DIAGNOSIS — K649 Unspecified hemorrhoids: Secondary | ICD-10-CM

## 2013-01-16 DIAGNOSIS — R110 Nausea: Secondary | ICD-10-CM

## 2013-01-16 DIAGNOSIS — K5900 Constipation, unspecified: Secondary | ICD-10-CM

## 2013-01-16 DIAGNOSIS — R141 Gas pain: Secondary | ICD-10-CM

## 2013-01-16 DIAGNOSIS — R079 Chest pain, unspecified: Principal | ICD-10-CM | POA: Diagnosis present

## 2013-01-16 LAB — CBC
HCT: 41.8 % (ref 39.0–52.0)
Hemoglobin: 14.4 g/dL (ref 13.0–17.0)
MCH: 30.1 pg (ref 26.0–34.0)
MCHC: 34.4 g/dL (ref 30.0–36.0)
MCHC: 35 g/dL (ref 30.0–36.0)
MCV: 86 fL (ref 78.0–100.0)
Platelets: 123 10*3/uL — ABNORMAL LOW (ref 150–400)
RBC: 4.79 MIL/uL (ref 4.22–5.81)
WBC: 6.2 10*3/uL (ref 4.0–10.5)

## 2013-01-16 LAB — BASIC METABOLIC PANEL
BUN: 12 mg/dL (ref 6–23)
CO2: 31 mEq/L (ref 19–32)
Chloride: 104 mEq/L (ref 96–112)
Glucose, Bld: 91 mg/dL (ref 70–99)
Potassium: 4.3 mEq/L (ref 3.5–5.1)

## 2013-01-16 LAB — TROPONIN I
Troponin I: <0.3 ng/mL (ref <0.30)
Troponin I: <0.3 ng/mL (ref <0.30)

## 2013-01-16 MED ORDER — METOPROLOL SUCCINATE 12.5 MG HALF TABLET
12.5000 mg | ORAL_TABLET | Freq: Every day | ORAL | Status: DC
  Administered 2013-01-16: 12.5 mg via ORAL
  Filled 2013-01-16 (×2): qty 1

## 2013-01-16 MED ORDER — MORPHINE SULFATE 2 MG/ML IJ SOLN: 1.0000 mg | INTRAMUSCULAR | Status: DC | PRN

## 2013-01-16 MED ORDER — HEPARIN SODIUM (PORCINE) 5000 UNIT/ML IJ SOLN
5000.0000 [IU] | Freq: Three times a day (TID) | INTRAMUSCULAR | Status: DC
  Administered 2013-01-16: 5000 [IU] via SUBCUTANEOUS
  Filled 2013-01-16 (×5): qty 1

## 2013-01-16 MED ORDER — SODIUM CHLORIDE 0.9 % IJ SOLN: 3.0000 mL | INTRAMUSCULAR | Status: DC | PRN

## 2013-01-16 MED ORDER — SENNA 8.6 MG PO TABS
2.0000 | ORAL_TABLET | Freq: Every day | ORAL | Status: DC
  Administered 2013-01-16: 17.2 mg via ORAL
  Filled 2013-01-16 (×2): qty 2

## 2013-01-16 MED ORDER — HYDROCORTISONE 2.5 % RE CREA: 1.0000 "application " | TOPICAL_CREAM | RECTAL | Status: DC | PRN

## 2013-01-16 MED ORDER — SODIUM CHLORIDE 0.9 % IV SOLN: 250.0000 mL | INTRAVENOUS | Status: DC | PRN

## 2013-01-16 MED ORDER — ASPIRIN EC 81 MG PO TBEC
81.0000 mg | DELAYED_RELEASE_TABLET | Freq: Every day | ORAL | Status: DC
  Filled 2013-01-16: qty 1

## 2013-01-16 MED ORDER — DOCUSATE SODIUM 100 MG PO CAPS
100.0000 mg | ORAL_CAPSULE | Freq: Two times a day (BID) | ORAL | Status: DC
  Administered 2013-01-16 – 2013-01-17 (×2): 100 mg via ORAL
  Filled 2013-01-16 (×3): qty 1

## 2013-01-16 MED ORDER — FLUTICASONE PROPIONATE 50 MCG/ACT NA SUSP
2.0000 | Freq: Every day | NASAL | Status: DC
  Filled 2013-01-16: qty 16

## 2013-01-16 MED ORDER — CLOPIDOGREL BISULFATE 75 MG PO TABS
75.0000 mg | ORAL_TABLET | Freq: Every day | ORAL | Status: DC
  Administered 2013-01-17: 75 mg via ORAL
  Filled 2013-01-16: qty 1

## 2013-01-16 MED ORDER — ACETAMINOPHEN 650 MG RE SUPP: 650.0000 mg | Freq: Four times a day (QID) | RECTAL | Status: DC | PRN

## 2013-01-16 MED ORDER — ACETAMINOPHEN 325 MG PO TABS
650.0000 mg | ORAL_TABLET | Freq: Once | ORAL | Status: AC
  Administered 2013-01-16: 650 mg via ORAL
  Filled 2013-01-16: qty 2

## 2013-01-16 MED ORDER — NITROGLYCERIN 0.4 MG SL SUBL
0.4000 mg | SUBLINGUAL_TABLET | SUBLINGUAL | Status: DC | PRN
Start: 2013-01-16 — End: 2013-01-17

## 2013-01-16 MED ORDER — CYCLOBENZAPRINE HCL 10 MG PO TABS
10.0000 mg | ORAL_TABLET | Freq: Three times a day (TID) | ORAL | Status: DC | PRN
  Filled 2013-01-16: qty 1

## 2013-01-16 MED ORDER — SIMVASTATIN 40 MG PO TABS: 40.0000 mg | ORAL_TABLET | Freq: Every day | ORAL | Status: DC

## 2013-01-16 MED ORDER — ONDANSETRON HCL 4 MG/2ML IJ SOLN: 4.0000 mg | Freq: Four times a day (QID) | INTRAMUSCULAR | Status: DC | PRN

## 2013-01-16 MED ORDER — SIMVASTATIN 40 MG PO TABS
40.0000 mg | ORAL_TABLET | Freq: Every day | ORAL | Status: DC
  Administered 2013-01-16: 40 mg via ORAL
  Filled 2013-01-16 (×2): qty 1

## 2013-01-16 MED ORDER — ONDANSETRON HCL 4 MG PO TABS: 4.0000 mg | ORAL_TABLET | Freq: Four times a day (QID) | ORAL | Status: DC | PRN

## 2013-01-16 MED ORDER — OXYCODONE HCL 5 MG PO TABS
5.0000 mg | ORAL_TABLET | ORAL | Status: DC | PRN
  Administered 2013-01-16: 5 mg via ORAL
  Filled 2013-01-16: qty 1

## 2013-01-16 MED ORDER — PANTOPRAZOLE SODIUM 40 MG PO TBEC
40.0000 mg | DELAYED_RELEASE_TABLET | Freq: Two times a day (BID) | ORAL | Status: DC
  Administered 2013-01-16 – 2013-01-17 (×2): 40 mg via ORAL
  Filled 2013-01-16 (×2): qty 1

## 2013-01-16 MED ORDER — DICYCLOMINE HCL 20 MG PO TABS
20.0000 mg | ORAL_TABLET | Freq: Two times a day (BID) | ORAL | Status: DC
  Administered 2013-01-16 – 2013-01-17 (×2): 20 mg via ORAL
  Filled 2013-01-16 (×3): qty 1

## 2013-01-16 MED ORDER — ACETAMINOPHEN 325 MG PO TABS: 650.0000 mg | ORAL_TABLET | Freq: Four times a day (QID) | ORAL | Status: DC | PRN

## 2013-01-16 MED ORDER — SODIUM CHLORIDE 0.9 % IJ SOLN: 3.0000 mL | Freq: Two times a day (BID) | INTRAMUSCULAR | Status: DC

## 2013-01-16 MED ORDER — SUCRALFATE 1 GM/10ML PO SUSP
1.0000 g | Freq: Three times a day (TID) | ORAL | Status: DC
  Administered 2013-01-16 – 2013-01-17 (×2): 1 g via ORAL
  Filled 2013-01-16 (×8): qty 10

## 2013-01-16 MED ORDER — ADULT MULTIVITAMIN W/MINERALS CH
1.0000 | ORAL_TABLET | Freq: Every day | ORAL | Status: DC
  Administered 2013-01-17: 1 via ORAL
  Filled 2013-01-16: qty 1

## 2013-01-16 NOTE — ED Notes (Signed)
Per pt sts intermittent chest pain that started yesterday. sts hurts worse when he lays on his side. sts SOB. Denies nitro at home. sts ASA this am. denies dizziness N,V. sts some fatigue

## 2013-01-16 NOTE — Telephone Encounter (Signed)
New problem   Pt had cath sx for blockage 12/06/12 and is having chest pain w/fluttering, fatigue and sob. Pt pain right now is about a 3 now. But have this eposides a lot. Please call pt.

## 2013-01-16 NOTE — ED Provider Notes (Signed)
History     CSN: [id number]  Arrival date & time 01/16/13  1503   First MD Initiated Contact with Patient 01/16/13 1528      Chief Complaint  Patient presents with  . Chest Pain    (Consider location/radiation/quality/duration/timing/severity/associated sxs/prior treatment) Patient is a 57 y.o. male presenting with chest pain. The history is provided by the patient.  Chest Pain He is status post placement of a cardiac stent in March 6. Since being discharged from the hospital, he has had fairly constant dyspnea which he feels is related to medications. While in the hospital, he had been started on metoprolol, amlodipine, and clopidogrel. Amlodipine has been discontinued because of orthostatic hypotension. Dyspnea is not related to exertion and has not been getting better nor is he getting worse. Nothing seems to make it better or worse. He denies cough, fever, chills, sweats. He has also been having pain in the left upper back of her last week. His pain is constant and worse when he bends over. He rates that pain at 6/10. He has been having intermittent episodes of dull, achy pain in the left anterior chest which is not related to exertion or body position. They last for varying lengths of time. Chest pain is not affected by exertion, deep breathing, or body position. He has not tried to take anything to treat this pain. He called his cardiologist's office today concerned mostly about his dyspnea but was told to come to the ED because of his chest pain.  Past Medical History  Diagnosis Date  . Renal disorder     renal calculi  . Stomach pain     chronic  . CAD (coronary artery disease)     a. LHC 12/05/12: Ostial LAD 20%, proximal LAD 50%, ostial OM1 30%, superior branch of OM2 ostial to proximal 90%, smaller inferior branch proximal 70-80%, non-dominant RCA with diffuse moderate to severe disease throughout the proximal two thirds, EF 55-60%. => b. PCI: Promus Premier DES to sup br of OM2   . HLD (hyperlipidemia)   . HTN (hypertension)   . Myocardial infarction 12/04/2012    Past Surgical History  Procedure Laterality Date  . Cholecystectomy    . Back surgery      2 cervical/ 2 lumbar fusions  . Coronary stent placement      Family History  Problem Relation Age of Onset  . Heart disease Brother     History  Substance Use Topics  . Smoking status: Former Smoker -- 0.50 packs/day    Types: Cigarettes    Start date: 12/24/1976    Quit date: 12/04/2012  . Smokeless tobacco: Not on file  . Alcohol Use: No      Review of Systems  Cardiovascular: Positive for chest pain.  All other systems reviewed and are negative.    Allergies  Latex  Home Medications   Current Outpatient Rx  Name  Route  Sig  Dispense  Refill  . aspirin EC 81 MG tablet   Oral   Take 81 mg by mouth daily.         . clopidogrel (PLAVIX) 75 MG tablet   Oral   Take 1 tablet (75 mg total) by mouth daily with breakfast.   30 tablet   12   . cyclobenzaprine (FLEXERIL) 10 MG tablet   Oral   Take 10 mg by mouth 3 (three) times daily as needed for muscle spasms.         Marland Kitchen dicyclomine (BENTYL)  20 MG tablet   Oral   Take 20 mg by mouth every 6 (six) hours.         . docusate sodium (COLACE) 100 MG capsule   Oral   Take 100 mg by mouth at bedtime.         . hydrocortisone (ANUSOL-HC) 2.5 % rectal cream   Rectal   Place 1 application rectally as needed for hemorrhoids.         . metoprolol succinate (TOPROL-XL) 25 MG 24 hr tablet   Oral   Take 1 tablet (25 mg total) by mouth daily.   30 tablet   2   . mometasone (NASONEX) 50 MCG/ACT nasal spray   Nasal   Place 2 sprays into the nose at bedtime as needed (allergies).         . Multiple Vitamin (MULTIVITAMIN WITH MINERALS) TABS   Oral   Take 1 tablet by mouth daily.         . nitroGLYCERIN (NITROSTAT) 0.4 MG SL tablet   Sublingual   Place 1 tablet (0.4 mg total) under the tongue every 5 (five) minutes as  needed for chest pain.   20 tablet   1   . olmesartan (BENICAR) 40 MG tablet   Oral   Take 0.5 tablets (20 mg total) by mouth every morning.   30 tablet   1   . oxyCODONE-acetaminophen (PERCOCET/ROXICET) 5-325 MG per tablet   Oral   Take 1 tablet by mouth every 4 (four) hours as needed for pain.         . pantoprazole (PROTONIX) 40 MG tablet   Oral   Take 40 mg by mouth 2 (two) times daily.          Marland Kitchen senna (SENOKOT) 8.6 MG TABS   Oral   Take 1 tablet by mouth at bedtime.         . simvastatin (ZOCOR) 40 MG tablet   Oral   Take 1 tablet (40 mg total) by mouth every evening.   30 tablet   2     BP 132/82  Pulse 51  Temp(Src) 98 F (36.7 C) (Oral)  Resp 20  SpO2 95%  Physical Exam  Nursing note and vitals reviewed.  57 year old male, resting comfortably and in no acute distress. Vital signs are significant for bradycardia with heart rate of 51. Oxygen saturation is 95%, which is normal. Head is normocephalic and atraumatic. PERRLA, EOMI. Oropharynx is clear. Neck is nontender and supple without adenopathy or JVD. Back is nontender and there is no CVA tenderness. Lungs are clear without rales, wheezes, or rhonchi. Chest is nontender. Heart has regular rate and rhythm without murmur. Abdomen is soft, flat, nontender without masses or hepatosplenomegaly and peristalsis is normoactive. Extremities have no cyanosis or edema, full range of motion is present. Skin is warm and dry without rash. Neurologic: Mental status is normal, cranial nerves are intact, there are no motor or sensory deficits.  ED Course  Procedures (including critical care time)  Results for orders placed during the hospital encounter of 01/16/13  CBC      Result Value Range   WBC 6.2  4.0 - 10.5 K/uL   RBC 4.86  4.22 - 5.81 MIL/uL   Hemoglobin 14.4  13.0 - 17.0 g/dL   HCT 16.1  09.6 - 04.5 %   MCV 86.0  78.0 - 100.0 fL   MCH 29.6  26.0 - 34.0 pg   MCHC 34.4  30.0 - 36.0 g/dL   RDW  45.4  09.8 - 11.9 %   Platelets 130 (*) 150 - 400 K/uL  BASIC METABOLIC PANEL      Result Value Range   Sodium 141  135 - 145 mEq/L   Potassium 4.3  3.5 - 5.1 mEq/L   Chloride 104  96 - 112 mEq/L   CO2 31  19 - 32 mEq/L   Glucose, Bld 91  70 - 99 mg/dL   BUN 12  6 - 23 mg/dL   Creatinine, Ser 1.47  0.50 - 1.35 mg/dL   Calcium 9.6  8.4 - 82.9 mg/dL   GFR calc non Af Amer 62 (*) >90 mL/min   GFR calc Af Amer 72 (*) >90 mL/min  D-DIMER, QUANTITATIVE      Result Value Range   D-Dimer, Quant 0.30  0.00 - 0.48 ug/mL-FEU  PRO B NATRIURETIC PEPTIDE      Result Value Range   Pro B Natriuretic peptide (BNP) 110.4  0 - 125 pg/mL  TROPONIN I      Result Value Range   Troponin I <0.30  <0.30 ng/mL   Dg Chest 2 View  01/16/2013  *RADIOLOGY REPORT*  Clinical Data: Left-sided chest pain shortness of breath.  CHEST - 2 VIEW  Comparison: 12/04/2012  Findings: The lungs are clear without focal infiltrate, edema, pneumothorax or pleural effusion.  Hyperexpansion with attenuation of peripheral markings suggests emphysema. The cardiopericardial silhouette is within normal limits for size. Bones are diffusely demineralized.  IMPRESSION: Stable.  No acute cardiopulmonary findings.   Original Report Authenticated By: Kennith Center, M.D.       Date: 01/16/2013  Rate: 54  Rhythm: sinus bradycardia  QRS Axis: normal  Intervals: normal  ST/T Wave abnormalities: normal  Conduction Disutrbances:Incomplete right bundle-branch block  Narrative Interpretation: Sinus bradycardia, incomplete right bundle-branch block. No prior ECG available for comparison.  Old EKG Reviewed: unchanged    1. Chest pain   2. Dyspnea       MDM  Chest pain which seems quite atypical. I reviewed his cardiac catheterization report and right coronary artery had diffuse moderate to severe disease but was described as a nondominant vessel. He'll be given a trial of nitroglycerin and troponin will be checked. At the very least, he  will need to be kept for serial troponins once his chest pain has resolved.  His pain actually resolved before he got the nitroglycerin. Workup is negative including negative d-dimer, a normal BNP, and normal troponin. Case is discussed with Dr. Gwenlyn Perking of [facility] who agrees to admit him for serial cardiac markers.      Dione Booze, MD 01/16/13 [phone]

## 2013-01-16 NOTE — Consult Note (Signed)
Reason for Consult: Chest pain Referring Physician: [facility] Primary cardiologist: Dr. Clifton James PCP: Dr. Zorita Pang is an 57 y.o. male.  HPI: This 57 year old Caucasian male is readmitted because of recurrent chest pain and shortness of breath.  He has been having shortness of breath for several weeks and has had some intermittent precordial chest tightness which does not appear to be related to any particular activity or time of day.  The patient has also had some discomfort in his left neck and his left arm which he attributes to his chronic spinal problems.  He has had previous cervical disc surgery as well as lumbar sacral spinal surgery. The patient has known ischemic heart disease and on 12/05/12 underwent cardiac catheterization by Dr. Algie Coffer and subsequent PCI by Dr. Clifton James.  He has remained on aspirin and Plavix.  He has never used any of his sublingual nitroglycerin at home.  For the past 36 hours he has been experiencing some intermittent left-sided chest discomfort which is similar but less intense than it was on his initial admission in March.  He called the office earlier today with these complaints and was advised to come to the emergency room  Past Medical History  Diagnosis Date  . Renal disorder     renal calculi  . Stomach pain     chronic  . CAD (coronary artery disease)     a. LHC 12/05/12: Ostial LAD 20%, proximal LAD 50%, ostial OM1 30%, superior branch of OM2 ostial to proximal 90%, smaller inferior branch proximal 70-80%, non-dominant RCA with diffuse moderate to severe disease throughout the proximal two thirds, EF 55-60%. => b. PCI: Promus Premier DES to sup br of OM2  . HLD (hyperlipidemia)   . HTN (hypertension)     Past Surgical History  Procedure Laterality Date  . Cholecystectomy    . Back surgery      2 cervical/ 2 lumbar fusions  . Coronary stent placement      Family History  Problem Relation Age of Onset  . Heart disease  Brother     Social History:  reports that he quit smoking about 6 weeks ago. His smoking use included Cigarettes. He started smoking about 36 years ago. He smoked 0.50 packs per day. He does not have any smokeless tobacco history on file. He reports that he does not drink alcohol or use illicit drugs.  Allergies:  Allergies  Allergen Reactions  . Latex Hives and Rash    Medications: Scheduled:   Results for orders placed during the hospital encounter of 01/16/13 (from the past 48 hour(s))  CBC     Status: Abnormal   Collection Time    01/16/13  3:09 PM      Result Value Range   WBC 6.2  4.0 - 10.5 K/uL   RBC 4.86  4.22 - 5.81 MIL/uL   Hemoglobin 14.4  13.0 - 17.0 g/dL   HCT 16.1  09.6 - 04.5 %   MCV 86.0  78.0 - 100.0 fL   MCH 29.6  26.0 - 34.0 pg   MCHC 34.4  30.0 - 36.0 g/dL   RDW 40.9  81.1 - 91.4 %   Platelets 130 (*) 150 - 400 K/uL  BASIC METABOLIC PANEL     Status: Abnormal   Collection Time    01/16/13  3:09 PM      Result Value Range   Sodium 141  135 - 145 mEq/L   Potassium 4.3  3.5 -  5.1 mEq/L   Chloride 104  96 - 112 mEq/L   CO2 31  19 - 32 mEq/L   Glucose, Bld 91  70 - 99 mg/dL   BUN 12  6 - 23 mg/dL   Creatinine, Ser 0.45  0.50 - 1.35 mg/dL   Calcium 9.6  8.4 - 40.9 mg/dL   GFR calc non Af Amer 62 (*) >90 mL/min   GFR calc Af Amer 72 (*) >90 mL/min   Comment:            The eGFR has been calculated     using the CKD EPI equation.     This calculation has not been     validated in all clinical     situations.     eGFR's persistently     <90 mL/min signify     possible Chronic Kidney Disease.  D-DIMER, QUANTITATIVE     Status: None   Collection Time    01/16/13  3:53 PM      Result Value Range   D-Dimer, Quant 0.30  0.00 - 0.48 ug/mL-FEU   Comment:            AT THE INHOUSE ESTABLISHED CUTOFF     VALUE OF 0.48 ug/mL FEU,     THIS ASSAY HAS BEEN DOCUMENTED     IN THE LITERATURE TO HAVE     A SENSITIVITY AND NEGATIVE     PREDICTIVE VALUE OF AT  LEAST     98 TO 99%.  THE TEST RESULT     SHOULD BE CORRELATED WITH     AN ASSESSMENT OF THE CLINICAL     PROBABILITY OF DVT / VTE.  PRO B NATRIURETIC PEPTIDE     Status: None   Collection Time    01/16/13  3:59 PM      Result Value Range   Pro B Natriuretic peptide (BNP) 110.4  0 - 125 pg/mL  TROPONIN I     Status: None   Collection Time    01/16/13  4:00 PM      Result Value Range   Troponin I <0.30  <0.30 ng/mL   Comment:            Due to the release kinetics of cTnI,     a negative result within the first hours     of the onset of symptoms does not rule out     myocardial infarction with certainty.     If myocardial infarction is still suspected,     repeat the test at appropriate intervals.    Dg Chest 2 View  01/16/2013  *RADIOLOGY REPORT*  Clinical Data: Left-sided chest pain shortness of breath.  CHEST - 2 VIEW  Comparison: 12/04/2012  Findings: The lungs are clear without focal infiltrate, edema, pneumothorax or pleural effusion.  Hyperexpansion with attenuation of peripheral markings suggests emphysema. The cardiopericardial silhouette is within normal limits for size. Bones are diffusely demineralized.  IMPRESSION: Stable.  No acute cardiopulmonary findings.   Original Report Authenticated By: Kennith Center, M.D.     ROS: The patient has had a long history of epigastric discomfort.  He is status post cholecystectomy.  He has had multiple endoscopies here as well as at [facility] and he is also scheduled to see a gastroenterologist at [facility] soon.  He is being evaluated there because of intermittent epigastric pain which is very similar to his previous gallbladder pain.  Blood pressure 129/81, pulse 72, temperature 98 F (36.7  C), temperature source Oral, resp. rate 17, SpO2 100.00%.  The patient appears to be in no distress.  Head and neck exam reveals that the pupils are equal and reactive.  The extraocular movements are full.  There is no scleral icterus.   Mouth and pharynx are benign.  No lymphadenopathy.  No carotid bruits.  The jugular venous pressure is normal.  Thyroid is not enlarged or tender.  There is a scar over the cervical spine.  Chest is clear to percussion and auscultation.  No rales or rhonchi.  Expansion of the chest is symmetrical.  The chest wall is not tender to palpation.  Heart reveals no abnormal lift or heave.  First and second heart sounds are normal.  There is no murmur gallop rub or click.  The abdomen is soft and nontender.  Bowel sounds are normoactive.  There is no hepatosplenomegaly or mass.  There are no abdominal bruits.  Extremities reveal no phlebitis or edema.  Pedal pulses are good.  There is no cyanosis or clubbing.  Neurologic exam is normal strength and no lateralizing weakness.  No sensory deficits.  Integument reveals no rash  EKG shows normal sinus rhythm and incomplete right bundle branch block and no ischemic changes at rest.  Assessment/Plan: 1. chest pain very similar to the way he presented to the hospital in early March prior to his PCI.  His chest pain is intermittent and at the present time he is pain-free.  We will cycle his enzymes overnight and repeat his EKG in the morning.  I would have a low threshold for repeating his cardiac catheter.  I will keep him n.p.o. after midnight tonight to preserve options for possible catheterization versus nuclear study tomorrow morning. Continue aspirin Plavix beta blocker and statins.  Cassell Clement 01/16/2013, 7:31 PM

## 2013-01-16 NOTE — H&P (Signed)
[facility] History and Physical  Russell Padilla ZOX:[id number] DOB: [birth year] DOA: 01/16/2013  Referring physician: Dr. Preston Fleeting PCP: Sissy Hoff, MD  Specialists: [facility]   Chief Complaint: chest pain  HPI: Russell Padilla is a 57 y.o. male with pmh significant for CAD, HTN, HLD, GERD and recent NSTEMI with PCI (march 2014); came to ED complaining of chest pain on his left side and associated SOB. Patient reports is unchange with exertion and rest and happens intermittently. Has been present for the last 36 hours according to him. In ED troponin, neg, CXR w/o acute cardiopulmonary process and EKG unchanged from prior EKG. [facility] called to admit patient to r/o ACs. Patient endorses been fatigue all the time.  No hematemesis, no vomiting, no nausea, no Ha's or any other acute complaints.   Review of Systems:  Negative except as mentioned on HPI  Past Medical History  Diagnosis Date  . Renal disorder     renal calculi  . Stomach pain     chronic  . CAD (coronary artery disease)     a. LHC 12/05/12: Ostial LAD 20%, proximal LAD 50%, ostial OM1 30%, superior branch of OM2 ostial to proximal 90%, smaller inferior branch proximal 70-80%, non-dominant RCA with diffuse moderate to severe disease throughout the proximal two thirds, EF 55-60%. => b. PCI: Promus Premier DES to sup br of OM2  . HLD (hyperlipidemia)   . HTN (hypertension)    Past Surgical History  Procedure Laterality Date  . Cholecystectomy    . Back surgery      2 cervical/ 2 lumbar fusions  . Coronary stent placement     Social History:  reports that he quit smoking about 6 weeks ago. His smoking use included Cigarettes. He started smoking about 36 years ago. He smoked 0.50 packs per day. He does not have any smokeless tobacco history on file. He reports that he does not drink alcohol or use illicit drugs. Lives at home with wife; no assistance with ADL's  Allergies  Allergen Reactions  . Latex Hives and  Rash    Family History  Problem Relation Age of Onset  . Heart disease Brother     Prior to Admission medications   Medication Sig Start Date End Date Taking? Authorizing Provider  aspirin EC 81 MG tablet Take 81 mg by mouth daily.   Yes Historical Provider, MD  clopidogrel (PLAVIX) 75 MG tablet Take 1 tablet (75 mg total) by mouth daily with breakfast. 12/07/12  Yes Rhetta Mura, MD  cyclobenzaprine (FLEXERIL) 10 MG tablet Take 10 mg by mouth 3 (three) times daily as needed for muscle spasms.   Yes Historical Provider, MD  dicyclomine (BENTYL) 20 MG tablet Take 20 mg by mouth 2 (two) times daily.    Yes Historical Provider, MD  docusate sodium (COLACE) 100 MG capsule Take 100 mg by mouth 2 (two) times daily.    Yes Historical Provider, MD  hydrocortisone (ANUSOL-HC) 2.5 % rectal cream Place 1 application rectally as needed for hemorrhoids.   Yes Historical Provider, MD  metoprolol succinate (TOPROL-XL) 25 MG 24 hr tablet Take 1 tablet (25 mg total) by mouth daily. 12/07/12  Yes Rhetta Mura, MD  mometasone (NASONEX) 50 MCG/ACT nasal spray Place 2 sprays into the nose at bedtime as needed (allergies).   Yes Historical Provider, MD  Multiple Vitamin (MULTIVITAMIN WITH MINERALS) TABS Take 1 tablet by mouth daily.   Yes Historical Provider, MD  oxyCODONE-acetaminophen (PERCOCET/ROXICET) 5-325 MG per tablet  Take 1 tablet by mouth every 4 (four) hours as needed for pain.   Yes Historical Provider, MD  pantoprazole (PROTONIX) 40 MG tablet Take 40 mg by mouth 2 (two) times daily.    Yes Historical Provider, MD  Probiotic Product (PROBIOTIC DAILY PO) Take 1 tablet by mouth daily.   Yes Historical Provider, MD  senna (SENOKOT) 8.6 MG TABS Take 2 tablets by mouth at bedtime.    Yes Historical Provider, MD  simvastatin (ZOCOR) 40 MG tablet Take 1 tablet (40 mg total) by mouth every evening. 12/07/12  Yes Rhetta Mura, MD  nitroGLYCERIN (NITROSTAT) 0.4 MG SL tablet Place 1 tablet (0.4 mg  total) under the tongue every 5 (five) minutes as needed for chest pain. 12/07/12   Rhetta Mura, MD   Physical Exam: Filed Vitals:   01/16/13 1645 01/16/13 1700 01/16/13 1715 01/16/13 1730  BP: 118/69 120/64 134/66 106/86  Pulse: 47 49 50 47  Temp:      TempSrc:      Resp: 17 18 12 17   SpO2: 97% 98% 100% 100%     General:  NAD, afebrile, denies nausea or vomiting; no CP currently  Eyes: PERRL, EOMI, no icterus  ENT: no drainage out of ears or nostrils, no erythema or exudates inside his mouth  Neck: no JVD, supple, no thyromegaly  Cardiovascular: S1 and S2, no rubs or gallops; bradycardia  Respiratory: CTA bilaterally  Abdomen: soft, mid epigastric discomfort with deep palpation, positive BS  Skin: no rash or petechiae  Musculoskeletal: no joint swelling or erythema  Psychiatric: appropriate mood, no SI or hallucinations  Neurologic: CN intact, no motor or sensory deficit; AAOX3  Labs on Admission:  Basic Metabolic Panel:  Recent Labs Lab 01/16/13 1509  NA 141  K 4.3  CL 104  CO2 31  GLUCOSE 91  BUN 12  CREATININE 1.24  CALCIUM 9.6   CBC:  Recent Labs Lab 01/16/13 1509  WBC 6.2  HGB 14.4  HCT 41.8  MCV 86.0  PLT 130*   Cardiac Enzymes:  Recent Labs Lab 01/16/13 1600  TROPONINI <0.30    BNP (last 3 results)  Recent Labs  01/16/13 1559  PROBNP 110.4    Radiological Exams on Admission: Dg Chest 2 View  01/16/2013  *RADIOLOGY REPORT*  Clinical Data: Left-sided chest pain shortness of breath.  CHEST - 2 VIEW  Comparison: 12/04/2012  Findings: The lungs are clear without focal infiltrate, edema, pneumothorax or pleural effusion.  Hyperexpansion with attenuation of peripheral markings suggests emphysema. The cardiopericardial silhouette is within normal limits for size. Bones are diffusely demineralized.  IMPRESSION: Stable.  No acute cardiopulmonary findings.   Original Report Authenticated By: Kennith Center, M.D.     EKG: Rate: 54   Rhythm: sinus bradycardia  QRS Axis: normal  Intervals: normal  ST/T Wave abnormalities: normal  Conduction Disutrbances:Incomplete right bundle-branch block  Narrative Interpretation: Sinus bradycardia, incomplete right bundle-branch block. No prior ECG available for comparison.  Old EKG Reviewed: unchanged  Assessment/Plan 1-Chest pain: presentation atypical; but with some typical components and significant risk factors, including recent MI and PCI. -continue ASA -continue plavix -continue b-blocker and statins -admit to telemetry -cycle cardiac enzymes and EKG -cardiology consulted to help deciding further evaluation and treatment (recent admission in March required cath and found positive CAD and needs for PCI)  2-HYPERLIPIDEMIA:continue statins  3-THROMBOCYTOPENIA, CHRONIC: stable. Will monitor. No signs of bleeding  4-HYPERTENSION: well controlled. Will continue low sodium diet and current medication.  5-GERD: continue PPI  twice a day and will also start carafate  6-Bradycardia:HR into the mid 40's and patient fatigue and tired all the time. Will cut in half is b-blocker at this moment and follow any further recommendations from cardiology  DVT: heparin  [facility]  Code Status: Full Family Communication: wife at bedside Disposition Plan: observation, CP; LOS < 2 midnights  Time spent: >30 minutes  Erminia Mcnew [facility] Pager [phone]  If 7PM-7AM, please contact night-coverage [email or web address] Password [facility] 01/16/2013, 6:47 PM

## 2013-01-16 NOTE — Telephone Encounter (Signed)
Spoke with pt. He reports he has had chest pain off and on for last couple of days. Not exertional. 2 episodes yesterday. Also with shortness of breath and fatigue for last 2 weeks.  Currently having pain which he rates as a 3 on a 1-10 scale. I told pt he should call EMS and go to ED to be evaluated.

## 2013-01-16 NOTE — ED Notes (Signed)
Pt denies CP and doesn't want to take Nitro at this time.

## 2013-01-17 ENCOUNTER — Encounter (HOSPITAL_COMMUNITY)
Admission: EM | Disposition: A | Discharge status: Home / Self Care | Payer: Self-pay | Source: Home / Self Care | Attending: Emergency Medicine

## 2013-01-17 DIAGNOSIS — D696 Thrombocytopenia, unspecified: Secondary | ICD-10-CM

## 2013-01-17 DIAGNOSIS — K219 Gastro-esophageal reflux disease without esophagitis: Secondary | ICD-10-CM

## 2013-01-17 DIAGNOSIS — I2510 Atherosclerotic heart disease of native coronary artery without angina pectoris: Secondary | ICD-10-CM

## 2013-01-17 DIAGNOSIS — I498 Other specified cardiac arrhythmias: Secondary | ICD-10-CM

## 2013-01-17 HISTORY — PX: LEFT HEART CATHETERIZATION WITH CORONARY ANGIOGRAM: SHX5451

## 2013-01-17 LAB — BASIC METABOLIC PANEL
GFR calc Af Amer: 75 mL/min — ABNORMAL LOW (ref >90)
GFR calc non Af Amer: 64 mL/min — ABNORMAL LOW (ref >90)
Glucose, Bld: 85 mg/dL (ref 70–99)
Potassium: 4.2 mEq/L (ref 3.5–5.1)
Sodium: 140 mEq/L (ref 135–145)

## 2013-01-17 LAB — CBC
HCT: 37.4 % — ABNORMAL LOW (ref 39.0–52.0)
MCHC: 35.8 g/dL (ref 30.0–36.0)
WBC: 4.9 10*3/uL (ref 4.0–10.5)

## 2013-01-17 SURGERY — LEFT HEART CATHETERIZATION WITH CORONARY ANGIOGRAM
Anesthesia: LOCAL

## 2013-01-17 MED ORDER — DIAZEPAM 5 MG PO TABS
5.0000 mg | ORAL_TABLET | ORAL | Status: AC
  Administered 2013-01-17: 5 mg via ORAL
  Filled 2013-01-17: qty 1

## 2013-01-17 MED ORDER — SODIUM CHLORIDE 0.9 % IV SOLN
1.0000 mL/kg/h | INTRAVENOUS | Status: DC
  Administered 2013-01-17: 1 mL/kg/h via INTRAVENOUS

## 2013-01-17 MED ORDER — SODIUM CHLORIDE 0.9 % IJ SOLN: 3.0000 mL | INTRAMUSCULAR | Status: DC | PRN

## 2013-01-17 MED ORDER — HEPARIN SODIUM (PORCINE) 1000 UNIT/ML IJ SOLN
INTRAMUSCULAR | Status: AC
  Filled 2013-01-17: qty 1

## 2013-01-17 MED ORDER — VERAPAMIL HCL 2.5 MG/ML IV SOLN
INTRAVENOUS | Status: AC
  Filled 2013-01-17: qty 2

## 2013-01-17 MED ORDER — ASPIRIN 81 MG PO CHEW
324.0000 mg | CHEWABLE_TABLET | ORAL | Status: AC
  Administered 2013-01-17: 324 mg via ORAL
  Filled 2013-01-17: qty 4

## 2013-01-17 MED ORDER — SODIUM CHLORIDE 0.9 % IV SOLN: 1.0000 mL/kg/h | INTRAVENOUS | Status: DC

## 2013-01-17 MED ORDER — ASPIRIN 81 MG PO CHEW: 324.0000 mg | CHEWABLE_TABLET | ORAL | Status: DC

## 2013-01-17 MED ORDER — SODIUM CHLORIDE 0.9 % IV SOLN: INTRAVENOUS | Status: DC

## 2013-01-17 MED ORDER — SODIUM CHLORIDE 0.9 % IV SOLN: 250.0000 mL | INTRAVENOUS | Status: DC | PRN

## 2013-01-17 MED ORDER — LIDOCAINE HCL (PF) 1 % IJ SOLN
INTRAMUSCULAR | Status: AC
  Filled 2013-01-17: qty 30

## 2013-01-17 MED ORDER — MIDAZOLAM HCL 2 MG/2ML IJ SOLN
INTRAMUSCULAR | Status: AC
  Filled 2013-01-17: qty 2

## 2013-01-17 MED ORDER — SODIUM CHLORIDE 0.9 % IJ SOLN
3.0000 mL | Freq: Two times a day (BID) | INTRAMUSCULAR | Status: DC
  Administered 2013-01-17: 3 mL via INTRAVENOUS

## 2013-01-17 MED ORDER — HEPARIN (PORCINE) IN NACL 2-0.9 UNIT/ML-% IJ SOLN
INTRAMUSCULAR | Status: AC
  Filled 2013-01-17: qty 1500

## 2013-01-17 MED ORDER — DIAZEPAM 5 MG PO TABS: 5.0000 mg | ORAL_TABLET | ORAL | Status: DC

## 2013-01-17 MED ORDER — FENTANYL CITRATE 0.05 MG/ML IJ SOLN
INTRAMUSCULAR | Status: AC
  Filled 2013-01-17: qty 2

## 2013-01-17 NOTE — Discharge Summary (Signed)
Physician Discharge Summary  Russell Padilla:[id number] DOB: 01/17/1956 DOA: 01/16/2013  PCP: Sissy Hoff, MD  Admit date: 01/16/2013 Discharge date: 01/17/2013  Time spent: 35 minutes  Recommendations for Outpatient Follow-up:  1. Follow up with cards in 4 weeks.  Discharge Diagnoses:  Principal Problem:   Chest pain Active Problems:   HYPERLIPIDEMIA   THROMBOCYTOPENIA, CHRONIC   HYPERTENSION   GERD   Bradycardia   Discharge Condition: stable  Diet recommendation: heart healthy diet  Filed Weights   01/16/13 1958  Weight: 96.934 kg (213 lb 11.2 oz)    History of present illness:  57 y.o. male with pmh significant for CAD, HTN, HLD, GERD and recent NSTEMI with PCI (march 2014); came to ED complaining of chest pain on his left side and associated SOB. Patient reports is unchange with exertion and rest and happens intermittently. Has been present for the last 36 hours according to him. In ED troponin, neg, CXR w/o acute cardiopulmonary process and EKG unchanged from prior EKG. TRH called to admit patient to r/o ACs.  Patient endorses been fatigue all the time.   Hospital Course: Atypical chest pain: - Cont on ASA, Plavix, BB and statins. - cardiac markers negative x3, EKG check showednormal sinus rhythm and incomplete right bundle branch block and no ischemic changes at rest. - No events on telemtry. - cardiac cath :Patent stent in OM 3 with no significant restenosis. Residual disease in the inferior branch of OM 3 as well as and nondominant RCA which is unchanged from most recent catheterization.  2. Normal LV systolic function and left ventricular end-diastolic pressure. - mildy bradycardic metoprolol stopped. - follow up with cards 4 weeks.    Procedures:  Cardiac cath  Consultations:  cardiology  Discharge Exam: Filed Vitals:   01/16/13 1838 01/16/13 1839 01/16/13 1958 01/17/13 0533  BP: 129/81  115/86 119/60  Pulse:  72 55 50  Temp:   97.4 F (36.3  C) 97.9 F (36.6 C)  TempSrc:   Oral Oral  Resp:      Height:   5\' 11"  (1.803 m)   Weight:   96.934 kg (213 lb 11.2 oz)   SpO2:  100% 100% 97%    General: A&O x3 Cardiovascular: RRR Respiratory: good air movement CAT B/L  Discharge Instructions  Discharge Orders   Future Appointments Provider Department Dept Phone   01/21/2013 11:00 AM [facility] [place] [facility] [phone]   01/23/2013 11:00 AM [facility] [place] [facility] [phone]   01/25/2013 11:00 AM [facility] 6 [place] [facility] [phone]   01/28/2013 11:00 AM [facility] 6 [place] [facility] [phone]   01/30/2013 11:00 AM [facility] [place] [facility] [phone]   02/01/2013 11:00 AM [facility] 6 [place] [facility] [phone]   02/04/2013 11:00 AM [facility] 6 [place] [facility] [phone]   02/06/2013 11:00 AM [facility] 6 [place] [facility] [phone]   02/08/2013 11:00 AM [facility] 6 [place] [facility] [phone]   02/11/2013 11:00 AM [facility] 6 [place] [facility] [phone]   02/11/2013 4:30 PM Kathleene Hazel, MD [facility] Main Office [place]) [phone]   02/13/2013 11:00 AM [facility] 6 [facility] [phone]   02/15/2013 11:00 AM [facility] 6 [place] [facility] [phone]   02/18/2013 11:00 AM [facility] 6  [facility] [phone]   02/20/2013 11:00 AM [facility] 6   [facility] [phone]   02/22/2013 11:00 AM [facility] 6 [place] [facility] [phone]   02/25/2013 11:00 AM [facility] Monitor 6 [place] [facility] [phone]   02/27/2013 11:00 AM [facility] 6 [place] [facility] [phone]   03/01/2013 11:00 AM  [facility] 6 [place] [facility] [phone]   03/04/2013 11:00 AM [facility] 6 [place] [facility] [phone]   03/06/2013 11:00 AM [facility] 6 [place] [facility] [phone]   03/08/2013 11:00 AM [facility] 6 [place] [facility] [phone]   03/11/2013 11:00 AM [facility] 6 [place] [facility] [phone]   03/13/2013 11:00 AM [facility] Monitor 6 [place] [facility] [phone]   03/15/2013 11:00 AM [facility] 6 [place] [facility] [phone]   03/18/2013 11:00 AM Ap[facility] Monitor 6 [place] [facility] [phone]   03/20/2013 11:00 AM Ap-Cre[facility] Monitor 6 [place] [facility] [phone]   03/22/2013 11:00 AM Ap[facility] Monitor 6 [place] [facility] [phone]   03/25/2013 11:00 AM Ap[facility] Monitor 6 [place] [facility] [phone]   03/27/2013 11:00 AM [facility] Monitor 6 [place] [facility] [phone]   03/29/2013 11:00 AM Ap[facility] Monitor 6 [place] [facility] [phone]   04/01/2013 11:00 AM Ap[facility] Monitor 6 [place] [facility] [phone]   04/03/2013 11:00 AM Ap[facility] Monitor 6 [place] [facility] [phone]   04/05/2013 11:00 AM Ap-Cre[facility] Monitor 6 [place] [facility] [phone]   04/08/2013 11:00 AM Ap[facility] Monitor 6 [place] [facility] [phone]   04/10/2013 11:00 AM [facility] Monitor 6  [facility] [phone]   04/12/2013 11:00 AM [facility] Monitor 6  [facility] [phone]   Future Orders Complete By Expires     Diet - low sodium heart healthy  As directed     Increase activity slowly  As directed         Medication List    STOP taking these medications       metoprolol succinate 25 MG 24 hr tablet  Commonly known as:  TOPROL-XL      TAKE these medications        aspirin EC 81 MG tablet  Take 81 mg by mouth daily.     clopidogrel 75 MG tablet  Commonly known as:  PLAVIX  Take 1 tablet (75 mg total) by mouth daily with breakfast.     cyclobenzaprine 10 MG tablet  Commonly known as:  FLEXERIL  Take 10 mg by mouth 3 (three) times daily as needed for muscle spasms.     dicyclomine 20 MG tablet  Commonly known as:  BENTYL  Take 20 mg by mouth 2 (two) times daily.     docusate sodium 100 MG capsule  Commonly known as:  COLACE  Take 100 mg by mouth 2 (two) times daily.     hydrocortisone 2.5 % rectal cream  Commonly known as:  ANUSOL-HC  Place 1 application rectally as needed for hemorrhoids.     mometasone 50 MCG/ACT nasal spray  Commonly known as:  NASONEX  Place 2 sprays into the nose at bedtime as needed (allergies).     multivitamin with minerals Tabs  Take 1 tablet by mouth daily.     nitroGLYCERIN 0.4 MG SL tablet  Commonly known as:  NITROSTAT  Place 1 tablet (0.4 mg total) under the tongue every 5 (five) minutes as needed for chest pain.     oxyCODONE-acetaminophen 5-325 MG per tablet  Commonly known as:  PERCOCET/ROXICET  Take 1 tablet by mouth every 4 (four) hours as needed for pain.     pantoprazole 40 MG tablet  Commonly known as:  PROTONIX  Take 40 mg by mouth 2 (two) times daily.     PROBIOTIC DAILY PO  Take 1 tablet by mouth daily.     senna 8.6 MG Tabs  Commonly known as:  SENOKOT  Take 2 tablets by mouth at bedtime.     simvastatin 40 MG tablet  Commonly known as:  ZOCOR  Take 1 tablet (40 mg total) by mouth every evening.           Follow-up Information   Follow up with Sissy Hoff, MD In 4 weeks. (follow up with cardiology)    Contact information:   [address] [place] [address] [phone]        The results of significant diagnostics from this hospitalization (including imaging, microbiology, ancillary and laboratory) are listed below for reference.    Significant  Diagnostic Studies: Dg Chest 2 View  01/16/2013  *RADIOLOGY REPORT*  Clinical Data: Left-sided chest pain shortness of breath.  CHEST - 2 VIEW  Comparison: 12/04/2012  Findings: The lungs are clear without focal infiltrate, edema, pneumothorax or pleural effusion.  Hyperexpansion with attenuation of peripheral markings suggests emphysema. The cardiopericardial silhouette is within normal limits for size. Bones are diffusely demineralized.  IMPRESSION: Stable.  No acute cardiopulmonary findings.   Original Report Authenticated By: Kennith Center, M.D.     Microbiology: No results found for this or any previous visit (from the past 240 hour(s)).   Labs: Basic Metabolic Panel:  Recent Labs Lab 01/16/13 1509 01/16/13 2001 01/17/13 0158  NA 141  --  140  K 4.3  --  4.2  CL 104  --  104  CO2 31  --  30  GLUCOSE 91  --  85  BUN 12  --  13  CREATININE 1.24 1.18 1.21  CALCIUM 9.6  --  9.3   Liver Function Tests: No results found for this basename: AST, ALT, ALKPHOS, BILITOT, PROT, ALBUMIN,  in the last 168 hours No results found for this basename: LIPASE, AMYLASE,  in the last 168 hours No results found for this basename: AMMONIA,  in the last 168 hours CBC:  Recent Labs Lab 01/16/13 1509 01/16/13 2001 01/17/13 0158  WBC 6.2 5.8 4.9  HGB 14.4 14.4 13.4  HCT 41.8 41.2 37.4*  MCV 86.0 86.0 85.0  PLT 130* 123* 124*   Cardiac Enzymes:  Recent Labs Lab 01/16/13 1600 01/16/13 2001 01/17/13 0158 01/17/13 0809  TROPONINI <0.30 <0.30 <0.30 <0.30   BNP: BNP (last 3 results)  Recent Labs  01/16/13 1559  PROBNP 110.4   CBG: No results found for this basename: GLUCAP,  in the last 168 hours     Signed:  Marinda Elk  [facility] 01/17/2013, 12:32 PM

## 2013-01-17 NOTE — CV Procedure (Signed)
   Cardiac Catheterization Procedure Note  Name: Russell Padilla MRN: [id number] DOB: [birth year]  Procedure: Left Heart Cath, Selective Coronary Angiography, LV angiography  Indication: Chest pain possible unstable angina  Medications:  Sedation:  2 mg IV Versed, 50 mcg IV Fentanyl     Procedural Details: The right wrist was prepped, draped, and anesthetized with 1% lidocaine. Using the modified Seldinger technique, a 5 French sheath was introduced into the right radial artery. 3 mg of verapamil was administered through the sheath, weight-based unfractionated heparin was administered intravenously. A JACKIE catheter was used for selective coronary angiography and left ventriculography.  There were no immediate procedural complications. A TR band was used for radial hemostasis at the completion of the procedure.  The patient was transferred to the post catheterization recovery area for further monitoring.  Procedural Findings:  Hemodynamics: AO:  120/62   mmHg LV:  122/7    mmHg LVEDP: 10  mmHg  Coronary angiography: Coronary dominance: Left   Left Main:  Normal.  Left Anterior Descending (LAD):  Normal in size with diffuse 20-30% disease in the midsegment. No evidence of obstructive disease.  1st diagonal (D1):  Large in size with minor irregularities.  Circumflex (LCx):  Large in size and dominant. The vessel has mild diffuse atherosclerosis.  1st obtuse marginal:  Very small in size with minor irregularities.  2nd obtuse marginal:  Normal in size with 20% proximal disease.  3rd obtuse marginal:  Patent stent proximally with no significant restenosis. The inferior branch of this is jailed by the stent with 60-70 % proximal disease which is unchanged . The branches overall small in size.  The AV groove left circumflex is normal in size with no significant disease. It supplies a normal size PDA and small PL branches.  Right Coronary Artery: Medium in size and nondominant. There  is diffuse 70-80% disease throughout its course.   Left ventriculography: Left ventricular systolic function is normal , LVEF is estimated at 60 %, there is no significant mitral regurgitation   Final Conclusions:   1. Patent stent in OM 3 with no significant restenosis. Residual disease in the inferior branch of OM 3 as well as and nondominant RCA which is unchanged from most recent catheterization. 2. Normal LV systolic function and left ventricular end-diastolic pressure.  Recommendations:  Continue medical therapy.  [name] MD, [facility] 01/17/2013, 11:37 AM

## 2013-01-17 NOTE — H&P (View-Only) (Signed)
 Subjective:  No further chest pain.  Did not sleep well. EKG this am no change. Enzymes negative.  Objective:  Vital Signs in the last 24 hours: Temp:  [97.4 F (36.3 C)-98 F (36.7 C)] 97.9 F (36.6 C) (04/17 0533) Pulse Rate:  [47-72] 50 (04/17 0533) Resp:  [12-20] 17 (04/16 1730) BP: (106-134)/(60-86) 119/60 mmHg (04/17 0533) SpO2:  [95 %-100 %] 97 % (04/17 0533) Weight:  [213 lb 11.2 oz (96.934 kg)] 213 lb 11.2 oz (96.934 kg) (04/16 1958)  Intake/Output from previous day:   Intake/Output from this shift:    . aspirin EC  81 mg Oral Daily  . clopidogrel  75 mg Oral Q breakfast  . dicyclomine  20 mg Oral BID  . docusate sodium  100 mg Oral BID  . fluticasone  2 spray Each Nare Daily  . heparin  5,000 Units Subcutaneous Q8H  . metoprolol succinate  12.5 mg Oral Q2000  . multivitamin with minerals  1 tablet Oral Daily  . pantoprazole  40 mg Oral BID  . senna  2 tablet Oral QHS  . simvastatin  40 mg Oral q1800  . sodium chloride  3 mL Intravenous Q12H  . sodium chloride  3 mL Intravenous Q12H  . sucralfate  1 g Oral TID WC & HS      Physical Exam: The patient appears to be in no distress.  Head and neck exam reveals that the pupils are equal and reactive.  The extraocular movements are full.  There is no scleral icterus.  Mouth and pharynx are benign.  No lymphadenopathy.  No carotid bruits.  The jugular venous pressure is normal.  Thyroid is not enlarged or tender.  Chest is clear to percussion and auscultation.  No rales or rhonchi.  Expansion of the chest is symmetrical.  Heart reveals no abnormal lift or heave.  First and second heart sounds are normal.  There is no murmur gallop rub or click.  The abdomen is soft and nontender.  Bowel sounds are normoactive.  There is no hepatosplenomegaly or mass.  There are no abdominal bruits.  Extremities reveal no phlebitis or edema.  Pedal pulses are good.  There is no cyanosis or clubbing.  Neurologic exam is normal  strength and no lateralizing weakness.  No sensory deficits.  Integument reveals no rash  Lab Results:  Recent Labs  01/16/13 2001 01/17/13 0158  WBC 5.8 4.9  HGB 14.4 13.4  PLT 123* 124*    Recent Labs  01/16/13 1509 01/16/13 2001 01/17/13 0158  NA 141  --  140  K 4.3  --  4.2  CL 104  --  104  CO2 31  --  30  GLUCOSE 91  --  85  BUN 12  --  13  CREATININE 1.24 1.18 1.21    Recent Labs  01/16/13 2001 01/17/13 0158  TROPONINI <0.30 <0.30   Hepatic Function Panel No results found for this basename: PROT, ALBUMIN, AST, ALT, ALKPHOS, BILITOT, BILIDIR, IBILI,  in the last 72 hours No results found for this basename: CHOL,  in the last 72 hours No results found for this basename: PROTIME,  in the last 72 hours  Imaging: Dg Chest 2 View  01/16/2013  *RADIOLOGY REPORT*  Clinical Data: Left-sided chest pain shortness of breath.  CHEST - 2 VIEW  Comparison: 12/04/2012  Findings: The lungs are clear without focal infiltrate, edema, pneumothorax or pleural effusion.  Hyperexpansion with attenuation of peripheral markings suggests emphysema. The   cardiopericardial silhouette is within normal limits for size. Bones are diffusely demineralized.  IMPRESSION: Stable.  No acute cardiopulmonary findings.   Original Report Authenticated By: Eric Mansell, M.D.     Cardiac Studies: Telemetry shows sinus bradycardia. Assessment/Plan:  Recurrent chest pain and dyspnea prompting admission yesterday.  Options are nuclear stress test vs cath.  He notes that he had nuclear test a year ago that failed to demonstrate that he had widespread CAD.  He is concerned about present state of his coronaries and his recent stent. Will proceed with re-cath today.  Possibly home late today if cath is okay.  LOS: 1 day    Melanie Pellot 01/17/2013, 7:43 AM    

## 2013-01-17 NOTE — Interval H&P Note (Signed)
History and Physical Interval Note:  01/17/2013 10:54 AM  Russell Padilla  has presented today for surgery, with the diagnosis of cp  The various methods of treatment have been discussed with the patient and family. After consideration of risks, benefits and other options for treatment, the patient has consented to  Procedure(s): LEFT HEART CATHETERIZATION WITH CORONARY ANGIOGRAM (N/A) as a surgical intervention .  The patient's history has been reviewed, patient examined, no change in status, stable for surgery.  I have reviewed the patient's chart and labs.  Questions were answered to the patient's satisfaction.     [name]

## 2013-01-17 NOTE — Progress Notes (Signed)
Cath today showed that his stent is open and there was no significant change from the previous cath. He is persistently bradycardic and I have stopped his metoprolol. From the cardiac standpoint he should be able to go home later this afternoon.

## 2013-01-17 NOTE — Progress Notes (Signed)
Subjective:  No further chest pain.  Did not sleep well. EKG this am no change. Enzymes negative.  Objective:  Vital Signs in the last 24 hours: Temp:  [97.4 F (36.3 C)-98 F (36.7 C)] 97.9 F (36.6 C) (04/17 0533) Pulse Rate:  [47-72] 50 (04/17 0533) Resp:  [12-20] 17 (04/16 1730) BP: (106-134)/(60-86) 119/60 mmHg (04/17 0533) SpO2:  [95 %-100 %] 97 % (04/17 0533) Weight:  [213 lb 11.2 oz (96.934 kg)] 213 lb 11.2 oz (96.934 kg) (04/16 1958)  Intake/Output from previous day:   Intake/Output from this shift:    . aspirin EC  81 mg Oral Daily  . clopidogrel  75 mg Oral Q breakfast  . dicyclomine  20 mg Oral BID  . docusate sodium  100 mg Oral BID  . fluticasone  2 spray Each Nare Daily  . heparin  5,000 Units Subcutaneous Q8H  . metoprolol succinate  12.5 mg Oral Q2000  . multivitamin with minerals  1 tablet Oral Daily  . pantoprazole  40 mg Oral BID  . senna  2 tablet Oral QHS  . simvastatin  40 mg Oral q1800  . sodium chloride  3 mL Intravenous Q12H  . sodium chloride  3 mL Intravenous Q12H  . sucralfate  1 g Oral TID WC & HS      Physical Exam: The patient appears to be in no distress.  Head and neck exam reveals that the pupils are equal and reactive.  The extraocular movements are full.  There is no scleral icterus.  Mouth and pharynx are benign.  No lymphadenopathy.  No carotid bruits.  The jugular venous pressure is normal.  Thyroid is not enlarged or tender.  Chest is clear to percussion and auscultation.  No rales or rhonchi.  Expansion of the chest is symmetrical.  Heart reveals no abnormal lift or heave.  First and second heart sounds are normal.  There is no murmur gallop rub or click.  The abdomen is soft and nontender.  Bowel sounds are normoactive.  There is no hepatosplenomegaly or mass.  There are no abdominal bruits.  Extremities reveal no phlebitis or edema.  Pedal pulses are good.  There is no cyanosis or clubbing.  Neurologic exam is normal  strength and no lateralizing weakness.  No sensory deficits.  Integument reveals no rash  Lab Results:  Recent Labs  01/16/13 2001 01/17/13 0158  WBC 5.8 4.9  HGB 14.4 13.4  PLT 123* 124*    Recent Labs  01/16/13 1509 01/16/13 2001 01/17/13 0158  NA 141  --  140  K 4.3  --  4.2  CL 104  --  104  CO2 31  --  30  GLUCOSE 91  --  85  BUN 12  --  13  CREATININE 1.24 1.18 1.21    Recent Labs  01/16/13 2001 01/17/13 0158  TROPONINI <0.30 <0.30   Hepatic Function Panel No results found for this basename: PROT, ALBUMIN, AST, ALT, ALKPHOS, BILITOT, BILIDIR, IBILI,  in the last 72 hours No results found for this basename: CHOL,  in the last 72 hours No results found for this basename: PROTIME,  in the last 72 hours  Imaging: Dg Chest 2 View  01/16/2013  *RADIOLOGY REPORT*  Clinical Data: Left-sided chest pain shortness of breath.  CHEST - 2 VIEW  Comparison: 12/04/2012  Findings: The lungs are clear without focal infiltrate, edema, pneumothorax or pleural effusion.  Hyperexpansion with attenuation of peripheral markings suggests emphysema. The  cardiopericardial silhouette is within normal limits for size. Bones are diffusely demineralized.  IMPRESSION: Stable.  No acute cardiopulmonary findings.   Original Report Authenticated By: Kennith Center, M.D.     Cardiac Studies: Telemetry shows sinus bradycardia. Assessment/Plan:  Recurrent chest pain and dyspnea prompting admission yesterday.  Options are nuclear stress test vs cath.  He notes that he had nuclear test a year ago that failed to demonstrate that he had widespread CAD.  He is concerned about present state of his coronaries and his recent stent. Will proceed with re-cath today.  Possibly home late today if cath is okay.  LOS: 1 day    Cassell Clement 01/17/2013, 7:43 AM

## 2013-01-18 ENCOUNTER — Encounter: Payer: Self-pay | Admitting: Cardiovascular Disease

## 2013-01-21 ENCOUNTER — Encounter (HOSPITAL_COMMUNITY)
Admission: RE | Admit: 2013-01-21 | Discharge: 2013-01-21 | Disposition: A | Discharge status: Home / Self Care | Payer: 59 | Source: Ambulatory Visit | Attending: Cardiovascular Disease | Admitting: Cardiovascular Disease

## 2013-01-23 ENCOUNTER — Encounter (HOSPITAL_COMMUNITY)
Admission: RE | Admit: 2013-01-23 | Discharge: 2013-01-23 | Disposition: A | Discharge status: Home / Self Care | Payer: 59 | Source: Ambulatory Visit | Attending: Cardiovascular Disease | Admitting: Cardiovascular Disease

## 2013-01-25 ENCOUNTER — Encounter (HOSPITAL_COMMUNITY)
Admission: RE | Admit: 2013-01-25 | Discharge: 2013-01-25 | Disposition: A | Discharge status: Home / Self Care | Payer: 59 | Source: Ambulatory Visit | Attending: Cardiovascular Disease | Admitting: Cardiovascular Disease

## 2013-01-28 ENCOUNTER — Encounter (HOSPITAL_COMMUNITY)
Admission: RE | Admit: 2013-01-28 | Discharge: 2013-01-28 | Disposition: A | Discharge status: Home / Self Care | Payer: 59 | Source: Ambulatory Visit | Attending: Cardiovascular Disease | Admitting: Cardiovascular Disease

## 2013-01-30 ENCOUNTER — Other Ambulatory Visit: Payer: Self-pay | Admitting: Cardiology

## 2013-01-30 ENCOUNTER — Encounter (HOSPITAL_COMMUNITY)
Admission: RE | Admit: 2013-01-30 | Discharge: 2013-01-30 | Disposition: A | Discharge status: Home / Self Care | Payer: 59 | Source: Ambulatory Visit | Attending: Cardiovascular Disease | Admitting: Cardiovascular Disease

## 2013-01-30 MED ORDER — SIMVASTATIN 40 MG PO TABS: 40.0000 mg | ORAL_TABLET | Freq: Every evening | ORAL | Status: DC

## 2013-02-01 ENCOUNTER — Encounter (HOSPITAL_COMMUNITY)
Admission: RE | Admit: 2013-02-01 | Discharge: 2013-02-01 | Disposition: A | Discharge status: Home / Self Care | Payer: 59 | Source: Ambulatory Visit | Attending: Cardiovascular Disease | Admitting: Cardiovascular Disease

## 2013-02-01 DIAGNOSIS — I252 Old myocardial infarction: Secondary | ICD-10-CM | POA: Insufficient documentation

## 2013-02-01 DIAGNOSIS — Z5189 Encounter for other specified aftercare: Secondary | ICD-10-CM | POA: Insufficient documentation

## 2013-02-01 DIAGNOSIS — Z9861 Coronary angioplasty status: Secondary | ICD-10-CM | POA: Insufficient documentation

## 2013-02-04 ENCOUNTER — Encounter (HOSPITAL_COMMUNITY): Payer: 59

## 2013-02-06 ENCOUNTER — Encounter (HOSPITAL_COMMUNITY): Payer: 59

## 2013-02-07 ENCOUNTER — Encounter: Payer: 59 | Admitting: Physician Assistant

## 2013-02-08 ENCOUNTER — Encounter (HOSPITAL_COMMUNITY): Payer: 59

## 2013-02-11 ENCOUNTER — Ambulatory Visit: Payer: 59 | Admitting: Cardiovascular Disease

## 2013-02-11 ENCOUNTER — Encounter (HOSPITAL_COMMUNITY): Payer: 59

## 2013-02-13 ENCOUNTER — Encounter (HOSPITAL_COMMUNITY): Payer: 59

## 2013-02-15 ENCOUNTER — Encounter (HOSPITAL_COMMUNITY): Payer: 59

## 2013-02-18 ENCOUNTER — Encounter (HOSPITAL_COMMUNITY): Payer: 59

## 2013-02-20 ENCOUNTER — Encounter (HOSPITAL_COMMUNITY): Payer: 59

## 2013-02-22 ENCOUNTER — Encounter (HOSPITAL_COMMUNITY): Payer: 59

## 2013-02-25 ENCOUNTER — Encounter (HOSPITAL_COMMUNITY): Payer: 59

## 2013-02-27 ENCOUNTER — Encounter (HOSPITAL_COMMUNITY): Payer: 59

## 2013-03-01 ENCOUNTER — Encounter (HOSPITAL_COMMUNITY): Payer: 59

## 2013-03-04 ENCOUNTER — Encounter (HOSPITAL_COMMUNITY): Payer: 59

## 2013-03-04 ENCOUNTER — Telehealth: Payer: Self-pay | Admitting: Cardiovascular Disease

## 2013-03-04 NOTE — Telephone Encounter (Signed)
Pt states [facility] does not have recent med dose change for Simvastatin. Notes reflect change made on 01/30/13. Called [facility] to call in correct Rx. Called patient back to inform him that [facility] has correct information now and that he can call in now for Rx delivery. Patient expressed appreciation for assistance.

## 2013-03-04 NOTE — Telephone Encounter (Signed)
New problem    Pt having a problem with getting his medication and need to speak to a nurse. Please call pt

## 2013-03-06 ENCOUNTER — Encounter (HOSPITAL_COMMUNITY): Payer: 59

## 2013-03-08 ENCOUNTER — Encounter (HOSPITAL_COMMUNITY): Payer: 59

## 2013-03-11 ENCOUNTER — Encounter (HOSPITAL_COMMUNITY): Payer: 59

## 2013-03-12 ENCOUNTER — Ambulatory Visit
Admission: RE | Admit: 2013-03-12 | Discharge: 2013-03-12 | Disposition: A | Discharge status: Home / Self Care | Payer: 59 | Source: Ambulatory Visit | Attending: Family Medicine | Admitting: Family Medicine

## 2013-03-12 ENCOUNTER — Other Ambulatory Visit: Payer: Self-pay | Admitting: Family Medicine

## 2013-03-12 DIAGNOSIS — M79609 Pain in unspecified limb: Secondary | ICD-10-CM

## 2013-03-13 ENCOUNTER — Encounter (HOSPITAL_COMMUNITY): Payer: 59

## 2013-03-15 ENCOUNTER — Encounter (HOSPITAL_COMMUNITY): Payer: 59

## 2013-03-18 ENCOUNTER — Encounter (HOSPITAL_COMMUNITY): Payer: 59

## 2013-03-20 ENCOUNTER — Encounter (HOSPITAL_COMMUNITY): Payer: 59

## 2013-03-22 ENCOUNTER — Encounter (HOSPITAL_COMMUNITY): Payer: 59

## 2013-03-25 ENCOUNTER — Encounter (HOSPITAL_COMMUNITY): Payer: 59

## 2013-03-26 ENCOUNTER — Ambulatory Visit (INDEPENDENT_AMBULATORY_CARE_PROVIDER_SITE_OTHER): Payer: 59 | Admitting: Cardiovascular Disease

## 2013-03-26 ENCOUNTER — Encounter: Payer: Self-pay | Admitting: Cardiovascular Disease

## 2013-03-26 VITALS — BP 111/73 | HR 92 | Ht 71.0 in | Wt 210.0 lb

## 2013-03-26 DIAGNOSIS — I2510 Atherosclerotic heart disease of native coronary artery without angina pectoris: Secondary | ICD-10-CM

## 2013-03-26 NOTE — Progress Notes (Signed)
History of Present Illness: 57 y.o. male with history of CAD, HTN, HLD, chronic neck and shoulder pain who is here today for cardiac follow up. He was admitted March 2014 with chest pain. LHC 12/05/12: Ostial LAD 20%, proximal LAD 50%, ostial OM1 30%, superior branch of OM2 ostial to proximal 90%, smaller inferior branch proximal 70-80%, non-dominant RCA with diffuse moderate to severe disease throughout the proximal two thirds. I performed PCI with placement of a Promus Premier DES in the superior branch of the OM2. Medical therapy recommended for the RCA which is a small nondominant vessel. Echocardiogram 12/04/12: Mild LVH, EF 60-65%, grade 1 diastolic dysfunction. Readmitted 01/16/13 with chest pain and repeat cath stable with patent stent.   He is here today for follow up.  The patient denies chest pain, shortness of breath, syncope, orthopnea, PND or significant pedal edema. He does have some fatigue. He has not tolerated beta blockers. He started cardiac rehab and did it for two weeks. He has not smoked since leaving the hospital. He has been trying to eat well.   Primary Care Physician: Tally Joe  Past Medical History  Diagnosis Date  . Renal disorder     renal calculi  . Stomach pain     chronic  . CAD (coronary artery disease)     a. DES OM March 2014.  Repeat cath 4/14 with 20% LAD, patent stent OM3, 70% diffuse stenosis small, non-dominant RCA  . HLD (hyperlipidemia)   . HTN (hypertension)     Past Surgical History  Procedure Laterality Date  . Cholecystectomy    . Back surgery      2 cervical/ 2 lumbar fusions  . Coronary stent placement      Current Outpatient Prescriptions  Medication Sig Dispense Refill  . aspirin EC 81 MG tablet Take 81 mg by mouth daily.      . clopidogrel (PLAVIX) 75 MG tablet Take 1 tablet (75 mg total) by mouth daily with breakfast.  30 tablet  12  . cyclobenzaprine (FLEXERIL) 10 MG tablet Take 10 mg by mouth 3 (three) times daily as needed for  muscle spasms.      Marland Kitchen dicyclomine (BENTYL) 20 MG tablet Take 20 mg by mouth 2 (two) times daily.       Marland Kitchen docusate sodium (COLACE) 100 MG capsule Take 100 mg by mouth 2 (two) times daily.       . hydrocortisone (ANUSOL-HC) 2.5 % rectal cream Place 1 application rectally as needed for hemorrhoids.      . mometasone (NASONEX) 50 MCG/ACT nasal spray Place 2 sprays into the nose at bedtime as needed (allergies).      . Multiple Vitamin (MULTIVITAMIN WITH MINERALS) TABS Take 1 tablet by mouth daily.      . nitroGLYCERIN (NITROSTAT) 0.4 MG SL tablet Place 1 tablet (0.4 mg total) under the tongue every 5 (five) minutes as needed for chest pain.  20 tablet  1  . olmesartan (BENICAR) 20 MG tablet Take 20 mg by mouth daily.      Marland Kitchen oxyCODONE-acetaminophen (PERCOCET/ROXICET) 5-325 MG per tablet Take 1 tablet by mouth every 4 (four) hours as needed for pain.      . pantoprazole (PROTONIX) 40 MG tablet Take 40 mg by mouth 2 (two) times daily.       . Probiotic Product (PROBIOTIC DAILY PO) Take 1 tablet by mouth daily.      Marland Kitchen senna (SENOKOT) 8.6 MG TABS Take 2 tablets by mouth at bedtime.       Marland Kitchen  simvastatin (ZOCOR) 40 MG tablet Take 1 tablet (40 mg total) by mouth every evening.  90 tablet  1   No current facility-administered medications for this visit.    Allergies  Allergen Reactions  . Latex Hives and Rash    History   Social History  . Marital Status: Married    Spouse Name: N/A    Number of Children: N/A  . Years of Education: N/A   Occupational History  . Not on file.   Social History Main Topics  . Smoking status: Former Smoker -- 0.50 packs/day    Types: Cigarettes    Start date: 12/24/1976    Quit date: 12/04/2012  . Smokeless tobacco: Not on file  . Alcohol Use: No  . Drug Use: No  . Sexually Active: Not on file   Other Topics Concern  . Not on file   Social History Narrative  . No narrative on file    Family History  Problem Relation Age of Onset  . Heart disease  Brother     Review of Systems:  As stated in the HPI and otherwise negative.   BP 111/73  Pulse 92  Ht 5\' 11"  (1.803 m)  Wt 210 lb (95.255 kg)  BMI 29.3 kg/m2  Physical Examination: General: Well developed, well nourished, NAD HEENT: OP clear, mucus membranes moist SKIN: warm, dry. No rashes. Neuro: No focal deficits Musculoskeletal: Muscle strength 5/5 all ext Psychiatric: Mood and affect normal Neck: No JVD, no carotid bruits, no thyromegaly, no lymphadenopathy. Lungs:Clear bilaterally, no wheezes, rhonci, crackles Cardiovascular: Regular rate and rhythm. No murmurs, gallops or rubs. Abdomen:Soft. Bowel sounds present. Non-tender.  Extremities: No lower extremity edema. Pulses are 2 + in the bilateral DP/PT.  Assessment and Plan:   1. CAD: Stable. NSTEMI in March 2014 with DES OM and repeat cath April 2014 with stable disease. Doing well at home. He is on good medical therapy. Will need dual anti-platelet therapy with ASA and Plavix for at least one year. He did not tolerate beta blockers. Will continue statin.

## 2013-03-26 NOTE — Patient Instructions (Addendum)
Your physician wants you to follow-up in:  6 months. You will receive a reminder letter in the mail two months in advance. If you don't receive a letter, please call our office to schedule the follow-up appointment.   

## 2013-03-27 ENCOUNTER — Encounter (HOSPITAL_COMMUNITY): Payer: 59

## 2013-03-29 ENCOUNTER — Encounter (HOSPITAL_COMMUNITY): Payer: 59

## 2013-04-01 ENCOUNTER — Encounter (HOSPITAL_COMMUNITY): Payer: 59

## 2013-04-03 ENCOUNTER — Encounter (HOSPITAL_COMMUNITY): Payer: 59

## 2013-04-05 ENCOUNTER — Encounter (HOSPITAL_COMMUNITY): Payer: 59

## 2013-04-08 ENCOUNTER — Encounter (HOSPITAL_COMMUNITY): Payer: 59

## 2013-04-10 ENCOUNTER — Encounter (HOSPITAL_COMMUNITY): Payer: 59

## 2013-04-12 ENCOUNTER — Encounter (HOSPITAL_COMMUNITY): Payer: 59

## 2013-05-17 ENCOUNTER — Other Ambulatory Visit: Payer: Self-pay | Admitting: Cardiovascular Disease

## 2013-05-28 ENCOUNTER — Encounter (HOSPITAL_COMMUNITY): Payer: Self-pay | Admitting: *Deleted

## 2013-05-28 ENCOUNTER — Emergency Department (HOSPITAL_COMMUNITY)
Admission: EM | Admit: 2013-05-28 | Discharge: 2013-05-29 | Disposition: A | Discharge status: Home / Self Care | Payer: 59 | Attending: Emergency Medicine | Admitting: Emergency Medicine

## 2013-05-28 DIAGNOSIS — Z87891 Personal history of nicotine dependence: Secondary | ICD-10-CM | POA: Insufficient documentation

## 2013-05-28 DIAGNOSIS — R1031 Right lower quadrant pain: Secondary | ICD-10-CM | POA: Insufficient documentation

## 2013-05-28 DIAGNOSIS — R319 Hematuria, unspecified: Secondary | ICD-10-CM | POA: Insufficient documentation

## 2013-05-28 DIAGNOSIS — I10 Essential (primary) hypertension: Secondary | ICD-10-CM | POA: Insufficient documentation

## 2013-05-28 DIAGNOSIS — Z79899 Other long term (current) drug therapy: Secondary | ICD-10-CM | POA: Insufficient documentation

## 2013-05-28 DIAGNOSIS — N509 Disorder of male genital organs, unspecified: Secondary | ICD-10-CM | POA: Insufficient documentation

## 2013-05-28 DIAGNOSIS — R110 Nausea: Secondary | ICD-10-CM | POA: Insufficient documentation

## 2013-05-28 DIAGNOSIS — R109 Unspecified abdominal pain: Secondary | ICD-10-CM

## 2013-05-28 DIAGNOSIS — Z87448 Personal history of other diseases of urinary system: Secondary | ICD-10-CM | POA: Insufficient documentation

## 2013-05-28 DIAGNOSIS — E785 Hyperlipidemia, unspecified: Secondary | ICD-10-CM | POA: Insufficient documentation

## 2013-05-28 DIAGNOSIS — Z91040 Latex allergy status: Secondary | ICD-10-CM | POA: Insufficient documentation

## 2013-05-28 DIAGNOSIS — Z9861 Coronary angioplasty status: Secondary | ICD-10-CM | POA: Insufficient documentation

## 2013-05-28 DIAGNOSIS — I2510 Atherosclerotic heart disease of native coronary artery without angina pectoris: Secondary | ICD-10-CM | POA: Insufficient documentation

## 2013-05-28 DIAGNOSIS — Z7902 Long term (current) use of antithrombotics/antiplatelets: Secondary | ICD-10-CM | POA: Insufficient documentation

## 2013-05-28 DIAGNOSIS — R300 Dysuria: Secondary | ICD-10-CM | POA: Insufficient documentation

## 2013-05-28 DIAGNOSIS — Z7982 Long term (current) use of aspirin: Secondary | ICD-10-CM | POA: Insufficient documentation

## 2013-05-28 LAB — URINALYSIS, ROUTINE W REFLEX MICROSCOPIC
Bilirubin Urine: NEGATIVE
Ketones, ur: NEGATIVE mg/dL
Leukocytes, UA: NEGATIVE
Nitrite: NEGATIVE
Protein, ur: NEGATIVE mg/dL
Urobilinogen, UA: 0.2 mg/dL (ref 0.0–1.0)
pH: 5.5 (ref 5.0–8.0)

## 2013-05-28 MED ORDER — ONDANSETRON HCL 4 MG/2ML IJ SOLN
4.0000 mg | Freq: Once | INTRAMUSCULAR | Status: AC
  Administered 2013-05-29: 4 mg via INTRAVENOUS
  Filled 2013-05-28: qty 2

## 2013-05-28 MED ORDER — FENTANYL CITRATE 0.05 MG/ML IJ SOLN
50.0000 ug | Freq: Once | INTRAMUSCULAR | Status: AC
  Administered 2013-05-29: 50 ug via INTRAVENOUS
  Filled 2013-05-28: qty 2

## 2013-05-28 NOTE — ED Notes (Addendum)
Pt reporting pain on right side, lower quadrant pain.  Pt states that he has long history of kidney stones, and pain is same.  Reports pain began about 1 hour ago.  Pt reports he began drinking water and took one percocet and flexeril about an hour ago when pain began.  No relief.

## 2013-05-29 LAB — BASIC METABOLIC PANEL
BUN: 15 mg/dL (ref 6–23)
CO2: 29 mEq/L (ref 19–32)
Calcium: 9.9 mg/dL (ref 8.4–10.5)
Chloride: 101 mEq/L (ref 96–112)
Creatinine, Ser: 1.48 mg/dL — ABNORMAL HIGH (ref 0.50–1.35)
Glucose, Bld: 129 mg/dL — ABNORMAL HIGH (ref 70–99)

## 2013-05-29 LAB — CBC WITH DIFFERENTIAL/PLATELET
Basophils Absolute: 0 10*3/uL (ref 0.0–0.1)
Eosinophils Absolute: 0.1 10*3/uL (ref 0.0–0.7)
Eosinophils Relative: 2 % (ref 0–5)
HCT: 41.9 % (ref 39.0–52.0)
MCH: 30.1 pg (ref 26.0–34.0)
MCV: 88.8 fL (ref 78.0–100.0)
Monocytes Absolute: 0.4 10*3/uL (ref 0.1–1.0)
Platelets: 160 10*3/uL (ref 150–400)
RDW: 13.1 % (ref 11.5–15.5)

## 2013-05-29 MED ORDER — ONDANSETRON HCL 8 MG PO TABS: 8.0000 mg | ORAL_TABLET | Freq: Three times a day (TID) | ORAL | Status: DC | PRN

## 2013-05-29 MED ORDER — DIPHENHYDRAMINE HCL 50 MG/ML IJ SOLN
25.0000 mg | Freq: Once | INTRAMUSCULAR | Status: AC
  Administered 2013-05-29: 25 mg via INTRAVENOUS
  Filled 2013-05-29: qty 1

## 2013-05-29 MED ORDER — HYDROMORPHONE HCL PF 1 MG/ML IJ SOLN
1.0000 mg | Freq: Once | INTRAMUSCULAR | Status: AC
  Administered 2013-05-29: 1 mg via INTRAVENOUS
  Filled 2013-05-29: qty 1

## 2013-05-29 MED ORDER — SODIUM CHLORIDE 0.9 % IV BOLUS (SEPSIS)
1000.0000 mL | Freq: Once | INTRAVENOUS | Status: AC
  Administered 2013-05-29: 1000 mL via INTRAVENOUS

## 2013-05-29 NOTE — ED Notes (Signed)
C/o generalized itching - no hives - states has experienced itching in past after receiving pain medication.  No breathing difficulties

## 2013-05-29 NOTE — ED Provider Notes (Signed)
CSN: [id number]     Arrival date & time 05/28/13  2324 History   First MD Initiated Contact with Patient 05/28/13 2348   Chief Complaint - abdominal pain   Patient is a 57 y.o. male presenting with abdominal pain. The history is provided by the patient.  Abdominal Pain Pain location:  RLQ Pain quality: sharp   Pain radiates to:  Groin Pain severity:  Severe Onset quality:  Sudden Duration: just prior to arrival. Timing:  Constant Progression:  Worsening Chronicity:  Recurrent Relieved by:  Nothing Worsened by:  Nothing tried Ineffective treatments: home pain meds. Associated symptoms: dysuria and nausea   Associated symptoms: no chest pain, no fever, no shortness of breath and no vomiting     Past Medical History  Diagnosis Date  . Renal disorder     renal calculi  . Stomach pain     chronic  . CAD (coronary artery disease)     a. DES OM March 2014.  Repeat cath 4/14 with 20% LAD, patent stent OM3, 70% diffuse stenosis small, non-dominant RCA  . HLD (hyperlipidemia)   . HTN (hypertension)    Past Surgical History  Procedure Laterality Date  . Cholecystectomy    . Back surgery      2 cervical/ 2 lumbar fusions  . Coronary stent placement     Family History  Problem Relation Age of Onset  . Heart disease Brother    History  Substance Use Topics  . Smoking status: Former Smoker -- 0.50 packs/day    Types: Cigarettes    Start date: 12/24/1976    Quit date: 12/04/2012  . Smokeless tobacco: Not on file  . Alcohol Use: No    Review of Systems  Constitutional: Negative for fever.  Respiratory: Negative for shortness of breath.   Cardiovascular: Negative for chest pain.  Gastrointestinal: Positive for nausea and abdominal pain. Negative for vomiting.  Genitourinary: Positive for dysuria and testicular pain. Negative for flank pain.  Neurological: Negative for weakness.  All other systems reviewed and are negative.    Allergies  Latex  Home Medications    Current Outpatient Rx  Name  Route  Sig  Dispense  Refill  . aspirin EC 81 MG tablet   Oral   Take 81 mg by mouth daily.         . clopidogrel (PLAVIX) 75 MG tablet   Oral   Take 1 tablet (75 mg total) by mouth daily with breakfast.   30 tablet   12   . cyclobenzaprine (FLEXERIL) 10 MG tablet   Oral   Take 10 mg by mouth 3 (three) times daily as needed for muscle spasms.         Marland Kitchen dicyclomine (BENTYL) 20 MG tablet   Oral   Take 20 mg by mouth 2 (two) times daily.          Marland Kitchen docusate sodium (COLACE) 100 MG capsule   Oral   Take 100 mg by mouth 2 (two) times daily.          . hydrocortisone (ANUSOL-HC) 2.5 % rectal cream   Rectal   Place 1 application rectally as needed for hemorrhoids.         . mometasone (NASONEX) 50 MCG/ACT nasal spray   Nasal   Place 2 sprays into the nose at bedtime as needed (allergies).         . Multiple Vitamin (MULTIVITAMIN WITH MINERALS) TABS   Oral   Take 1 tablet by  mouth daily.         . nitroGLYCERIN (NITROSTAT) 0.4 MG SL tablet   Sublingual   Place 1 tablet (0.4 mg total) under the tongue every 5 (five) minutes as needed for chest pain.   20 tablet   1   . olmesartan (BENICAR) 20 MG tablet   Oral   Take 20 mg by mouth daily.         Marland Kitchen oxyCODONE-acetaminophen (PERCOCET/ROXICET) 5-325 MG per tablet   Oral   Take 1 tablet by mouth every 4 (four) hours as needed for pain.         . pantoprazole (PROTONIX) 40 MG tablet   Oral   Take 40 mg by mouth 2 (two) times daily.          . Probiotic Product (PROBIOTIC DAILY PO)   Oral   Take 1 tablet by mouth daily.         Marland Kitchen senna (SENOKOT) 8.6 MG TABS   Oral   Take 2 tablets by mouth at bedtime.          . simvastatin (ZOCOR) 40 MG tablet      Take 1 tablet by mouth  every evening   30 tablet   6    BP 162/116  Pulse 70  Temp(Src) 98.5 F (36.9 C) (Oral)  Resp 24  Ht 5\' 11"  (1.803 m)  Wt 208 lb (94.348 kg)  BMI 29.02 kg/m2  SpO2 99% Physical  Exam CONSTITUTIONAL: Well developed/well nourished, uncomfortable appearing HEAD: Normocephalic/atraumatic EYES: EOMI/PERRL ENMT: Mucous membranes moist NECK: supple no meningeal signs SPINE:entire spine nontender CV: S1/S2 noted, no murmurs/rubs/gallops noted LUNGS: Lungs are clear to auscultation bilaterally, no apparent distress ABDOMEN: soft, mild RLQ tenderness, no rebound or guarding GU:no cva tenderness, no scrotal tenderness, no hernia noted, chaperone present NEURO: Pt is awake/alert, moves all extremitiesx4 EXTREMITIES: pulses normal, full ROM SKIN: warm, color normal PSYCH: no abnormalities of mood noted  ED Course  Procedures    12:57 AM Pt presents for abdominal pain which he feels is similar to prior ureteral stones He has long h/o kidney stones but has never required intervention He has had imaging in system which documents kidney stones Will treat pain and reassess Labs Review 1:50 AM Pt had mild itching after dilaudid, but no sob/wheeze/angioedema/rash.  He reports this is common after taking pain meds He reports his pain is nearly resolved.  He reports mild ache in low abdomen/groin but mostly resolved We discussed that at this point since his pain is improving, he has h/o stones with similar presentations, acute imaging not warranted.  He is agreeable with this plan We discussed strict return precautions, and urology referral given  Labs Reviewed  URINALYSIS, ROUTINE W REFLEX MICROSCOPIC - Abnormal; Notable for the following:    Specific Gravity, Urine >1.030 (*)    Hgb urine dipstick LARGE (*)    All other components within normal limits  BASIC METABOLIC PANEL - Abnormal; Notable for the following:    Glucose, Bld 129 (*)    Creatinine, Ser 1.48 (*)    GFR calc non Af Amer 50 (*)    GFR calc Af Amer 58 (*)    All other components within normal limits  URINE MICROSCOPIC-ADD ON  CBC WITH DIFFERENTIAL   Imaging Review No results found.  MDM    Nursing notes including past medical history and social history reviewed and considered in documentation Previous records reviewed and considered - previous CT imaging reveals nephrolithiasis,  no AAA Labs/vital reviewed and considered     Joya Gaskins, MD 05/29/13 [phone]

## 2013-07-03 ENCOUNTER — Telehealth: Payer: Self-pay | Admitting: Cardiovascular Disease

## 2013-07-03 NOTE — Telephone Encounter (Signed)
Spoke with Pam. They are requesting cardiac clearance for pt to have cystoscopy, extraction of bladder stone and meatal dilation. Procedure can be done on Plavix. Requesting recommendation regarding ASA. MD is Dr. Mena Goes. Procedure has not been scheduled yet--pending cardiac clearance. Per Pam there is no form to be completed and this phone note would be sufficient to address clearance.

## 2013-07-03 NOTE — Telephone Encounter (Signed)
OK to hold ASA for procedure if he can continue Plavix. Thanks, chris

## 2013-07-03 NOTE — Telephone Encounter (Signed)
New problem    Need cardiac clearance- due to bladder stone.

## 2013-07-03 NOTE — Telephone Encounter (Signed)
Left message for Pam at [facility] to call back

## 2013-07-05 NOTE — Telephone Encounter (Signed)
Will fax this note to [facility] at [facility]

## 2013-07-05 NOTE — Telephone Encounter (Signed)
I have spoken to Mr. Febo. He is doing well from a cardiac standpoint. No chest pain or SOB. Cardiac disease is stable. OK to hold ASA prior to procedure. Would not stop Plavix before procedure with recent coronary stent placement. OK to proceed with urological procedure.  Earney Hamburg

## 2013-07-08 ENCOUNTER — Other Ambulatory Visit: Payer: Self-pay | Admitting: Urology

## 2013-07-10 ENCOUNTER — Encounter (HOSPITAL_COMMUNITY): Payer: Self-pay | Admitting: Pharmacy Technician

## 2013-07-11 ENCOUNTER — Encounter (HOSPITAL_COMMUNITY)
Admission: RE | Admit: 2013-07-11 | Discharge: 2013-07-11 | Disposition: A | Discharge status: Home / Self Care | Payer: 59 | Source: Ambulatory Visit | Attending: Urology | Admitting: Urology

## 2013-07-11 ENCOUNTER — Encounter (HOSPITAL_COMMUNITY): Payer: Self-pay

## 2013-07-11 DIAGNOSIS — R109 Unspecified abdominal pain: Secondary | ICD-10-CM

## 2013-07-11 DIAGNOSIS — N210 Calculus in bladder: Secondary | ICD-10-CM

## 2013-07-11 DIAGNOSIS — Z01812 Encounter for preprocedural laboratory examination: Secondary | ICD-10-CM | POA: Insufficient documentation

## 2013-07-11 HISTORY — DX: Calculus in bladder: N21.0

## 2013-07-11 HISTORY — DX: Unspecified osteoarthritis, unspecified site: M19.90

## 2013-07-11 HISTORY — DX: Unspecified abdominal pain: R10.9

## 2013-07-11 LAB — CBC
Hemoglobin: 14.2 g/dL (ref 13.0–17.0)
MCH: 29.4 pg (ref 26.0–34.0)
MCHC: 33.4 g/dL (ref 30.0–36.0)
Platelets: 131 10*3/uL — ABNORMAL LOW (ref 150–400)
RBC: 4.83 MIL/uL (ref 4.22–5.81)
WBC: 4.2 10*3/uL (ref 4.0–10.5)

## 2013-07-11 LAB — BASIC METABOLIC PANEL
CO2: 26 mEq/L (ref 19–32)
Calcium: 9.5 mg/dL (ref 8.4–10.5)
Chloride: 101 mEq/L (ref 96–112)
GFR calc Af Amer: 78 mL/min — ABNORMAL LOW (ref >90)
Glucose, Bld: 90 mg/dL (ref 70–99)
Potassium: 3.8 mEq/L (ref 3.5–5.1)
Sodium: 136 mEq/L (ref 135–145)

## 2013-07-11 NOTE — Pre-Procedure Instructions (Addendum)
07-11-13 EKG/ CXR 4'14-Epic 07-11-13 1215 Spoke with Chasity Little of Dr. Mena Goes office. Pt. Concerned whether surgery needed "felt he has passed stone"" not had any issues with urination since. Chasity to inform Dr. Mena Goes will call pt. Of any further decisions or if surgery cancelled. W.Tysheena Ginzburg,RN

## 2013-07-11 NOTE — Patient Instructions (Addendum)
20 CASTLE LAMONS  07/11/2013   Your procedure is scheduled on:  10-14 -2014  Report to [facility] at        1000 AM .  Call this number if you have problems the morning of surgery: [phone]  Or Presurgical Testing [phone](Job Holtsclaw)   Remember: Follow any bowel prep instructions per MD office.    Do not eat food:After Midnight.    Take these medicines the morning of surgery with A SIP OF WATER: Plavix(as told) . Pantoprazole.   Do not wear jewelry, make-up or nail polish.  Do not wear lotions, powders, or perfumes. You may wear deodorant.  Do not shave 12 hours prior to first CHG shower(legs and under arms).(face and neck okay.)  Do not bring valuables to the hospital.  Contacts, dentures or bridgework,body piercing,  may not be worn into surgery.  Leave suitcase in the car. After surgery it may be brought to your room.  For patients admitted to the hospital, checkout time is 11:00 AM the day of discharge.   Patients discharged the day of surgery will not be allowed to drive home. Must have responsible person with you x 24 hours once discharged.  Name and phone number of your driver: Venita Sheffield -spouse [phone] cell  Special Instructions: CHG(Chlorhedine 4%-"Hibiclens","Betasept","Aplicare") Shower Use Special Wash: see special instructions.(avoid face and genitals)       Failure to follow these instructions may result in Cancellation of your surgery.   Patient signature_______________________________________________________

## 2013-07-16 ENCOUNTER — Ambulatory Visit (HOSPITAL_COMMUNITY): Admission: RE | Admit: 2013-07-16 | Payer: 59 | Source: Ambulatory Visit | Admitting: Urology

## 2013-07-16 ENCOUNTER — Encounter (HOSPITAL_COMMUNITY): Admission: RE | Payer: Self-pay | Source: Ambulatory Visit

## 2013-07-16 SURGERY — CYSTOSCOPY, WITH CALCULUS MANIPULATION OR REMOVAL
Anesthesia: General

## 2013-09-10 NOTE — Addendum Note (Signed)
Encounter addended by: Angelica Pou, RN on: 09/10/2013 10:46 AM<BR>     Documentation filed: Clinical Notes

## 2013-09-10 NOTE — Addendum Note (Signed)
Encounter addended by: Angelica Pou, RN on: 09/10/2013 10:45 AM<BR>     Documentation filed: Notes Section

## 2013-09-10 NOTE — Progress Notes (Signed)
Cardiac Rehabilitation Program Outcomes Report   Orientation:  01/10/2013 Graduate Date:NA Discharge Date:  02/01/2013 # of sessions completed: 6 YN:WGNFA X 1 and Balloon Angioplasty  Cardiologist: Clifton James Family MD:  Towanda Octave Time:  11:00  A.  Exercise Program:  Tolerates exercise @ 3.85 METS for 15 minutes, Walk Test Results:  Pre: Pre walk Test: Resting hr 49, BP 130/80, O2100%RPE 6 and RPD 6, 6 minute Hr 75 BP 158/92 O2 98%, RPE9 and RPD 6, Post Hr 51 BP 130/84 O2 100% RPE 6, And RPD 6, Walked 1732 ft at 3.3 mph at 3.5 METS. and Discharged  B.  Mental Health:  Good mental attitude and Quality of Life (QOL)  improvements:  Overall  25.26 %, Health/Functioning 22.40 %, Socioeconomics 28.21 %, Psych/Spiritual 27.43 %, Family 27.00 %    C.  Education/Instruction/Skills  Accurately checks own pulse.  Rest:  70  Exercise:  101, Knows THR for exercise, Uses Perceived Exertion Scale and/or Dyspnea Scale and Attended 2 education classes  Uses Perceived Exertion Scale and/or Dyspnea Scale  D.  Nutrition/Weight Control/Body Composition:  Adherence to prescribed nutrition program: good    E.  Blood Lipids    No results found for this basename: CHOL, HDL, LDLCALC, LDLDIRECT, TRIG, CHOLHDL    F.  Lifestyle Changes:  Making positive lifestyle changes and Not smoking:  Quit 12/2012  G.  Symptoms noted with exercise:  Asymptomatic  Report Completed By:  Lelon Huh. Kyren Knick RN   Comments:  This is patients discharge report . He achieved a peak METS of 3.85. He only attended 6 sessions His Resting Hr was 70 and BP was 120/60. His peak HR was 101 and BP 160/80. He never returned to finish program.

## 2013-09-20 ENCOUNTER — Ambulatory Visit (INDEPENDENT_AMBULATORY_CARE_PROVIDER_SITE_OTHER): Payer: 59 | Admitting: Cardiovascular Disease

## 2013-09-20 ENCOUNTER — Encounter: Payer: Self-pay | Admitting: Cardiovascular Disease

## 2013-09-20 VITALS — BP 125/65 | HR 72 | Ht 71.0 in | Wt 225.0 lb

## 2013-09-20 DIAGNOSIS — F17201 Nicotine dependence, unspecified, in remission: Secondary | ICD-10-CM

## 2013-09-20 DIAGNOSIS — Z87891 Personal history of nicotine dependence: Secondary | ICD-10-CM

## 2013-09-20 DIAGNOSIS — I10 Essential (primary) hypertension: Secondary | ICD-10-CM

## 2013-09-20 DIAGNOSIS — I2510 Atherosclerotic heart disease of native coronary artery without angina pectoris: Secondary | ICD-10-CM

## 2013-09-20 DIAGNOSIS — E785 Hyperlipidemia, unspecified: Secondary | ICD-10-CM

## 2013-09-20 MED ORDER — CLOPIDOGREL BISULFATE 75 MG PO TABS: 75.0000 mg | ORAL_TABLET | Freq: Every morning | ORAL | Status: DC

## 2013-09-20 NOTE — Progress Notes (Signed)
History of Present Illness: 57 y.o. male with history of CAD, HTN, HLD, chronic neck and shoulder pain who is here today for cardiac follow up. He was admitted March 2014 with chest pain. LHC 12/05/12: Ostial LAD 20%, proximal LAD 50%, ostial OM1 30%, superior branch of OM2 ostial to proximal 90%, smaller inferior branch proximal 70-80%, non-dominant RCA with diffuse moderate to severe disease throughout the proximal two thirds. I performed PCI with placement of a Promus Premier DES in the superior branch of the OM2. Medical therapy recommended for the RCA which is a small nondominant vessel. Echocardiogram 12/04/12: Mild LVH, EF 60-65%, grade 1 diastolic dysfunction. Readmitted 01/16/13 with chest pain and repeat cath stable with patent stent.   He is here today for follow up.  The patient denies chest pain, shortness of breath, syncope, orthopnea, PND or significant pedal edema. He does have some fatigue. He has not tolerated beta blockers.   Primary Care Physician: Tally Joe  Lipid profile: Followed in primary care. Most recent LDL 105, Total chol 181  Past Medical History  Diagnosis Date  . Renal disorder     renal calculi  . Stomach pain     chronic  . CAD (coronary artery disease)     a. DES OM March 2014.  Repeat cath 4/14 with 20% LAD, patent stent OM3, 70% diffuse stenosis small, non-dominant RCA  . HLD (hyperlipidemia)   . HTN (hypertension)   . Abdominal pain 07-11-13    ongoing and unspecified  . Arthritis   . Shortness of breath     occ. and ? related to med. ? relalted to reflux.  . Bladder stone 07-11-13    surgery planned    Past Surgical History  Procedure Laterality Date  . Back surgery      2 cervical/ 2 lumbar fusions  . Coronary stent placement      3'14  . Knee arthroscopy Left     '80's  . Cholecystectomy      laparoscopic.  Marland Kitchen Esophagogastroduodenoscopy endoscopy      multiple times-[facility]/ and now being followed by Endo- [facility]    Current  Outpatient Prescriptions  Medication Sig Dispense Refill  . acetaminophen (TYLENOL) 500 MG tablet Take 500 mg by mouth every 6 (six) hours as needed for pain.      Marland Kitchen aspirin EC 81 MG tablet Take 81 mg by mouth daily.      . clopidogrel (PLAVIX) 75 MG tablet Take 75 mg by mouth every morning.      . dicyclomine (BENTYL) 20 MG tablet Take 20 mg by mouth 4 (four) times daily as needed (IBS).       Marland Kitchen diphenhydramine-acetaminophen (TYLENOL PM) 25-500 MG TABS Take 1 tablet by mouth at bedtime as needed (SLEEP).      Marland Kitchen docusate sodium (COLACE) 100 MG capsule Take 100 mg by mouth 2 (two) times daily.       . hydrocortisone (ANUSOL-HC) 2.5 % rectal cream Place 1 application rectally as needed for hemorrhoids.      . Multiple Vitamin (MULTIVITAMIN WITH MINERALS) TABS Take 1 tablet by mouth daily.      . nitroGLYCERIN (NITROSTAT) 0.4 MG SL tablet Place 1 tablet (0.4 mg total) under the tongue every 5 (five) minutes as needed for chest pain.  20 tablet  1  . olmesartan (BENICAR) 20 MG tablet Take 20 mg by mouth every morning.       Marland Kitchen oxyCODONE-acetaminophen (PERCOCET/ROXICET) 5-325 MG per tablet Take  1 tablet by mouth every 4 (four) hours as needed for pain.      . pantoprazole (PROTONIX) 40 MG tablet Take 40 mg by mouth 2 (two) times daily.       . Probiotic Product (PROBIOTIC DAILY PO) Take 1 tablet by mouth daily.      Marland Kitchen senna (SENOKOT) 8.6 MG TABS Take 2 tablets by mouth at bedtime.       . simvastatin (ZOCOR) 40 MG tablet Take 1 tablet by mouth  every evening  30 tablet  6  . tamsulosin (FLOMAX) 0.4 MG CAPS capsule Take 0.4 mg by mouth daily after supper.       No current facility-administered medications for this visit.    Allergies  Allergen Reactions  . Latex Hives and Rash    History   Social History  . Marital Status: Married    Spouse Name: N/A    Number of Children: N/A  . Years of Education: N/A   Occupational History  . Not on file.   Social History Main Topics  . Smoking  status: Former Smoker -- 0.50 packs/day    Types: Cigarettes    Start date: 12/24/1976    Quit date: 12/04/2012  . Smokeless tobacco: Not on file  . Alcohol Use: No  . Drug Use: No  . Sexual Activity: Not on file   Other Topics Concern  . Not on file   Social History Narrative  . No narrative on file    Family History  Problem Relation Age of Onset  . Heart disease Brother     Review of Systems:  As stated in the HPI and otherwise negative.   BP 125/65  Pulse 72  Ht 5\' 11"  (1.803 m)  Wt 225 lb (102.059 kg)  BMI 31.39 kg/m2  Physical Examination: General: Well developed, well nourished, NAD HEENT: OP clear, mucus membranes moist SKIN: warm, dry. No rashes. Neuro: No focal deficits Musculoskeletal: Muscle strength 5/5 all ext Psychiatric: Mood and affect normal Neck: No JVD, no carotid bruits, no thyromegaly, no lymphadenopathy. Lungs:Clear bilaterally, no wheezes, rhonci, crackles Cardiovascular: Regular rate and rhythm. No murmurs, gallops or rubs. Abdomen:Soft. Bowel sounds present. Non-tender.  Extremities: No lower extremity edema. Pulses are 2 + in the bilateral DP/PT.  Assessment and Plan:   1. CAD: Stable. NSTEMI in March 2014 with DES OM and repeat cath April 2014 with stable disease. Doing well at home. He is on good medical therapy. Will need dual anti-platelet therapy with ASA and Plavix for lifetime. He did not tolerate beta blockers. Will continue statin.   2. HTN: BP controlled. No changes.   3. HLD: Continue statin. Lipids followed in primary care.   4. 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

## 2013-09-20 NOTE — Patient Instructions (Signed)
Your physician wants you to follow-up in:  6 months. You will receive a reminder letter in the mail two months in advance. If you don't receive a letter, please call our office to schedule the follow-up appointment.   

## 2013-10-16 DIAGNOSIS — R0789 Other chest pain: Secondary | ICD-10-CM | POA: Insufficient documentation

## 2013-10-25 ENCOUNTER — Telehealth: Payer: Self-pay | Admitting: *Deleted

## 2013-10-25 NOTE — Telephone Encounter (Signed)
Received office note from Dr. Jacquiline Doe with note stating she would contact his cardiologist to determine management of his ASA and Plavix peri-procedurally. Plan is for EGD with esophageal biopsies.  Discussed with Dr. Angelena Form and pt cannot stop ASA or Plavix prior to April 2015. I placed call to pt to discuss. Left message to call back

## 2013-10-28 MED ORDER — NITROGLYCERIN 0.4 MG SL SUBL: 0.4000 mg | SUBLINGUAL_TABLET | SUBLINGUAL | Status: DC | PRN

## 2013-10-28 NOTE — Telephone Encounter (Signed)
I spoke with pt and told him Dr. Angelena Form does not want him to stop ASA or Plavix prior to April. He would like me to call Dr. Albina Billet office with this information--office number [phone]. Pt states he has been dealing with this problem for approximately 5 years and can wait until April for EGD. He will contact us prior to April with update on how he is feeling before stopping medications for procedure. He reports one episode of chest pain about 2 weeks ago that occurred after he had been working on a room addition.  He took 1 NTG with relief.  I have instructed him to monitor these symptoms and let us know if any increase.  Will send NTG refill to [facility] in [place].  I called and spoke with Gerald Stabs (RN for Dr. Denice Paradise) and gave him information regarding ASA and Plavix.  I have asked him to contact our office prior to procedure for instructions regarding medications.

## 2013-10-28 NOTE — Telephone Encounter (Signed)
Ne message    Returning a nurses call.

## 2013-11-29 ENCOUNTER — Ambulatory Visit (HOSPITAL_COMMUNITY)
Admission: RE | Admit: 2013-11-29 | Discharge: 2013-11-29 | Disposition: A | Discharge status: Home / Self Care | Payer: 59 | Source: Ambulatory Visit | Attending: Family Medicine | Admitting: Family Medicine

## 2013-11-29 ENCOUNTER — Ambulatory Visit (HOSPITAL_COMMUNITY): Payer: 59 | Admitting: Specialist

## 2013-11-29 DIAGNOSIS — I2510 Atherosclerotic heart disease of native coronary artery without angina pectoris: Secondary | ICD-10-CM | POA: Insufficient documentation

## 2013-11-29 DIAGNOSIS — K219 Gastro-esophageal reflux disease without esophagitis: Secondary | ICD-10-CM | POA: Insufficient documentation

## 2013-11-29 DIAGNOSIS — I10 Essential (primary) hypertension: Secondary | ICD-10-CM | POA: Insufficient documentation

## 2013-11-29 DIAGNOSIS — E785 Hyperlipidemia, unspecified: Secondary | ICD-10-CM | POA: Insufficient documentation

## 2013-11-29 DIAGNOSIS — R109 Unspecified abdominal pain: Secondary | ICD-10-CM | POA: Insufficient documentation

## 2013-11-29 DIAGNOSIS — M25522 Pain in left elbow: Secondary | ICD-10-CM | POA: Insufficient documentation

## 2013-11-29 DIAGNOSIS — G8929 Other chronic pain: Secondary | ICD-10-CM | POA: Insufficient documentation

## 2013-11-29 DIAGNOSIS — Z9861 Coronary angioplasty status: Secondary | ICD-10-CM | POA: Insufficient documentation

## 2013-11-29 DIAGNOSIS — M25529 Pain in unspecified elbow: Secondary | ICD-10-CM | POA: Insufficient documentation

## 2013-11-29 DIAGNOSIS — IMO0001 Reserved for inherently not codable concepts without codable children: Secondary | ICD-10-CM | POA: Insufficient documentation

## 2013-11-29 NOTE — Evaluation (Signed)
Occupational Therapy Evaluation  Patient Details  Name: Russell Padilla MRN: [id number] Date of Birth: September 24, 1956  Today's Date: 11/29/2013 Time: [id number] OT Time Calculation (min): 40 min OT Eval [id number] 20' Manual therapy 1325-1345 20'  Visit#: 1 of 12  Re-eval: 12/27/13  Assessment Diagnosis: Left Elbow Pain Next MD Visit: unknown Prior Therapy: n/a  Authorization: [employer]  Authorization Time Period:    Authorization Visit#:   of     Past Medical History:  Past Medical History  Diagnosis Date  . Renal disorder     renal calculi  . Stomach pain     chronic  . CAD (coronary artery disease)     a. DES OM March 2014.  Repeat cath 4/14 with 20% LAD, patent stent OM3, 70% diffuse stenosis small, non-dominant RCA  . HLD (hyperlipidemia)   . HTN (hypertension)   . Abdominal pain 07-11-13    ongoing and unspecified  . Arthritis   . Shortness of breath     occ. and ? related to med. ? relalted to reflux.  . Bladder stone 07-11-13    surgery planned   Past Surgical History:  Past Surgical History  Procedure Laterality Date  . Back surgery      2 cervical/ 2 lumbar fusions  . Coronary stent placement      3'14  . Knee arthroscopy Left     '80's  . Cholecystectomy      laparoscopic.  Marland Kitchen Esophagogastroduodenoscopy endoscopy      multiple times-[facility]/ and now being followed by Endo- MD [facility]    Subjective S:  I got a bruise a few months ago and then I started having pain in the inside of my elbow  Pertinent History: Mr. Conway is on Plavix.  He states he got a bruise near the medial epicondyle of his left elbow and has had extreme pain in the region since this time.  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  He consulted with Dr. Drema Dallas and recieved a cortisone injection which did not alleviate his pain.  He has been referred to occupational therapy for evaluation and treatment.  Limitations: progress as  tolerated Special Tests: FOTO scored 50/100. Patient Stated Goals: I want my elbow to stop hurtin. Pain Assessment Currently in Pain?: Yes Pain Score: 10-Worst pain ever Pain Location: Elbow Pain Orientation: Left;Medial Pain Type: Acute pain Pain Radiating Towards: radiates along ulnar nerve path.  Pain is 0/10 at rest increases to 123456 with certain movements  Precautions/Restrictions  Precautions Precautions: None Restrictions Weight Bearing Restrictions: Yes  Balance Screening Balance Screen Has the patient fallen in the past 6 months: No Has the patient had a decrease in activity level because of a fear of falling? : No Is the patient reluctant to leave their home because of a fear of falling? : No  Prior [place] expects to be discharged to:: Private residence Additional Comments: lives with his wife Prior Function Driving: Yes Vocation: Retired Biomedical scientist: retired [employer] Leisure: Hobbies-yes (Comment) Comments: enjoys working in the yard and watching TV  Assessment ADL/Vision/Perception ADL ADL Comments: movements that involve weightbearing, resisting supination -moving into pronation, moving into flexion, and bringing arm across body are painful.  Reaching back and pulling his seatbelt is painful. Dominant Hand: Right Vision - History Baseline Vision: Wears glasses only for reading  Cognition/Observation Cognition Overall Cognitive Status: Within Functional Limits for  tasks assessed  Sensation/Coordination/Edema Sensation Light Touch: Appears Intact (ulnar nerve path light sensation is intact) Coordination Gross Motor Movements are Fluid and Coordinated: Yes Fine Motor Movements are Fluid and Coordinated: Yes  Additional Assessments LUE AROM (degrees) LUE Overall AROM Comments: assessed in seated Left Elbow Flexion: 145 Left Elbow Extension: 0 Left Forearm Pronation: 82 Degrees Left Forearm  Supination: 82 Degrees Left Wrist Extension: 62 Degrees Left Wrist Flexion: 70 Degrees LUE Strength Left Elbow Flexion: 5/5 Left Elbow Extension: 5/5 Left Forearm Pronation: 5/5 Left Forearm Supination: 5/5 (pain in prontator teres origination with MMT) Left Wrist Flexion: 5/5 Left Wrist Extension: 5/5 (pain in medial epicondyle with MMT) Grip (lbs):  (elbow flexed 110 (115) elbow ext 125 bilateral ) Palpation Palpation: min-mod fascial restrictions in volar forearm, pronator teres, and medial epicondyle region     Exercise/Treatments    Manual Therapy Manual Therapy: Myofascial release Myofascial Release: MFR and manual stretching to left flexor and extensor forearm, pronator teres region, medial epicondyle region.   Occupational Therapy Assessment and Plan OT Assessment and Plan Clinical Impression Statement: A:  Patient presents with increased pain in left elbow region with functional activities such as weight bearing and bringing his arm across his body. Pt will benefit from skilled therapeutic intervention in order to improve on the following deficits: Decreased strength;Impaired sensation;Increased fascial restricitons;Pain Rehab Potential: Excellent OT Frequency: Min 2X/week OT Duration: 6 weeks OT Treatment/Interventions: Self-care/ADL training;Therapeutic exercise;Manual therapy;Modalities;Patient/family education;Therapeutic activities OT Plan: P:  Skilled OT intervention to decrease pain and fascial restricitons and improve strength in order to return to prior level of independence with all BIADLs and leisure activities.  Treatment Plan:  MFR and manual stretching to left volar elbow and forearm, pronator teres, taping to pronator teres, isomettric sttrengthening, progress as toerlated.    Goals Short Term Goals Time to Complete Short Term Goals: 3 weeks Short Term Goal 1: Patient will be educated on a HEP. Short Term Goal 2: Patient will decrease pain with resisted  forearm supination and wrist extension to 6/10.  Short Term Goal 3: Patien will improve left grip strength by 2 pounds for increased ability to open soda bottles.  Short Term Goal 4: Patient will decrease fascial restrictions to minimal in his left elbow and forearm.  Long Term Goals Time to Complete Long Term Goals: 6 weeks Long Term Goal 1: Patient will return to prior level of independence with B/IADLs and leisure activities.  Long Term Goal 2: Patient will decrease pain with resisted forearm supination and wrist extension to 2/10.  Long Term Goal 3: Patien will improve left grip strength by 5 pounds for increased ability to open soda bottles.  Long Term Goal 4: Patient will decrease fascial restrictions to trace in his left elbow and forearm.  Long Term Goal 5: Patient will be able to bear weight and reach his seatbelt without pain in his left elbow region.  Problem List Patient Active Problem List   Diagnosis Date Noted  . Left elbow pain 11/29/2013  . Chest pain 01/16/2013  . Bradycardia 01/16/2013  . Chest pain at rest 12/05/2012  . HEMORRHOIDS 11/02/2010  . 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 CONSTIPATION 11/02/2010  . DYSPHAGIA 11/02/2010  . FLATULENCE-GAS-BLOATING 11/02/2010  . HYPERLIPIDEMIA 08/10/2009  . HYPERTENSION 08/10/2009  . GERD 08/10/2009  . ABDOMINAL PAIN RIGHT UPPER QUADRANT 08/10/2009  . Nonspecific (abnormal) findings on radiological and other examination of biliary tract 08/10/2009  . PERSONAL HX COLONIC POLYPS 08/10/2009  . CHOLEDOCHOLITHIASIS 05/15/2009  .  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 NAUSEA 05/13/2009  . ABDOMINAL PAIN-EPIGASTRIC 05/13/2009    End of Session Activity Tolerance: Patient tolerated treatment well General Behavior During Therapy: WFL for tasks assessed/performed OT Plan of Care OT Home Exercise Plan: epicondylitis stretches, wrist AROM.  [place], OTR/L  11/29/2013, 5:10  PM  Physician Documentation Your signature is required to indicate approval of the treatment plan as stated above.  Please sign and either send electronically or make a copy of this report for your files and return this physician signed original.  Please mark one 1.__approve of plan  2. ___approve of plan with the following conditions.   ______________________________                                                          _____________________ Physician Signature                                                                                                             Date

## 2013-12-04 ENCOUNTER — Ambulatory Visit (HOSPITAL_COMMUNITY)
Admission: RE | Admit: 2013-12-04 | Discharge: 2013-12-04 | Disposition: A | Discharge status: Home / Self Care | Payer: 59 | Source: Ambulatory Visit | Attending: Family Medicine | Admitting: Family Medicine

## 2013-12-04 DIAGNOSIS — IMO0001 Reserved for inherently not codable concepts without codable children: Secondary | ICD-10-CM | POA: Insufficient documentation

## 2013-12-04 DIAGNOSIS — E785 Hyperlipidemia, unspecified: Secondary | ICD-10-CM | POA: Insufficient documentation

## 2013-12-04 DIAGNOSIS — R109 Unspecified abdominal pain: Secondary | ICD-10-CM | POA: Insufficient documentation

## 2013-12-04 DIAGNOSIS — M25529 Pain in unspecified elbow: Secondary | ICD-10-CM | POA: Insufficient documentation

## 2013-12-04 DIAGNOSIS — Z9861 Coronary angioplasty status: Secondary | ICD-10-CM | POA: Insufficient documentation

## 2013-12-04 DIAGNOSIS — I10 Essential (primary) hypertension: Secondary | ICD-10-CM | POA: Insufficient documentation

## 2013-12-04 DIAGNOSIS — G8929 Other chronic pain: Secondary | ICD-10-CM | POA: Insufficient documentation

## 2013-12-04 DIAGNOSIS — I2510 Atherosclerotic heart disease of native coronary artery without angina pectoris: Secondary | ICD-10-CM | POA: Insufficient documentation

## 2013-12-04 DIAGNOSIS — K219 Gastro-esophageal reflux disease without esophagitis: Secondary | ICD-10-CM | POA: Insufficient documentation

## 2013-12-04 NOTE — Progress Notes (Signed)
Occupational Therapy Treatment Patient Details  Name: Russell Padilla MRN: [id number] Date of Birth: 1955-10-20  Today's Date: 12/04/2013 Time: 9485-4627 OT Time Calculation (min): 40 min Manual 0350-0938 (20') Therapeutic Exercises 1323-1333 (10') Self-care/HEP 1829-9371 (10')  Visit#: 2 of 12  Re-eval: 12/27/13    Authorization: [employer]  Authorization Time Period:    Authorization Visit#:   of    Subjective Symptoms/Limitations Symptoms: S: "I was fine (no pain) until I put that hand gel on." (pain increased with pushing hands together) Limitations: progress as tolerated Pain Assessment Currently in Pain?: Yes Pain Score: 7  (with hand gelling) Pain Location: Elbow Pain Orientation: Left;Medial Pain Type: Acute pain  Precautions/Restrictions     Exercise/Treatments Elbow Exercises Forearm Supination: AROM;5 reps Forearm Pronation: AROM;5 reps Other elbow exercises: Yellow theraputty - pushing into putty low resistance with palm, elbow extended.     Wrist Exercises Forearm Supination: AROM;5 reps Forearm Pronation: AROM;5 reps     Manual Therapy Manual Therapy: Myofascial release Myofascial Release: MFR and manual stretching to left flexor and extensor forearm, pronator teres region, medial epicondyle region. Activities of Daily Living Activities of Daily Living: Educated and demonstrated ulnar nerve glide x5 reps. Pt verbalized and demonstrated good understanding.  Occupational Therapy Assessment and Plan OT Assessment and Plan Clinical Impression Statement: A: Pt had incresed pain at start of session due to pressing hands together to use hand sanitizing gel. pt demonstrated good understanding of ulnar nerve glide. Pt tolerated well theraputty. OT Plan: P: MFR and manual stretching. Taping to pronator teres. Isometrics.   Goals Short Term Goals Short Term Goal 1: Patient will be educated on a HEP. Short Term Goal 1 Progress: Progressing toward goal Short Term Goal  2: Patient will decrease pain with resisted forearm supination and wrist extension to 6/10.  Short Term Goal 2 Progress: Progressing toward goal Short Term Goal 3: Patien will improve left grip strength by 2 pounds for increased ability to open soda bottles.  Short Term Goal 3 Progress: Progressing toward goal Short Term Goal 4: Patient will decrease fascial restrictions to minimal in his left elbow and forearm.  Short Term Goal 4 Progress: Progressing toward goal Long Term Goals Long Term Goal 1: Patient will return to prior level of independence with B/IADLs and leisure activities.  Long Term Goal 1 Progress: Progressing toward goal Long Term Goal 2: Patient will decrease pain with resisted forearm supination and wrist extension to 2/10.  Long Term Goal 2 Progress: Progressing toward goal Long Term Goal 3: Patien will improve left grip strength by 5 pounds for increased ability to open soda bottles.  Long Term Goal 3 Progress: Progressing toward goal Long Term Goal 4: Patient will decrease fascial restrictions to trace in his left elbow and forearm.  Long Term Goal 4 Progress: Progressing toward goal Long Term Goal 5: Patient will be able to bear weight and reach his seatbelt without pain in his left elbow region. Long Term Goal 5 Progress: Progressing toward goal  Problem List Patient Active Problem List   Diagnosis Date Noted  . Left elbow pain 11/29/2013  . Chest pain 01/16/2013  . Bradycardia 01/16/2013  . Chest pain at rest 12/05/2012  . HEMORRHOIDS 11/02/2010  . 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 CONSTIPATION 11/02/2010  . DYSPHAGIA 11/02/2010  . FLATULENCE-GAS-BLOATING 11/02/2010  . HYPERLIPIDEMIA 08/10/2009  . HYPERTENSION 08/10/2009  . GERD 08/10/2009  . ABDOMINAL PAIN RIGHT UPPER QUADRANT 08/10/2009  . Nonspecific (abnormal) findings on radiological and other examination of  biliary tract 08/10/2009  . PERSONAL HX COLONIC POLYPS 08/10/2009  . CHOLEDOCHOLITHIASIS  05/15/2009  . 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 NAUSEA 05/13/2009  . ABDOMINAL PAIN-EPIGASTRIC 05/13/2009    End of Session Activity Tolerance: Patient tolerated treatment well General Behavior During Therapy: [facility] for tasks assessed/performed OT Plan of Care OT Home Exercise Plan: Added ulnar nerve glide OT Patient Instructions: handout provided- pt verbalized and demonstrted good understanding. Consulted and Agree with Plan of Care: Patient  [place], [place], OTR/L [phone]  12/04/2013, 4:27 PM

## 2013-12-06 ENCOUNTER — Ambulatory Visit (HOSPITAL_COMMUNITY)
Admission: RE | Admit: 2013-12-06 | Discharge: 2013-12-06 | Disposition: A | Discharge status: Home / Self Care | Payer: 59 | Source: Ambulatory Visit | Attending: Family Medicine | Admitting: Family Medicine

## 2013-12-06 DIAGNOSIS — M25522 Pain in left elbow: Secondary | ICD-10-CM

## 2013-12-06 NOTE — Progress Notes (Signed)
Occupational Therapy Treatment Patient Details  Name: KRYSTLE OBERMAN MRN: [id number] Date of Birth: 03-28-1956  Today's Date: 12/06/2013 Time: 3500-9381 OT Time Calculation (min): 40 min MFR 8299-3716 20' Ultrasound 9678-9381 12' Therex 0175-1025 8'   Visit#: 3 of 12  Re-eval: 12/27/13    Authorization: [employer]  Authorization Time Period:    Authorization Visit#:   of    Subjective Symptoms/Limitations Symptoms: S: I  don't really have pain today because I haven't done anything to it today.  Pain Assessment Currently in Pain?: Yes Pain Score: 3  Pain Location: Elbow Pain Orientation: Left;Medial;Lateral Pain Type: Acute pain  Precautions/Restrictions  Precautions Precautions: None  Exercise/Treatments Elbow Exercises Elbow Flexion: AROM;10 reps Elbow Extension: AROM;10 reps Forearm Supination: AROM;10 reps Forearm Pronation: AROM;10 reps         Modalities Modalities: Ultrasound Manual Therapy Manual Therapy: Myofascial release Myofascial Release: MFR and manual stretching to left flexor and extensor forearm, pronator teres region, medial epicondyle region. Ultrasound Ultrasound Location: medial region of left elbow Ultrasound Parameters: 3 MHz, 1.5 W/cm2, 5 minutes Ultrasound Goals: Pain  Occupational Therapy Assessment and Plan OT Assessment and Plan Clinical Impression Statement: A: Patient expresses a new pain location (brachioradialis muscle). Out of stock of kinesiotape and E-stim pads this date. US performed towards medial region of left elbow.  OT Plan: P: Taping to pronator teres. Electrical Stimulation. Isometrics. Give sample of Biofreeze.   Goals Short Term Goals Short Term Goal 1: Patient will be educated on a HEP. Short Term Goal 2: Patient will decrease pain with resisted forearm supination and wrist extension to 6/10.  Short Term Goal 3: Patien will improve left grip strength by 2 pounds for increased ability to open soda bottles.  Short Term  Goal 4: Patient will decrease fascial restrictions to minimal in his left elbow and forearm.  Long Term Goals Long Term Goal 1: Patient will return to prior level of independence with B/IADLs and leisure activities.  Long Term Goal 2: Patient will decrease pain with resisted forearm supination and wrist extension to 2/10.  Long Term Goal 3: Patien will improve left grip strength by 5 pounds for increased ability to open soda bottles.  Long Term Goal 4: Patient will decrease fascial restrictions to trace in his left elbow and forearm.  Long Term Goal 5: Patient will be able to bear weight and reach his seatbelt without pain in his left elbow region.  Problem List Patient Active Problem List   Diagnosis Date Noted  . Left elbow pain 11/29/2013  . Chest pain 01/16/2013  . Bradycardia 01/16/2013  . Chest pain at rest 12/05/2012  . HEMORRHOIDS 11/02/2010  . 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 CONSTIPATION 11/02/2010  . DYSPHAGIA 11/02/2010  . FLATULENCE-GAS-BLOATING 11/02/2010  . HYPERLIPIDEMIA 08/10/2009  . HYPERTENSION 08/10/2009  . GERD 08/10/2009  . ABDOMINAL PAIN RIGHT UPPER QUADRANT 08/10/2009  . Nonspecific (abnormal) findings on radiological and other examination of biliary tract 08/10/2009  . PERSONAL HX COLONIC POLYPS 08/10/2009  . CHOLEDOCHOLITHIASIS 05/15/2009  . 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 NAUSEA 05/13/2009  . ABDOMINAL PAIN-EPIGASTRIC 05/13/2009    End of Session Activity Tolerance: Patient tolerated treatment well General Behavior During Therapy: [facility] for tasks assessed/performed   Ailene Ravel, OTR/L,CBIS   12/06/2013, 2:17 PM

## 2013-12-31 ENCOUNTER — Other Ambulatory Visit (INDEPENDENT_AMBULATORY_CARE_PROVIDER_SITE_OTHER): Payer: 59

## 2013-12-31 ENCOUNTER — Ambulatory Visit (INDEPENDENT_AMBULATORY_CARE_PROVIDER_SITE_OTHER)
Admission: RE | Admit: 2013-12-31 | Discharge: 2013-12-31 | Disposition: A | Discharge status: Home / Self Care | Payer: 59 | Source: Ambulatory Visit | Attending: Internal Medicine | Admitting: Internal Medicine

## 2013-12-31 ENCOUNTER — Encounter: Payer: Self-pay | Admitting: Internal Medicine

## 2013-12-31 ENCOUNTER — Ambulatory Visit (INDEPENDENT_AMBULATORY_CARE_PROVIDER_SITE_OTHER): Payer: 59 | Admitting: Internal Medicine

## 2013-12-31 VITALS — BP 140/82 | HR 70 | Temp 98.0°F | Ht 71.75 in | Wt 229.8 lb

## 2013-12-31 DIAGNOSIS — R0989 Other specified symptoms and signs involving the circulatory and respiratory systems: Secondary | ICD-10-CM

## 2013-12-31 DIAGNOSIS — R0600 Dyspnea, unspecified: Secondary | ICD-10-CM

## 2013-12-31 DIAGNOSIS — R0609 Other forms of dyspnea: Secondary | ICD-10-CM

## 2013-12-31 LAB — CBC WITH DIFFERENTIAL/PLATELET
BASOS ABS: 0 10*3/uL (ref 0.0–0.1)
Basophils Relative: 0.3 % (ref 0.0–3.0)
EOS PCT: 2.9 % (ref 0.0–5.0)
Eosinophils Absolute: 0.1 10*3/uL (ref 0.0–0.7)
HCT: 43 % (ref 39.0–52.0)
Hemoglobin: 14.2 g/dL (ref 13.0–17.0)
Lymphocytes Relative: 25.7 % (ref 12.0–46.0)
Lymphs Abs: 1.2 10*3/uL (ref 0.7–4.0)
MCHC: 33 g/dL (ref 30.0–36.0)
MCV: 88.7 fl (ref 78.0–100.0)
MONO ABS: 0.4 10*3/uL (ref 0.1–1.0)
Monocytes Relative: 9.5 % (ref 3.0–12.0)
Neutro Abs: 2.8 10*3/uL (ref 1.4–7.7)
Neutrophils Relative %: 61.6 % (ref 43.0–77.0)
PLATELETS: 133 10*3/uL — AB (ref 150.0–400.0)
RBC: 4.85 Mil/uL (ref 4.22–5.81)
RDW: 14.1 % (ref 11.5–14.6)
WBC: 4.5 10*3/uL (ref 4.5–10.5)

## 2013-12-31 LAB — BASIC METABOLIC PANEL
BUN: 17 mg/dL (ref 6–23)
CALCIUM: 9 mg/dL (ref 8.4–10.5)
CO2: 28 meq/L (ref 19–32)
CREATININE: 1.5 mg/dL (ref 0.4–1.5)
Chloride: 102 mEq/L (ref 96–112)
GFR: 52.52 mL/min — ABNORMAL LOW (ref >60.00)
Glucose, Bld: 95 mg/dL (ref 70–99)
Potassium: 4.3 mEq/L (ref 3.5–5.1)
Sodium: 139 mEq/L (ref 135–145)

## 2013-12-31 LAB — BRAIN NATRIURETIC PEPTIDE: PRO B NATRI PEPTIDE: 12 pg/mL (ref 0.0–100.0)

## 2013-12-31 LAB — TSH: TSH: 1.59 u[IU]/mL (ref 0.35–5.50)

## 2013-12-31 NOTE — Assessment & Plan Note (Addendum)
-   12/31/2013  Walked RA x 3 laps @ 185 ft each stopped due to  No sob, no desat - spirometry 12/31/2013 FEV1  3.02 (77%) ratio 69 c/w min airflow obst off all inhalers and in settingof viral uri  Symptoms are markedly disproportionate to objective findings and not clear this is a lung problem but pt does appear to have difficult airway management issues.   Adherence is always the initial "prime suspect" and is a multilayered concern that requires a "trust but verify" approach in every patient - starting with knowing how to use medications, especially inhalers, correctly, keeping up with refills and understanding the fundamental difference between maintenance and prns vs those medications only taken for a very short course and then stopped and not refilled.   ? Acid (or non-acid) GERD > always difficult to exclude as up to 75% of pts in some series report no assoc GI/ Heartburn symptoms> rec max (24h)  acid suppression and diet restrictions/ reviewed and instructions given in writing.   ? Anxiety/ obesity/ deconditioning/ typically dx of exclusion but no other leads at this point and next step is CPST p taking his ppi optimal dosing plus diet    CHF excluded by bnp < < 100

## 2013-12-31 NOTE — Patient Instructions (Addendum)
Change protonix (pantoprazole):  Take 30- 60 min before your first and last meals of the day   GERD (REFLUX)  is an extremely common cause of respiratory symptoms, many times with no significant heartburn at all.    It can be treated with medication, but also with lifestyle changes including avoidance of late meals, excessive alcohol, smoking cessation, and avoid fatty foods, chocolate, peppermint, colas, red wine, and acidic juices such as orange juice.  NO MINT OR MENTHOL PRODUCTS SO NO COUGH DROPS  USE SUGARLESS CANDY INSTEAD (jolley ranchers or Stover's)  NO OIL BASED VITAMINS - use powdered substitutes.    Please remember to go to the lab and x-ray department downstairs for your tests - we will call you with the results when they are available and let you know the next step which is likely a CPST  Late add studies wnl > cpst for 2 weeks from now rec

## 2013-12-31 NOTE — Progress Notes (Signed)
Subjective:    Patient ID: Russell Padilla, male    DOB: 06/01/1956   MRN: [id number]  HPI   49 yowm quit smoking 12/2012 with sinus problems all adult life worse in summer with stuffy head but now lower resp problems or need for inhalers but immediately after stent March 2014  Sob attributed to bp pills ? metaprolol and norvasc    12/31/2013 1st Russell Padilla Pulmonary office visit/ Russell Padilla  Chief Complaint  Patient presents with  . Pulmonary Consult    Self referral- pt c/o DOE for the past year. 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   chronic doe x pick up something heavy >  worse if ate a big meal before acutely ill week of 3/19 new sob x going up steps/ no cough/  Since 12/27/13 with aching all over fever 100 and sore throat so stopped inhalers Chronic episgastric pain worse when eat neg w/u [employer] x 4 ye never improves with any rx and on ppi but not taking ac.  25 lb wt gain since quit smoking   No obvious other patterns in day to day or daytime variabilty or assoc chronic cough or cp or chest tightness, subjective wheeze overt sinus or hb symptoms. No unusual exp hx or h/o childhood pna/ asthma or knowledge of premature birth.  Sleeping ok without nocturnal  or early am exacerbation  of respiratory  c/o's or need for noct saba. Also denies any obvious fluctuation of symptoms with weather or environmental changes or other aggravating or alleviating factors except as outlined above   Current Medications, Allergies, Complete Past Medical History, Past Surgical History, Family History, and Social History were reviewed in [employer] record.             Review of Systems  Constitutional: Negative for fever, chills, activity change, appetite change and unexpected weight change.  HENT: Positive for congestion and voice change. Negative for dental problem, postnasal drip, rhinorrhea, sneezing, sore throat and trouble swallowing.    Eyes: Negative for visual disturbance.  Respiratory: Positive for cough and shortness of breath. Negative for choking.   Cardiovascular: Negative for chest pain and leg swelling.  Gastrointestinal: Negative for nausea, vomiting and abdominal pain.  Genitourinary: Negative for difficulty urinating.  Musculoskeletal: Positive for arthralgias.  Skin: Negative for rash.  Psychiatric/Behavioral: Negative for behavioral problems and confusion.       Objective:   Physical Exam  Wt Readings from Last 3 Encounters:  12/31/13 229 lb 12.8 oz (104.237 kg)  09/20/13 225 lb (102.059 kg)  07/11/13 220 lb (99.791 kg)     Anxious hoarse wm nad  HEENT: nl dentition, turbinates, and orophanx. Nl external ear canals without cough reflex   NECK :  without JVD/Nodes/TM/ nl carotid upstrokes bilaterally   LUNGS: no acc muscle use, clear to A and P bilaterally without cough on insp or exp maneuvers   CV:  RRR  no s3 or murmur or increase in P2, no edema   ABD:  soft and nontender with nl excursion in the supine position. No bruits or organomegaly, bowel sounds nl  MS:  warm without deformities, calf tenderness, cyanosis or clubbing  SKIN: warm and dry without lesions    NEURO:  alert, approp, no deficits     CXR  12/31/2013 :  There is no evidence of pneumonia nor CHF nor other acute cardiopulmonary abnormality. There is likely underlying COPD.   Labs 12/31/2013  Recent Labs Lab 12/31/13 1129  NA 139  K 4.3  CL 102  CO2 28  BUN 17  CREATININE 1.5  GLUCOSE 95    Recent Labs Lab 12/31/13 1129  HGB 14.2  HCT 43.0  WBC 4.5  PLT 133.0*     Lab Results  Component Value Date   TSH 1.59 12/31/2013    Lab Results  Component Value Date   PROBNP 12.0 12/31/2013        Assessment & Plan:

## 2013-12-31 NOTE — Progress Notes (Signed)
Quick Note:  Spoke with pt and notified of results per Dr. Wert. Pt verbalized understanding and denied any questions.  ______ 

## 2014-01-01 ENCOUNTER — Telehealth: Payer: Self-pay | Admitting: *Deleted

## 2014-01-01 DIAGNOSIS — R0600 Dyspnea, unspecified: Secondary | ICD-10-CM

## 2014-01-01 LAB — D-DIMER, QUANTITATIVE (NOT AT ARMC): D DIMER QUANT: 0.35 ug{FEU}/mL (ref 0.00–0.48)

## 2014-01-01 NOTE — Progress Notes (Signed)
Quick Note:  Spoke with pt and notified of results per Dr. Wert. Pt verbalized understanding and denied any questions.  ______ 

## 2014-01-01 NOTE — Telephone Encounter (Signed)
Message copied by Rosana Berger on Wed Jan 01, 2014  4:40 PM ------      Message from: Tanda Rockers      Created: Tue Dec 31, 2013  3:56 PM       Schedule cpst in 2 weeks  ------

## 2014-01-06 ENCOUNTER — Telehealth: Payer: Self-pay | Admitting: Internal Medicine

## 2014-01-06 NOTE — Telephone Encounter (Signed)
Spoke with the pt  I advised that the CPST will measure his heart, lungs and muscles while he exercises  He verbalized understanding  He is asking if this can be rescheduled for sometime later in the day  Will forward to [facility] for this  Please advise thanks!

## 2014-01-07 NOTE — Telephone Encounter (Signed)
cpst scheduled 01/15/14 pt aware and precert has been done [employer]

## 2014-01-15 ENCOUNTER — Ambulatory Visit (HOSPITAL_COMMUNITY): Payer: 59 | Attending: Internal Medicine

## 2014-01-15 ENCOUNTER — Encounter (HOSPITAL_COMMUNITY): Payer: 59

## 2014-01-15 DIAGNOSIS — R0989 Other specified symptoms and signs involving the circulatory and respiratory systems: Principal | ICD-10-CM | POA: Insufficient documentation

## 2014-01-15 DIAGNOSIS — R0609 Other forms of dyspnea: Secondary | ICD-10-CM | POA: Insufficient documentation

## 2014-01-15 DIAGNOSIS — R0600 Dyspnea, unspecified: Secondary | ICD-10-CM

## 2014-01-17 ENCOUNTER — Encounter: Payer: Self-pay | Admitting: Internal Medicine

## 2014-01-17 ENCOUNTER — Telehealth: Payer: Self-pay | Admitting: Internal Medicine

## 2014-01-17 NOTE — Telephone Encounter (Signed)
mild impairment c/w deconditioning - no heart or lung or muscle problems identified  rec    To get the most out of exercise, you need to be continuously aware that you are short of breath, but never out of breath, for 30 minutes daily. As you improve, it will actually be easier for you to do the same amount of exercise  in  30 minutes so always push to the level where you are short of breath.  If this does not result in gradual improvement over next 4-6 weeks, then return to regroup !

## 2014-01-17 NOTE — Telephone Encounter (Signed)
Pt had CPST done 01/15/14. Pt is calling requesting these results. Please advise MW thanks

## 2014-01-17 NOTE — Telephone Encounter (Signed)
Pt reports he doesn't understand this. He reports any time he bends over/eats anything he feels SOB. He feels like something is pushing on his diaphragm. He wanted to schedule OV to see MW. This has been scheduled for Tuesday at 4 pm. Nothing further needed

## 2014-01-21 ENCOUNTER — Ambulatory Visit: Payer: 59 | Admitting: Internal Medicine

## 2014-01-24 DIAGNOSIS — R0602 Shortness of breath: Secondary | ICD-10-CM

## 2014-01-27 NOTE — Progress Notes (Signed)
Quick Note:  Spoke with pt and notified of results per Dr. Wert. Pt verbalized understanding and denied any questions.  ______ 

## 2014-02-21 ENCOUNTER — Encounter: Payer: Self-pay | Admitting: Cardiovascular Disease

## 2014-03-20 ENCOUNTER — Ambulatory Visit: Payer: 59 | Admitting: Cardiovascular Disease

## 2014-04-08 NOTE — Progress Notes (Signed)
  Patient Details  Name: Russell Padilla MRN: [id number] Date of Birth: 1956-09-30  Today's Date: 04/08/2014 The above patient was discharged from OT on 04/08/2014 secondary to failure to return to clinic since 12/06/2013.  Ailene Ravel, OTR/L,CBIS   04/08/2014, 4:24 PM

## 2014-04-08 NOTE — Addendum Note (Signed)
Encounter addended by: Debby Bud, OT on: 04/08/2014  4:25 PM<BR>     Documentation filed: Episodes, Clinical Notes, Letters

## 2014-04-10 NOTE — Progress Notes (Signed)
History of Present Illness: 58 y.o. male with history of CAD, HTN, HLD, chronic neck and shoulder pain who is here today for cardiac follow up. He was admitted March 2014 with chest pain. LHC 12/05/12: Ostial LAD 20%, proximal LAD 50%, ostial OM1 30%, superior branch of OM2 ostial to proximal 90%, smaller inferior branch proximal 70-80%, non-dominant RCA with diffuse moderate to severe disease throughout the proximal two thirds. I performed PCI with placement of a Promus Premier DES in the superior branch of the OM2. Medical therapy recommended for the RCA which is a small nondominant vessel. Echocardiogram 12/04/12: Mild LVH, EF 32-35%, grade 1 diastolic dysfunction. Readmitted 01/16/13 with chest pain and repeat cath stable with patent stent.   He is here today for follow up.  The patient denies chest pain. He continues to have dyspnea. Full pulmonary workup was overall ok per pt. No syncope, orthopnea, PND or significant pedal edema. He does have some fatigue. He has not tolerated beta blockers.   Primary Care Physician: Antony Contras  Lipid profile: Followed in primary care.  Past Medical History  Diagnosis Date  . Renal disorder     renal calculi  . Stomach pain     chronic  . CAD (coronary artery disease)     a. DES OM March 2014.  Repeat cath 4/14 with 20% LAD, patent stent OM3, 70% diffuse stenosis small, non-dominant RCA  . HLD (hyperlipidemia)   . HTN (hypertension)   . Abdominal pain 07-11-13    ongoing and unspecified  . Arthritis   . Shortness of breath     occ. and ? related to med. ? relalted to reflux.  . Bladder stone 07-11-13    surgery planned    Past Surgical History  Procedure Laterality Date  . Back surgery      2 cervical/ 2 lumbar fusions  . Coronary stent placement      3'14  . Knee arthroscopy Left     '80's  . Cholecystectomy      laparoscopic.  Marland Kitchen Esophagogastroduodenoscopy endoscopy      multiple times-[facility]/ and now being followed by Endo- MD [facility]     Current Outpatient Prescriptions  Medication Sig Dispense Refill  . aspirin EC 81 MG tablet Take 81 mg by mouth daily.      . clopidogrel (PLAVIX) 75 MG tablet Take 1 tablet (75 mg total) by mouth every morning.  90 tablet  3  . cyclobenzaprine (FLEXERIL) 10 MG tablet Take 10 mg by mouth 3 (three) times daily as needed for muscle spasms.      Marland Kitchen dicyclomine (BENTYL) 20 MG tablet Take 20 mg by mouth 4 (four) times daily as needed (IBS).       Marland Kitchen docusate sodium (COLACE) 100 MG capsule Take 100 mg by mouth daily.       . nitroGLYCERIN (NITROSTAT) 0.4 MG SL tablet Place 1 tablet (0.4 mg total) under the tongue every 5 (five) minutes as needed for chest pain.  25 tablet  6  . olmesartan (BENICAR) 40 MG tablet Take 40 mg by mouth daily.      Marland Kitchen oxyCODONE-acetaminophen (PERCOCET/ROXICET) 5-325 MG per tablet Take 1 tablet by mouth every 4 (four) hours as needed for pain.      . pantoprazole (PROTONIX) 40 MG tablet Take 40 mg by mouth 2 (two) times daily.       . Probiotic Product (PROBIOTIC DAILY PO) Take 1 tablet by mouth daily.      Marland Kitchen  senna (SENOKOT) 8.6 MG TABS Take 2 tablets by mouth at bedtime.       . sildenafil (VIAGRA) 100 MG tablet Take 100 mg by mouth daily as needed for erectile dysfunction.      . simvastatin (ZOCOR) 40 MG tablet Take 1 tablet by mouth  every evening  30 tablet  6  . sucralfate (CARAFATE) 1 G tablet Take 1 g by mouth 4 (four) times daily.      . tamsulosin (FLOMAX) 0.4 MG CAPS capsule Take 0.4 mg by mouth daily after supper.       No current facility-administered medications for this visit.    Allergies  Allergen Reactions  . Latex Hives and Rash    History   Social History  . Marital Status: Married    Spouse Name: N/A    Number of Children: N/A  . Years of Education: N/A   Occupational History  . Retired    Social History Main Topics  . Smoking status: Former Smoker -- 0.50 packs/day for 30 years    Types: Cigarettes    Start date: 12/24/1976     Quit date: 12/04/2012  . Smokeless tobacco: Not on file  . Alcohol Use: No  . Drug Use: No  . Sexual Activity: Not on file   Other Topics Concern  . Not on file   Social History Narrative  . No narrative on file    Family History  Problem Relation Age of Onset  . Heart disease Brother   . Stomach cancer Mother     Review of Systems:  As stated in the HPI and otherwise negative.   BP 118/74  Pulse 59  Ht 5' 11.5" (1.816 m)  Wt 224 lb (101.606 kg)  BMI 30.81 kg/m2  Physical Examination: General: Well developed, well nourished, NAD HEENT: OP clear, mucus membranes moist SKIN: warm, dry. No rashes. Neuro: No focal deficits Musculoskeletal: Muscle strength 5/5 all ext Psychiatric: Mood and affect normal Neck: No JVD, no carotid bruits, no thyromegaly, no lymphadenopathy. Lungs:Clear bilaterally, no wheezes, rhonci, crackles Cardiovascular: Regular rate and rhythm. No murmurs, gallops or rubs. Abdomen:Soft. Bowel sounds present. Non-tender.  Extremities: No lower extremity edema. Pulses are 2 + in the bilateral DP/PT.  EKG: Sinus brady, rate 59 bpm.   Assessment and Plan:   1. CAD: Stable. NSTEMI in March 2014 with DES OM and repeat cath April 2014 with stable disease. Will stop Plavix due to bruising. Continue ASA for now. He did not tolerate beta blockers. Will continue statin.   2. HTN: BP controlled. No changes.   3. HLD: Continue statin. Lipids followed in primary care.   4. 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

## 2014-04-11 ENCOUNTER — Encounter: Payer: Self-pay | Admitting: Cardiovascular Disease

## 2014-04-11 ENCOUNTER — Ambulatory Visit (INDEPENDENT_AMBULATORY_CARE_PROVIDER_SITE_OTHER): Payer: 59 | Admitting: Cardiovascular Disease

## 2014-04-11 VITALS — BP 118/74 | HR 59 | Ht 71.5 in | Wt 224.0 lb

## 2014-04-11 DIAGNOSIS — F17201 Nicotine dependence, unspecified, in remission: Secondary | ICD-10-CM

## 2014-04-11 DIAGNOSIS — E785 Hyperlipidemia, unspecified: Secondary | ICD-10-CM

## 2014-04-11 DIAGNOSIS — I10 Essential (primary) hypertension: Secondary | ICD-10-CM

## 2014-04-11 DIAGNOSIS — I2510 Atherosclerotic heart disease of native coronary artery without angina pectoris: Secondary | ICD-10-CM

## 2014-04-11 DIAGNOSIS — Z87891 Personal history of nicotine dependence: Secondary | ICD-10-CM

## 2014-04-11 NOTE — Patient Instructions (Signed)
Your physician wants you to follow-up in:  6 months.  You will receive a reminder letter in the mail two months in advance. If you don't receive a letter, please call our office to schedule the follow-up appointment.  Your physician has recommended you make the following change in your medication:  Stop Plavix.(clopidogrel)

## 2014-08-12 ENCOUNTER — Encounter: Payer: Self-pay | Admitting: Cardiovascular Disease

## 2014-09-11 ENCOUNTER — Encounter (HOSPITAL_COMMUNITY): Payer: Self-pay | Admitting: Cardiovascular Disease

## 2014-10-09 ENCOUNTER — Other Ambulatory Visit: Payer: Self-pay | Admitting: Neurological Surgery

## 2014-10-09 DIAGNOSIS — M48061 Spinal stenosis, lumbar region without neurogenic claudication: Secondary | ICD-10-CM

## 2014-10-09 DIAGNOSIS — M4802 Spinal stenosis, cervical region: Secondary | ICD-10-CM

## 2014-10-15 ENCOUNTER — Encounter: Payer: Self-pay | Admitting: Cardiovascular Disease

## 2014-10-15 ENCOUNTER — Ambulatory Visit (INDEPENDENT_AMBULATORY_CARE_PROVIDER_SITE_OTHER): Payer: 59 | Admitting: Cardiovascular Disease

## 2014-10-15 VITALS — BP 122/68 | HR 65 | Ht 71.5 in | Wt 228.8 lb

## 2014-10-15 DIAGNOSIS — I10 Essential (primary) hypertension: Secondary | ICD-10-CM

## 2014-10-15 DIAGNOSIS — F17201 Nicotine dependence, unspecified, in remission: Secondary | ICD-10-CM

## 2014-10-15 DIAGNOSIS — E785 Hyperlipidemia, unspecified: Secondary | ICD-10-CM

## 2014-10-15 DIAGNOSIS — I2510 Atherosclerotic heart disease of native coronary artery without angina pectoris: Secondary | ICD-10-CM

## 2014-10-15 NOTE — Progress Notes (Signed)
History of Present Illness: 59 y.o. male with history of CAD, HTN, HLD, chronic neck and shoulder pain who is here today for cardiac follow up. He was admitted March 2014 with chest pain. LHC 12/05/12: Ostial LAD 20%, proximal LAD 50%, ostial OM1 30%, superior branch of OM2 ostial to proximal 90%, smaller inferior branch proximal 70-80%, non-dominant RCA with diffuse moderate to severe disease throughout the proximal two thirds. I performed PCI with placement of a Promus Premier DES in the superior branch of the OM2. Medical therapy recommended for the RCA which is a small nondominant vessel. Echocardiogram 12/04/12: Mild LVH, EF 41-93%, grade 1 diastolic dysfunction. Readmitted 01/16/13 with chest pain and repeat cath stable with patent stent. Full pulmonary workup was overall ok.  He has seen a Hematologist for thrombocytopenia and workup was ok.   He is here today for follow up. The patient denies chest pain. He continues to have fatigue. No syncope, orthopnea, PND or significant pedal edema. He has not tolerated beta blockers.   Primary Care Physician: Antony Contras and Sharilyn Sites  Lipid profile: Followed in primary care.  Past Medical History  Diagnosis Date  . Renal disorder     renal calculi  . Stomach pain     chronic  . CAD (coronary artery disease)     a. DES OM March 2014.  Repeat cath 4/14 with 20% LAD, patent stent OM3, 70% diffuse stenosis small, non-dominant RCA  . HLD (hyperlipidemia)   . HTN (hypertension)   . Abdominal pain 07-11-13    ongoing and unspecified  . Arthritis   . Shortness of breath     occ. and ? related to med. ? relalted to reflux.  . Bladder stone 07-11-13    surgery planned    Past Surgical History  Procedure Laterality Date  . Back surgery      2 cervical/ 2 lumbar fusions  . Coronary stent placement      3'14  . Knee arthroscopy Left     '80's  . Cholecystectomy      laparoscopic.  Marland Kitchen Esophagogastroduodenoscopy endoscopy      multiple  times-[facility]/ and now being followed by Endo- [facility]  . Left heart catheterization with coronary angiogram N/A 12/05/2012    Procedure: LEFT HEART CATHETERIZATION WITH CORONARY ANGIOGRAM;  Surgeon: Birdie Riddle, MD;  Location: [place] CATH LAB;  Service: Cardiovascular;  Laterality: N/A;  . Percutaneous coronary stent intervention (pci-s) N/A 12/06/2012    Procedure: PERCUTANEOUS CORONARY STENT INTERVENTION (PCI-S);  Surgeon: Burnell Blanks, MD;  Location: [facility] CATH LAB;  Service: Cardiovascular;  Laterality: N/A;  . Left heart catheterization with coronary angiogram N/A 01/17/2013    Procedure: LEFT HEART CATHETERIZATION WITH CORONARY ANGIOGRAM;  Surgeon: Wellington Hampshire, MD;  Location: [place] CATH LAB;  Service: Cardiovascular;  Laterality: N/A;    Current Outpatient Prescriptions  Medication Sig Dispense Refill  . aspirin EC 81 MG tablet Take 81 mg by mouth daily.    . cyclobenzaprine (FLEXERIL) 10 MG tablet Take 10 mg by mouth 3 (three) times daily as needed for muscle spasms.    Marland Kitchen dicyclomine (BENTYL) 20 MG tablet Take 20 mg by mouth 4 (four) times daily as needed (IBS).     Marland Kitchen docusate sodium (COLACE) 100 MG capsule Take 100 mg by mouth daily.     . nitroGLYCERIN (NITROSTAT) 0.4 MG SL tablet Place 1 tablet (0.4 mg total) under the tongue every 5 (five) minutes as needed for chest pain.  25 tablet 6  . olmesartan (BENICAR) 40 MG tablet Take 40 mg by mouth daily.    Marland Kitchen oxyCODONE-acetaminophen (PERCOCET/ROXICET) 5-325 MG per tablet Take 1 tablet by mouth every 4 (four) hours as needed for pain.    . pantoprazole (PROTONIX) 40 MG tablet Take 40 mg by mouth 2 (two) times daily.     . Probiotic Product (PROBIOTIC DAILY PO) Take 1 tablet by mouth daily.    Marland Kitchen senna (SENOKOT) 8.6 MG TABS Take 2 tablets by mouth at bedtime.     . sildenafil (VIAGRA) 100 MG tablet Take 100 mg by mouth daily as needed for erectile dysfunction.    . simvastatin (ZOCOR) 40 MG tablet Take 1 tablet by mouth  every evening  (Patient taking differently: Take  tablet by mouth  every evening) 30 tablet 6  . sucralfate (CARAFATE) 1 GM/10ML suspension Take 1 g by mouth 4 (four) times daily -  with meals and at bedtime.     No current facility-administered medications for this visit.    Allergies  Allergen Reactions  . Latex Hives and Rash    When at dentist    History   Social History  . Marital Status: Married    Spouse Name: N/A    Number of Children: N/A  . Years of Education: N/A   Occupational History  . Retired    Social History Main Topics  . Smoking status: Former Smoker -- 0.50 packs/day for 30 years    Types: Cigarettes    Start date: 12/24/1976    Quit date: 12/04/2012  . Smokeless tobacco: Not on file  . Alcohol Use: No  . Drug Use: No  . Sexual Activity: Not on file   Other Topics Concern  . Not on file   Social History Narrative    Family History  Problem Relation Age of Onset  . Heart disease Brother   . Stomach cancer Mother     Review of Systems:  As stated in the HPI and otherwise negative.   BP 122/68 mmHg  Pulse 65  Ht 5' 11.5" (1.816 m)  Wt 228 lb 12.8 oz (103.783 kg)  BMI 31.47 kg/m2  SpO2 95%  Physical Examination: General: Well developed, well nourished, NAD HEENT: OP clear, mucus membranes moist SKIN: warm, dry. No rashes. Neuro: No focal deficits Musculoskeletal: Muscle strength 5/5 all ext Psychiatric: Mood and affect normal Neck: No JVD, no carotid bruits, no thyromegaly, no lymphadenopathy. Lungs:Clear bilaterally, no wheezes, rhonci, crackles Cardiovascular: Regular rate and rhythm. No murmurs, gallops or rubs. Abdomen:Soft. Bowel sounds present. Non-tender.  Extremities: No lower extremity edema. Pulses are 2 + in the bilateral DP/PT.  -   Assessment and Plan:   1. CAD: Stable but still with fatigue and occasional burning in neck.  NSTEMI in March 2014 with DES OM and repeat cath April 2014 with stable disease. Plavix stopped due to  bruising. Continue ASA for now. He did not tolerate beta blockers. Will continue statin. Will arrange exercise stress test to assess exercise tolerance, exclude ischemia.   2. HTN: BP controlled. No changes. We reviewed indications for ARB.   3. HLD: Continue statin. Lipids checked recently in primary care. Reviewed by me today and LDL is under 100. Goal under 70.   4. 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

## 2014-10-15 NOTE — Patient Instructions (Signed)
Your physician wants you to follow-up in:  6 months. You will receive a reminder letter in the mail two months in advance. If you don't receive a letter, please call our office to schedule the follow-up appointment.  Your physician has requested that you have an exercise tolerance test. For further information please visit [email or web address]. Please also follow instruction sheet, as given.    

## 2014-10-22 ENCOUNTER — Ambulatory Visit
Admission: RE | Admit: 2014-10-22 | Discharge: 2014-10-22 | Disposition: A | Discharge status: Home / Self Care | Payer: 59 | Source: Ambulatory Visit | Attending: Neurological Surgery | Admitting: Neurological Surgery

## 2014-10-22 DIAGNOSIS — M4802 Spinal stenosis, cervical region: Secondary | ICD-10-CM

## 2014-10-22 DIAGNOSIS — M48061 Spinal stenosis, lumbar region without neurogenic claudication: Secondary | ICD-10-CM

## 2014-10-22 MED ORDER — GADOBENATE DIMEGLUMINE 529 MG/ML IV SOLN
10.0000 mL | Freq: Once | INTRAVENOUS | Status: AC | PRN
  Administered 2014-10-22: 10 mL via INTRAVENOUS

## 2014-11-26 ENCOUNTER — Encounter: Payer: 59 | Admitting: Physician Assistant

## 2014-11-26 ENCOUNTER — Ambulatory Visit: Payer: 59 | Admitting: Physician Assistant

## 2014-12-22 DIAGNOSIS — N400 Enlarged prostate without lower urinary tract symptoms: Secondary | ICD-10-CM | POA: Insufficient documentation

## 2014-12-22 DIAGNOSIS — R6882 Decreased libido: Secondary | ICD-10-CM | POA: Insufficient documentation

## 2014-12-22 DIAGNOSIS — E291 Testicular hypofunction: Secondary | ICD-10-CM | POA: Insufficient documentation

## 2015-02-05 ENCOUNTER — Encounter: Payer: Self-pay | Admitting: Nurse Practitioner

## 2015-02-05 ENCOUNTER — Ambulatory Visit (INDEPENDENT_AMBULATORY_CARE_PROVIDER_SITE_OTHER): Payer: 59 | Admitting: Nurse Practitioner

## 2015-02-05 VITALS — BP 118/82 | HR 71 | Ht 71.5 in | Wt 220.0 lb

## 2015-02-05 DIAGNOSIS — R079 Chest pain, unspecified: Secondary | ICD-10-CM

## 2015-02-05 DIAGNOSIS — I10 Essential (primary) hypertension: Secondary | ICD-10-CM | POA: Insufficient documentation

## 2015-02-05 DIAGNOSIS — E785 Hyperlipidemia, unspecified: Secondary | ICD-10-CM | POA: Diagnosis not present

## 2015-02-05 DIAGNOSIS — I25119 Atherosclerotic heart disease of native coronary artery with unspecified angina pectoris: Secondary | ICD-10-CM | POA: Diagnosis not present

## 2015-02-05 NOTE — Progress Notes (Signed)
Patient Name: Russell Padilla Date of Encounter: 02/05/2015  Primary Care Provider:  Gara Kroner, MD Primary Cardiologist:  C. Angelena Form, MD   Chief Complaint  Who presented to clinic today secondary to intermittent reproducible left-sided chest discomfort.  Past Medical History   Past Medical History  Diagnosis Date  . Renal disorder     renal calculi  . Stomach pain     chronic  . CAD (coronary artery disease)     a. 12/2012 Cath/PCI: DES to superior branch of OM2;  b. Repeat cath 4/14 with 20% LAD, patent stent OM3, 70% diffuse stenosis small, non-dominant RCA.  Marland Kitchen HLD (hyperlipidemia)   . Essential hypertension   . Abdominal pain 07-11-13    ongoing and unspecified  . Arthritis   . Bladder stone 07-11-13    surgery planned   Past Surgical History  Procedure Laterality Date  . Back surgery      2 cervical/ 2 lumbar fusions  . Coronary stent placement      3'14  . Knee arthroscopy Left     '80's  . Cholecystectomy      laparoscopic.  Marland Kitchen Esophagogastroduodenoscopy endoscopy      multiple times-[facility]/ and now being followed by Endo- [facility]  . Left heart catheterization with coronary angiogram N/A 12/05/2012    Procedure: LEFT HEART CATHETERIZATION WITH CORONARY ANGIOGRAM;  Surgeon: Birdie Riddle, MD;  Location: [place] CATH LAB;  Service: Cardiovascular;  Laterality: N/A;  . Percutaneous coronary stent intervention (pci-s) N/A 12/06/2012    Procedure: PERCUTANEOUS CORONARY STENT INTERVENTION (PCI-S);  Surgeon: Burnell Blanks, MD;  Location: [facility] CATH LAB;  Service: Cardiovascular;  Laterality: N/A;  . Left heart catheterization with coronary angiogram N/A 01/17/2013    Procedure: LEFT HEART CATHETERIZATION WITH CORONARY ANGIOGRAM;  Surgeon: Wellington Hampshire, MD;  Location: [place] CATH LAB;  Service: Cardiovascular;  Laterality: N/A;    Allergies  Allergies  Allergen Reactions  . Latex Hives and Rash    When at dentist    HPI  59 year old male with prior history  of coronary artery disease status post stenting of the superior branch of the OM 2 and March 2014. Subsequent catheterization a month later due to ongoing chest pain revealed patency of previously placed stent. He has chronic neck and left arm pain but typically does not experience chest pain. He also has intermittent epigastric and GI pain with extensive GI workups in [place], [place], and [place]. About a week or so ago, he [place] and had 3 separate episodes of acute diaphoresis without associated symptoms, lasting 20 or 30 minutes, and resolving spontaneously. He did not have to stop what he was doing during these episodes. Since then, he has had intermittent sharp left chest and shoulder pain that is worse with palpation and position changes. Symptoms can last several hours a day but do not miss only limited activities. He had recurrent sharp and focal left-sided chest pain along the left lower sternal border last night while showering. This then moved into his epigastric area and subsequently resolved. Because of these symptoms, he decided to walk in to the office today for evaluation. He denies dyspnea on exertion, PND, orthopnea, dizziness, syncope, or edema. Symptoms are not similar to prior anginal equivalent, which was more of a substernal chest heaviness.  Home Medications  Prior to Admission medications   Medication Sig Start Date End Date Taking? Authorizing Provider  aspirin 81 MG EC tablet Take 81  mg by mouth daily. Swallow whole.   Yes Historical Provider, MD  cyclobenzaprine (FLEXERIL) 10 MG tablet Take 10 mg by mouth 3 (three) times daily as needed for muscle spasms.   Yes Historical Provider, MD  dicyclomine (BENTYL) 20 MG tablet Take 20 mg by mouth 4 (four) times daily as needed (IBS).    Yes Historical Provider, MD  docusate sodium (COLACE) 100 MG capsule Take 100 mg by mouth daily.    Yes Historical Provider, MD  nitroGLYCERIN (NITROSTAT) 0.4  MG SL tablet Place 1 tablet (0.4 mg total) under the tongue every 5 (five) minutes as needed for chest pain. 10/28/13  Yes Burnell Blanks, MD  olmesartan (BENICAR) 40 MG tablet Take 40 mg by mouth daily.   Yes Historical Provider, MD  oxyCODONE-acetaminophen (PERCOCET/ROXICET) 5-325 MG per tablet Take 1 tablet by mouth every 4 (four) hours as needed for pain.   Yes Historical Provider, MD  pantoprazole (PROTONIX) 40 MG tablet Take 40 mg by mouth 2 (two) times daily.    Yes Historical Provider, MD  Probiotic Product (PROBIOTIC DAILY PO) Take 1 tablet by mouth daily.   Yes Historical Provider, MD  senna (SENOKOT) 8.6 MG TABS Take 2 tablets by mouth at bedtime.    Yes Historical Provider, MD  sildenafil (VIAGRA) 100 MG tablet Take 100 mg by mouth daily as needed for erectile dysfunction.   Yes Historical Provider, MD  simvastatin (ZOCOR) 40 MG tablet Take 20 mg by mouth at bedtime. Patient taking one half tablet (20 mg) by mouth every evening   Yes Historical Provider, MD  sucralfate (CARAFATE) 1 GM/10ML suspension Take 1 g by mouth 4 (four) times daily -  with meals and at bedtime.   Yes Historical Provider, MD    Review of Systems  As above, he's been experiencing intermittent sharp, left-sided chest and shoulder pain. He also had episodes of diaphoresis about a week ago. He has chronic low back pain, neck pain and left arm pain. He denies PND, orthopnea, dizziness, syncope, edema, or early satiety. All other systems reviewed and are otherwise negative except as noted above.  Physical Exam  VS:  BP 118/82 mmHg  Pulse 71  Ht 5' 11.5" (1.816 m)  Wt 220 lb (99.791 kg)  BMI 30.26 kg/m2 , BMI Body mass index is 30.26 kg/(m^2). GEN: Well nourished, well developed, in no acute distress. HEENT: normal. Neck: Supple, no JVD, carotid bruits, or masses. Cardiac: RRR, no murmurs, rubs, or gallops. No clubbing, cyanosis, edema.  Radials/DP/PT 2+ and equal bilaterally. Chest wall pain is  reproducible with palpation along the left sternal border. Respiratory:  Respirations regular and unlabored, clear to auscultation bilaterally. GI: Soft, nontender, nondistended, BS + x 4. MS: no deformity or atrophy. Skin: warm and dry, no rash. Neuro:  Strength and sensation are intact. Psych: Normal affect.  Accessory Clinical Findings  ECG - regular sinus rhythm, 67, nonspecific T changes.  Assessment & Plan  1.  Left-sided chest pain/coronary artery disease: Patient presents with a several-day history of intermittent left-sided chest and arm pain that is worse with palpation. His chest pain is fairly focal at the left lower sternal border. Discomfort is also worse with position changing and expansion of his chest. His ECG is nonacute. I suspect that this is noncardiac but given his history and Dr. Camillia Herter prior recommendation for stress testing in January (cancelled 2/2 pts wife's back surgeries), I will arrange for a Lexiscan Myoview to rule out ischemia. He has back  pain as well and doesn't think that he'll be able to walk on a treadmill at this time. Continue aspirin and statin therapy. He was previously intolerant to beta blockers. He has when necessary nitrates if needed.  2. Essential hypertension: Stable on ARB therapy.  3. Hyperlipidemia: Followed by his primary care provider. He is on simvastatin.  4. Disposition: Stress testing as above. Follow-up with Dr. Angelena Form in 1-2 months or sooner if necessary.   Murray Hodgkins, NP 02/05/2015, 4:19 PM

## 2015-02-05 NOTE — Patient Instructions (Signed)
Medication Instructions:   Your physician recommends that you continue on your current medications as directed. Please refer to the Current Medication list given to you today.  Labwork:   Testing/Procedures:  Your physician has requested that you have a lexiscan myoview. For further information please visit [email or web address]. Please follow instruction sheet, as given.    Follow-Up:  IN 2 TO 3 MONTHS WITH DR Angelena Form   Any Other Special Instructions Will Be Listed Below (If Applicable).

## 2015-02-06 ENCOUNTER — Other Ambulatory Visit: Payer: Self-pay | Admitting: *Deleted

## 2015-02-06 MED ORDER — NITROGLYCERIN 0.4 MG SL SUBL: 0.4000 mg | SUBLINGUAL_TABLET | SUBLINGUAL | Status: DC | PRN

## 2015-02-06 NOTE — Telephone Encounter (Signed)
Patient called and was upset that his rx for nitro was not sent in yesterday at his visit with Ignacia Bayley. He stated that he was assured more that once before he left our office that it had been phoned into [facility]. I do not see where this was done and the pharmacy does not have an updated rx. I will send this in for him.

## 2015-02-16 ENCOUNTER — Telehealth (HOSPITAL_COMMUNITY): Payer: Self-pay | Admitting: *Deleted

## 2015-02-16 DIAGNOSIS — R1010 Upper abdominal pain, unspecified: Secondary | ICD-10-CM | POA: Insufficient documentation

## 2015-02-16 DIAGNOSIS — IMO0002 Reserved for concepts with insufficient information to code with codable children: Secondary | ICD-10-CM | POA: Insufficient documentation

## 2015-02-16 NOTE — Telephone Encounter (Signed)
Patient given detailed instructions per Myocardial Perfusion Study Information Sheet for test on 02/17/15 at 1145 Patient verbalized understanding. Caren Griffins Luellen Howson,RN

## 2015-02-17 ENCOUNTER — Ambulatory Visit (HOSPITAL_COMMUNITY): Payer: 59 | Attending: Cardiovascular Disease

## 2015-02-17 DIAGNOSIS — R079 Chest pain, unspecified: Secondary | ICD-10-CM | POA: Diagnosis not present

## 2015-02-17 LAB — MYOCARDIAL PERFUSION IMAGING
CHL CUP RESTING HR STRESS: 62 {beats}/min
CHL CUP STRESS STAGE 1 GRADE: 0 %
CHL CUP STRESS STAGE 1 HR: 63 {beats}/min
CHL CUP STRESS STAGE 1 SPEED: 0 mph
CHL CUP STRESS STAGE 2 GRADE: 0 %
CHL CUP STRESS STAGE 2 HR: 63 {beats}/min
CHL CUP STRESS STAGE 4 SPEED: 0 mph
CHL CUP STRESS STAGE 5 DBP: 77 mmHg
CHL CUP STRESS STAGE 5 HR: 70 {beats}/min
CHL CUP STRESS STAGE 6 DBP: 81 mmHg
Estimated workload: 1 METS
LV dias vol: 102 mL
LV sys vol: 35 mL
Nuc Stress EF: 66 %
Peak HR: 71 {beats}/min
Percent of predicted max HR: 44 %
RATE: 0.22
SDS: 4
SRS: 6
SSS: 10
Stage 1 DBP: 83 mmHg
Stage 1 SBP: 125 mmHg
Stage 2 Speed: 0 mph
Stage 3 DBP: 89 mmHg
Stage 3 Grade: 0 %
Stage 3 HR: 71 {beats}/min
Stage 3 SBP: 126 mmHg
Stage 3 Speed: 0 mph
Stage 4 Grade: 0 %
Stage 4 HR: 71 {beats}/min
Stage 5 Grade: 0 %
Stage 5 SBP: 123 mmHg
Stage 5 Speed: 0 mph
Stage 6 Grade: 0 %
Stage 6 HR: 68 {beats}/min
Stage 6 SBP: 135 mmHg
Stage 6 Speed: 0 mph
TID: 1.02

## 2015-02-17 MED ORDER — REGADENOSON 0.4 MG/5ML IV SOLN
0.4000 mg | Freq: Once | INTRAVENOUS | Status: AC
  Administered 2015-02-17: 0.4 mg via INTRAVENOUS

## 2015-02-17 MED ORDER — TECHNETIUM TC 99M SESTAMIBI GENERIC - CARDIOLITE
33.0000 | Freq: Once | INTRAVENOUS | Status: AC | PRN
  Administered 2015-02-17: 33 via INTRAVENOUS

## 2015-02-17 MED ORDER — TECHNETIUM TC 99M SESTAMIBI GENERIC - CARDIOLITE
11.0000 | Freq: Once | INTRAVENOUS | Status: AC | PRN
  Administered 2015-02-17: 11 via INTRAVENOUS

## 2015-02-20 ENCOUNTER — Other Ambulatory Visit: Payer: Self-pay

## 2015-02-20 ENCOUNTER — Encounter: Payer: Self-pay | Admitting: *Deleted

## 2015-02-20 ENCOUNTER — Encounter: Payer: Self-pay | Admitting: Cardiovascular Disease

## 2015-02-20 ENCOUNTER — Ambulatory Visit (INDEPENDENT_AMBULATORY_CARE_PROVIDER_SITE_OTHER): Payer: 59 | Admitting: Cardiovascular Disease

## 2015-02-20 VITALS — BP 136/90 | HR 71 | Ht 71.0 in | Wt 224.8 lb

## 2015-02-20 DIAGNOSIS — E785 Hyperlipidemia, unspecified: Secondary | ICD-10-CM

## 2015-02-20 DIAGNOSIS — I10 Essential (primary) hypertension: Secondary | ICD-10-CM

## 2015-02-20 DIAGNOSIS — I739 Peripheral vascular disease, unspecified: Secondary | ICD-10-CM

## 2015-02-20 DIAGNOSIS — I25110 Atherosclerotic heart disease of native coronary artery with unstable angina pectoris: Secondary | ICD-10-CM

## 2015-02-20 DIAGNOSIS — I779 Disorder of arteries and arterioles, unspecified: Secondary | ICD-10-CM | POA: Diagnosis not present

## 2015-02-20 DIAGNOSIS — F17201 Nicotine dependence, unspecified, in remission: Secondary | ICD-10-CM

## 2015-02-20 LAB — CBC WITH DIFFERENTIAL/PLATELET
BASOS ABS: 0 10*3/uL (ref 0.0–0.1)
Basophils Relative: 0.6 % (ref 0.0–3.0)
EOS ABS: 0.2 10*3/uL (ref 0.0–0.7)
Eosinophils Relative: 3.1 % (ref 0.0–5.0)
HCT: 45.1 % (ref 39.0–52.0)
Hemoglobin: 15.3 g/dL (ref 13.0–17.0)
LYMPHS PCT: 26.3 % (ref 12.0–46.0)
Lymphs Abs: 1.3 10*3/uL (ref 0.7–4.0)
MCHC: 33.9 g/dL (ref 30.0–36.0)
MCV: 87.7 fl (ref 78.0–100.0)
Monocytes Absolute: 0.4 10*3/uL (ref 0.1–1.0)
Monocytes Relative: 7.5 % (ref 3.0–12.0)
NEUTROS PCT: 62.5 % (ref 43.0–77.0)
Neutro Abs: 3.2 10*3/uL (ref 1.4–7.7)
PLATELETS: 159 10*3/uL (ref 150.0–400.0)
RBC: 5.15 Mil/uL (ref 4.22–5.81)
RDW: 14.6 % (ref 11.5–15.5)
WBC: 5 10*3/uL (ref 4.0–10.5)

## 2015-02-20 LAB — BASIC METABOLIC PANEL
BUN: 13 mg/dL (ref 6–23)
CHLORIDE: 103 meq/L (ref 96–112)
CO2: 32 mEq/L (ref 19–32)
Calcium: 9.8 mg/dL (ref 8.4–10.5)
Creatinine, Ser: 1.17 mg/dL (ref 0.40–1.50)
GFR: 67.55 mL/min (ref >60.00)
Glucose, Bld: 86 mg/dL (ref 70–99)
Potassium: 4.2 mEq/L (ref 3.5–5.1)
Sodium: 140 mEq/L (ref 135–145)

## 2015-02-20 LAB — PROTIME-INR
INR: 0.9 ratio (ref 0.8–1.0)
Prothrombin Time: 10.1 s (ref 9.6–13.1)

## 2015-02-20 NOTE — Patient Instructions (Addendum)
Medication Instructions:  Your physician recommends that you continue on your current medications as directed. Please refer to the Current Medication list given to you today.   Labwork: Lab work to be done today--BMP, CBC, PT  Testing/Procedures:  Your physician has requested that you have a cardiac catheterization. Cardiac catheterization is used to diagnose and/or treat various heart conditions. Doctors may recommend this procedure for a number of different reasons. The most common reason is to evaluate chest pain. Chest pain can be a symptom of coronary artery disease (CAD), and cardiac catheterization can show whether plaque is narrowing or blocking your heart's arteries. This procedure is also used to evaluate the valves, as well as measure the blood flow and oxygen levels in different parts of your heart. For further information please visit [email or web address]. Please follow instruction sheet, as given. Scheduled for March 04, 2015  Your physician has requested that you have a carotid duplex. This test is an ultrasound of the carotid arteries in your neck. It looks at blood flow through these arteries that supply the brain with blood. Allow one hour for this exam. There are no restrictions or special instructions.    Follow-Up: Your physician recommends that you schedule a follow-up appointment in: early July with NP or PA.     Marland Kitchen

## 2015-02-20 NOTE — Progress Notes (Signed)
Chief Complaint  Patient presents with  . Chest Pain   History of Present Illness: 59 y.o. male with history of CAD, HTN, HLD, chronic neck and shoulder pain who is here today for cardiac follow up. He was admitted March 2014 with chest pain. LHC 12/05/12: Ostial LAD 20%, proximal LAD 50%, ostial OM1 30%, superior branch of OM2 ostial to proximal 90%, smaller inferior branch proximal 70-80%, non-dominant RCA with diffuse moderate to severe disease throughout the proximal two thirds. I performed PCI with placement of a Promus Premier DES in the superior branch of the OM2. Medical therapy recommended for the RCA which is a small nondominant vessel. Echocardiogram 12/04/12: Mild LVH, EF 40-81%, grade 1 diastolic dysfunction. Readmitted 01/16/13 with chest pain and repeat cath stable with patent stent. Full pulmonary workup was overall ok.  He has seen a Hematologist for thrombocytopenia and workup was ok. He was seen her in our office Feb 05, 2015 by Ignacia Bayley, NP and had c/o diaphoresis, left sided chest pain that was reproducible with movement. Stress myoview 02/17/15 was an intermediate risk study with inferolateral wall defect concerning for ischemia.   He is here today for follow up. He continues to have fatigue and chest pain. No syncope, orthopnea, PND or significant pedal edema. He has not tolerated beta blockers.   Primary Care Physician: Antony Contras and Sharilyn Sites  Lipid profile: Followed in primary care.  Past Medical History  Diagnosis Date  . Renal disorder     renal calculi  . Stomach pain     chronic  . CAD (coronary artery disease)     a. 12/2012 Cath/PCI: DES to superior branch of OM2;  b. Repeat cath 4/14 with 20% LAD, patent stent OM3, 70% diffuse stenosis small, non-dominant RCA.  Marland Kitchen HLD (hyperlipidemia)   . Essential hypertension   . Abdominal pain 07-11-13    ongoing and unspecified  . Arthritis   . Bladder stone 07-11-13    surgery planned    Past Surgical History    Procedure Laterality Date  . Back surgery      2 cervical/ 2 lumbar fusions  . Coronary stent placement      3'14  . Knee arthroscopy Left     '80's  . Cholecystectomy      laparoscopic.  Marland Kitchen Esophagogastroduodenoscopy endoscopy      multiple times-[facility]/ and now being followed by Endo- [facility]  . Left heart catheterization with coronary angiogram N/A 12/05/2012    Procedure: LEFT HEART CATHETERIZATION WITH CORONARY ANGIOGRAM;  Surgeon: Birdie Riddle, MD;  Location: [facility] CATH LAB;  Service: Cardiovascular;  Laterality: N/A;  . Percutaneous coronary stent intervention (pci-s) N/A 12/06/2012    Procedure: PERCUTANEOUS CORONARY STENT INTERVENTION (PCI-S);  Surgeon: Burnell Blanks, MD;  Location: [facility] CATH LAB;  Service: Cardiovascular;  Laterality: N/A;  . Left heart catheterization with coronary angiogram N/A 01/17/2013    Procedure: LEFT HEART CATHETERIZATION WITH CORONARY ANGIOGRAM;  Surgeon: Wellington Hampshire, MD;  Location: [facility] CATH LAB;  Service: Cardiovascular;  Laterality: N/A;    Current Outpatient Prescriptions  Medication Sig Dispense Refill  . aspirin 81 MG EC tablet Take 81 mg by mouth daily. Swallow whole.    . cyclobenzaprine (FLEXERIL) 10 MG tablet Take 10 mg by mouth 3 (three) times daily as needed for muscle spasms.    Marland Kitchen dicyclomine (BENTYL) 20 MG tablet Take 20 mg by mouth 4 (four) times daily as needed (IBS).     Marland Kitchen docusate  sodium (COLACE) 100 MG capsule Take 100 mg by mouth daily.     . nitroGLYCERIN (NITROSTAT) 0.4 MG SL tablet Place 1 tablet (0.4 mg total) under the tongue every 5 (five) minutes as needed for chest pain. 25 tablet 5  . olmesartan (BENICAR) 40 MG tablet Take 40 mg by mouth daily.    Marland Kitchen oxyCODONE-acetaminophen (PERCOCET/ROXICET) 5-325 MG per tablet Take 1 tablet by mouth every 4 (four) hours as needed for pain.    . pantoprazole (PROTONIX) 40 MG tablet Take 40 mg by mouth 2 (two) times daily.     . Probiotic Product (PROBIOTIC DAILY PO) Take 1 tablet  by mouth daily.    Marland Kitchen senna (SENOKOT) 8.6 MG TABS Take 2 tablets by mouth at bedtime.     . sildenafil (VIAGRA) 100 MG tablet Take 100 mg by mouth daily as needed for erectile dysfunction.    . simvastatin (ZOCOR) 40 MG tablet Take 20 mg by mouth at bedtime. Patient taking one half tablet (20 mg) by mouth every evening    . sucralfate (CARAFATE) 1 GM/10ML suspension Take 1 g by mouth 4 (four) times daily -  with meals and at bedtime.    . fluticasone (FLONASE) 50 MCG/ACT nasal spray Place 2 sprays into both nostrils daily.     No current facility-administered medications for this visit.    Allergies  Allergen Reactions  . Latex Hives and Rash    When at dentist    History   Social History  . Marital Status: Married    Spouse Name: N/A  . Number of Children: N/A  . Years of Education: N/A   Occupational History  . Retired    Social History Main Topics  . Smoking status: Former Smoker -- 0.50 packs/day for 30 years    Types: Cigarettes    Start date: 12/24/1976    Quit date: 12/04/2012  . Smokeless tobacco: Not on file  . Alcohol Use: No  . Drug Use: No  . Sexual Activity: Not on file   Other Topics Concern  . Not on file   Social History Narrative    Family History  Problem Relation Age of Onset  . Heart disease Brother   . Stomach cancer Mother   . Congestive Heart Failure Father     Review of Systems:  As stated in the HPI and otherwise negative.   BP 136/90 mmHg  Pulse 71  Ht 5\' 11"  (1.803 m)  Wt 224 lb 12.8 oz (101.969 kg)  BMI 31.37 kg/m2  Physical Examination: General: Well developed, well nourished, NAD HEENT: OP clear, mucus membranes moist SKIN: warm, dry. No rashes. Neuro: No focal deficits Musculoskeletal: Muscle strength 5/5 all ext Psychiatric: Mood and affect normal Neck: No JVD, no carotid bruits, no thyromegaly, no lymphadenopathy. Lungs:Clear bilaterally, no wheezes, rhonci, crackles Cardiovascular: Regular rate and rhythm. No murmurs,  gallops or rubs. Abdomen:Soft. Bowel sounds present. Non-tender.  Extremities: No lower extremity edema. Pulses are 2 + in the bilateral DP/PT.  Stress myoview 02/17/15:  Myocardial perfusion is abnormal. Moderate-sized, mild intensity inferolateral wall perfusion defect that is partially revesrible. Findings consistent with ischemia. This is an intermediate risk study. Overall left ventricular systolic function was normal. LV cavity size is normal. The left ventricular ejection fraction is hyperdynamic (>65%). There is no prior study for comparison.  EKG:  EKG is not ordered today. The ekg ordered today demonstrates   Recent Labs: No results found for requested labs within last 365 days.  Lipid Panel No results found for: CHOL, TRIG, HDL, CHOLHDL, VLDL, LDLCALC, LDLDIRECT   Wt Readings from Last 3 Encounters:  02/20/15 224 lb 12.8 oz (101.969 kg)  02/17/15 219 lb (99.338 kg)  02/05/15 220 lb (99.791 kg)     Other studies Reviewed: Additional studies/ records that were reviewed today include: . Review of the above records demonstrates:    Assessment and Plan:   1. CAD/unstable angina: Recent chest pains c/w unstable angina and abnormal stress myoview.  NSTEMI in March 2014 with DES OM and repeat cath April 2014 with stable disease. Will arrange cardiac cath with possible PCI at [facility] 03/04/15. Risks and benefits reviewed with pt. He agrees to proceed. Pre-cath labs today. Of note, Plavix stopped due to bruising last year. He is on ASA. He did not tolerate beta blockers.   2. HTN: BP controlled. No changes  3. HLD: Continue statin. Lipids checked recently in primary care. Reviewed by me today and LDL is under 100. Goal under 70.   4. 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   5. Carotid artery disease: Known to have mild disease by screening dopplers 2 years ago. Repeat carotids now  Current medicines are reviewed at length with the patient today.  The patient does  not have concerns regarding medicines.  The following changes have been made:  no change  Labs/ tests ordered today include:   Orders Placed This Encounter  Procedures  . CBC w/Diff  . Basic Metabolic Panel (BMET)  . INR/PT    Disposition:   FU with me in 4  weeks  Signed, Lauree Chandler, MD 02/20/2015 12:54 PM    [facility] [place], [place], [place]  [address] Phone: [phone]; Fax: [phone]

## 2015-03-04 ENCOUNTER — Encounter (HOSPITAL_COMMUNITY)
Admission: RE | Disposition: A | Discharge status: Home / Self Care | Payer: 59 | Source: Ambulatory Visit | Attending: Cardiovascular Disease

## 2015-03-04 ENCOUNTER — Ambulatory Visit (HOSPITAL_COMMUNITY)
Admission: RE | Admit: 2015-03-04 | Discharge: 2015-03-04 | Disposition: A | Discharge status: Home / Self Care | Payer: 59 | Source: Ambulatory Visit | Attending: Cardiovascular Disease | Admitting: Cardiovascular Disease

## 2015-03-04 DIAGNOSIS — I25110 Atherosclerotic heart disease of native coronary artery with unstable angina pectoris: Secondary | ICD-10-CM | POA: Diagnosis not present

## 2015-03-04 DIAGNOSIS — Z87891 Personal history of nicotine dependence: Secondary | ICD-10-CM | POA: Insufficient documentation

## 2015-03-04 DIAGNOSIS — Z7982 Long term (current) use of aspirin: Secondary | ICD-10-CM | POA: Diagnosis not present

## 2015-03-04 DIAGNOSIS — Z9049 Acquired absence of other specified parts of digestive tract: Secondary | ICD-10-CM | POA: Diagnosis not present

## 2015-03-04 DIAGNOSIS — Z955 Presence of coronary angioplasty implant and graft: Secondary | ICD-10-CM | POA: Diagnosis not present

## 2015-03-04 DIAGNOSIS — M1990 Unspecified osteoarthritis, unspecified site: Secondary | ICD-10-CM | POA: Insufficient documentation

## 2015-03-04 DIAGNOSIS — Z87442 Personal history of urinary calculi: Secondary | ICD-10-CM | POA: Diagnosis not present

## 2015-03-04 DIAGNOSIS — I2582 Chronic total occlusion of coronary artery: Secondary | ICD-10-CM | POA: Insufficient documentation

## 2015-03-04 DIAGNOSIS — Z91040 Latex allergy status: Secondary | ICD-10-CM | POA: Diagnosis not present

## 2015-03-04 DIAGNOSIS — I25119 Atherosclerotic heart disease of native coronary artery with unspecified angina pectoris: Secondary | ICD-10-CM | POA: Diagnosis present

## 2015-03-04 DIAGNOSIS — R9439 Abnormal result of other cardiovascular function study: Secondary | ICD-10-CM | POA: Insufficient documentation

## 2015-03-04 DIAGNOSIS — E785 Hyperlipidemia, unspecified: Secondary | ICD-10-CM | POA: Diagnosis not present

## 2015-03-04 DIAGNOSIS — I10 Essential (primary) hypertension: Secondary | ICD-10-CM | POA: Diagnosis not present

## 2015-03-04 HISTORY — PX: CARDIAC CATHETERIZATION: SHX172

## 2015-03-04 SURGERY — LEFT HEART CATH AND CORONARY ANGIOGRAPHY
Anesthesia: LOCAL

## 2015-03-04 MED ORDER — VERAPAMIL HCL 2.5 MG/ML IV SOLN
INTRAVENOUS | Status: DC | PRN
  Administered 2015-03-04: 10:00:00 via INTRA_ARTERIAL

## 2015-03-04 MED ORDER — OXYCODONE-ACETAMINOPHEN 5-325 MG PO TABS
2.0000 | ORAL_TABLET | ORAL | Status: AC
  Administered 2015-03-04: 2 via ORAL

## 2015-03-04 MED ORDER — SODIUM CHLORIDE 0.9 % IJ SOLN: 3.0000 mL | Freq: Two times a day (BID) | INTRAMUSCULAR | Status: DC

## 2015-03-04 MED ORDER — VERAPAMIL HCL 2.5 MG/ML IV SOLN
INTRAVENOUS | Status: AC
  Filled 2015-03-04: qty 2

## 2015-03-04 MED ORDER — HEPARIN SODIUM (PORCINE) 1000 UNIT/ML IJ SOLN
INTRAMUSCULAR | Status: DC | PRN
  Administered 2015-03-04: 5000 [IU] via INTRAVENOUS

## 2015-03-04 MED ORDER — IOHEXOL 350 MG/ML SOLN
INTRAVENOUS | Status: DC | PRN
  Administered 2015-03-04: 85 mL via INTRAVENOUS

## 2015-03-04 MED ORDER — SODIUM CHLORIDE 0.9 % IV SOLN: 250.0000 mL | INTRAVENOUS | Status: DC | PRN

## 2015-03-04 MED ORDER — LIDOCAINE HCL (PF) 1 % IJ SOLN
INTRAMUSCULAR | Status: AC
  Filled 2015-03-04: qty 30

## 2015-03-04 MED ORDER — NITROGLYCERIN 1 MG/10 ML FOR IR/CATH LAB
INTRA_ARTERIAL | Status: AC
Start: 2015-03-04 — End: 2015-03-04
  Filled 2015-03-04: qty 10

## 2015-03-04 MED ORDER — SODIUM CHLORIDE 0.9 % IJ SOLN: 3.0000 mL | INTRAMUSCULAR | Status: DC | PRN

## 2015-03-04 MED ORDER — MIDAZOLAM HCL 2 MG/2ML IJ SOLN
INTRAMUSCULAR | Status: DC | PRN
  Administered 2015-03-04: 2 mg via INTRAVENOUS
  Administered 2015-03-04: 1 mg via INTRAVENOUS

## 2015-03-04 MED ORDER — FENTANYL CITRATE (PF) 100 MCG/2ML IJ SOLN
INTRAMUSCULAR | Status: DC | PRN
  Administered 2015-03-04: 25 ug via INTRAVENOUS
  Administered 2015-03-04: 50 ug via INTRAVENOUS

## 2015-03-04 MED ORDER — OXYCODONE-ACETAMINOPHEN 5-325 MG PO TABS
ORAL_TABLET | ORAL | Status: AC
  Administered 2015-03-04: 2 via ORAL
  Filled 2015-03-04: qty 2

## 2015-03-04 MED ORDER — ASPIRIN 81 MG PO CHEW: 81.0000 mg | CHEWABLE_TABLET | ORAL | Status: DC

## 2015-03-04 MED ORDER — MIDAZOLAM HCL 2 MG/2ML IJ SOLN
INTRAMUSCULAR | Status: AC
  Filled 2015-03-04: qty 2

## 2015-03-04 MED ORDER — SODIUM CHLORIDE 0.9 % IV SOLN: INTRAVENOUS | Status: AC

## 2015-03-04 MED ORDER — SODIUM CHLORIDE 0.9 % IV SOLN
INTRAVENOUS | Status: AC
  Administered 2015-03-04: 09:00:00 via INTRAVENOUS

## 2015-03-04 MED ORDER — FENTANYL CITRATE (PF) 100 MCG/2ML IJ SOLN
INTRAMUSCULAR | Status: AC
  Filled 2015-03-04: qty 2

## 2015-03-04 MED ORDER — HEPARIN SODIUM (PORCINE) 1000 UNIT/ML IJ SOLN
INTRAMUSCULAR | Status: AC
  Filled 2015-03-04: qty 1

## 2015-03-04 MED ORDER — HEPARIN (PORCINE) IN NACL 2-0.9 UNIT/ML-% IJ SOLN
INTRAMUSCULAR | Status: AC
  Filled 2015-03-04: qty 1000

## 2015-03-04 SURGICAL SUPPLY — 14 items

## 2015-03-04 NOTE — Discharge Instructions (Signed)
Radial Site Care °Refer to this sheet in the next few weeks. These instructions provide you with information on caring for yourself after your procedure. Your caregiver may also give you more specific instructions. Your treatment has been planned according to current medical practices, but problems sometimes occur. Call your caregiver if you have any problems or questions after your procedure. °HOME CARE INSTRUCTIONS °· You may shower the day after the procedure. Remove the bandage (dressing) and gently wash the site with plain soap and water. Gently pat the site dry. °· Do not apply powder or lotion to the site. °· Do not submerge the affected site in water for 3 to 5 days. °· Inspect the site at least twice daily. °· Do not flex or bend the affected arm for 24 hours. °· No lifting over 5 pounds (2.3 kg) for 5 days after your procedure. °· Do not drive home if you are discharged the same day of the procedure. Have someone else drive you. °· You may drive 24 hours after the procedure unless otherwise instructed by your caregiver. °· Do not operate machinery or power tools for 24 hours. °· A responsible adult should be with you for the first 24 hours after you arrive home. °What to expect: °· Any bruising will usually fade within 1 to 2 weeks. °· Blood that collects in the tissue (hematoma) may be painful to the touch. It should usually decrease in size and tenderness within 1 to 2 weeks. °SEEK IMMEDIATE MEDICAL CARE IF: °· You have unusual pain at the radial site. °· You have redness, warmth, swelling, or pain at the radial site. °· You have drainage (other than a small amount of blood on the dressing). °· You have chills. °· You have a fever or persistent symptoms for more than 72 hours. °· You have a fever and your symptoms suddenly get worse. °· Your arm becomes pale, cool, tingly, or numb. °· You have heavy bleeding from the site. Hold pressure on the site. CALL 911 °Document Released: 10/22/2010 Document  Revised: 12/12/2011 Document Reviewed: 10/22/2010 °ExitCare® Patient Information ©2015 [employer]. This information is not intended to replace advice given to you by your health care provider. Make sure you discuss any questions you have with your health care provider. ° °

## 2015-03-04 NOTE — H&P (View-Only) (Signed)
Chief Complaint  Patient presents with  . Chest Pain   History of Present Illness: 59 y.o. male with history of CAD, HTN, HLD, chronic neck and shoulder pain who is here today for cardiac follow up. He was admitted March 2014 with chest pain. LHC 12/05/12: Ostial LAD 20%, proximal LAD 50%, ostial OM1 30%, superior branch of OM2 ostial to proximal 90%, smaller inferior branch proximal 70-80%, non-dominant RCA with diffuse moderate to severe disease throughout the proximal two thirds. I performed PCI with placement of a Promus Premier DES in the superior branch of the OM2. Medical therapy recommended for the RCA which is a small nondominant vessel. Echocardiogram 12/04/12: Mild LVH, EF 14-48%, grade 1 diastolic dysfunction. Readmitted 01/16/13 with chest pain and repeat cath stable with patent stent. Full pulmonary workup was overall ok.  He has seen a Hematologist for thrombocytopenia and workup was ok. He was seen her in our office Feb 05, 2015 by Ignacia Bayley, NP and had c/o diaphoresis, left sided chest pain that was reproducible with movement. Stress myoview 02/17/15 was an intermediate risk study with inferolateral wall defect concerning for ischemia.   He is here today for follow up. He continues to have fatigue and chest pain. No syncope, orthopnea, PND or significant pedal edema. He has not tolerated beta blockers.   Primary Care Physician: Antony Contras and Sharilyn Sites  Lipid profile: Followed in primary care.  Past Medical History  Diagnosis Date  . Renal disorder     renal calculi  . Stomach pain     chronic  . CAD (coronary artery disease)     a. 12/2012 Cath/PCI: DES to superior branch of OM2;  b. Repeat cath 4/14 with 20% LAD, patent stent OM3, 70% diffuse stenosis small, non-dominant RCA.  Marland Kitchen HLD (hyperlipidemia)   . Essential hypertension   . Abdominal pain 07-11-13    ongoing and unspecified  . Arthritis   . Bladder stone 07-11-13    surgery planned    Past Surgical History    Procedure Laterality Date  . Back surgery      2 cervical/ 2 lumbar fusions  . Coronary stent placement      3'14  . Knee arthroscopy Left     '80's  . Cholecystectomy      laparoscopic.  Marland Kitchen Esophagogastroduodenoscopy endoscopy      multiple times-[facility]/ and now being followed by Endo- MD [facility]  . Left heart catheterization with coronary angiogram N/A 12/05/2012    Procedure: LEFT HEART CATHETERIZATION WITH CORONARY ANGIOGRAM;  Surgeon: Birdie Riddle, MD;  Location: [facility] CATH LAB;  Service: Cardiovascular;  Laterality: N/A;  . Percutaneous coronary stent intervention (pci-s) N/A 12/06/2012    Procedure: PERCUTANEOUS CORONARY STENT INTERVENTION (PCI-S);  Surgeon: Burnell Blanks, MD;  Location: [facility] CATH LAB;  Service: Cardiovascular;  Laterality: N/A;  . Left heart catheterization with coronary angiogram N/A 01/17/2013    Procedure: LEFT HEART CATHETERIZATION WITH CORONARY ANGIOGRAM;  Surgeon: Wellington Hampshire, MD;  Location: [facility] CATH LAB;  Service: Cardiovascular;  Laterality: N/A;    Current Outpatient Prescriptions  Medication Sig Dispense Refill  . aspirin 81 MG EC tablet Take 81 mg by mouth daily. Swallow whole.    . cyclobenzaprine (FLEXERIL) 10 MG tablet Take 10 mg by mouth 3 (three) times daily as needed for muscle spasms.    Marland Kitchen dicyclomine (BENTYL) 20 MG tablet Take 20 mg by mouth 4 (four) times daily as needed (IBS).     Marland Kitchen docusate  sodium (COLACE) 100 MG capsule Take 100 mg by mouth daily.     . nitroGLYCERIN (NITROSTAT) 0.4 MG SL tablet Place 1 tablet (0.4 mg total) under the tongue every 5 (five) minutes as needed for chest pain. 25 tablet 5  . olmesartan (BENICAR) 40 MG tablet Take 40 mg by mouth daily.    Marland Kitchen oxyCODONE-acetaminophen (PERCOCET/ROXICET) 5-325 MG per tablet Take 1 tablet by mouth every 4 (four) hours as needed for pain.    . pantoprazole (PROTONIX) 40 MG tablet Take 40 mg by mouth 2 (two) times daily.     . Probiotic Product (PROBIOTIC DAILY PO) Take 1 tablet  by mouth daily.    Marland Kitchen senna (SENOKOT) 8.6 MG TABS Take 2 tablets by mouth at bedtime.     . sildenafil (VIAGRA) 100 MG tablet Take 100 mg by mouth daily as needed for erectile dysfunction.    . simvastatin (ZOCOR) 40 MG tablet Take 20 mg by mouth at bedtime. Patient taking one half tablet (20 mg) by mouth every evening    . sucralfate (CARAFATE) 1 GM/10ML suspension Take 1 g by mouth 4 (four) times daily -  with meals and at bedtime.    . fluticasone (FLONASE) 50 MCG/ACT nasal spray Place 2 sprays into both nostrils daily.     No current facility-administered medications for this visit.    Allergies  Allergen Reactions  . Latex Hives and Rash    When at dentist    History   Social History  . Marital Status: Married    Spouse Name: N/A  . Number of Children: N/A  . Years of Education: N/A   Occupational History  . Retired    Social History Main Topics  . Smoking status: Former Smoker -- 0.50 packs/day for 30 years    Types: Cigarettes    Start date: 12/24/1976    Quit date: 12/04/2012  . Smokeless tobacco: Not on file  . Alcohol Use: No  . Drug Use: No  . Sexual Activity: Not on file   Other Topics Concern  . Not on file   Social History Narrative    Family History  Problem Relation Age of Onset  . Heart disease Brother   . Stomach cancer Mother   . Congestive Heart Failure Father     Review of Systems:  As stated in the HPI and otherwise negative.   BP 136/90 mmHg  Pulse 71  Ht 5\' 11"  (1.803 m)  Wt 224 lb 12.8 oz (101.969 kg)  BMI 31.37 kg/m2  Physical Examination: General: Well developed, well nourished, NAD HEENT: OP clear, mucus membranes moist SKIN: warm, dry. No rashes. Neuro: No focal deficits Musculoskeletal: Muscle strength 5/5 all ext Psychiatric: Mood and affect normal Neck: No JVD, no carotid bruits, no thyromegaly, no lymphadenopathy. Lungs:Clear bilaterally, no wheezes, rhonci, crackles Cardiovascular: Regular rate and rhythm. No murmurs,  gallops or rubs. Abdomen:Soft. Bowel sounds present. Non-tender.  Extremities: No lower extremity edema. Pulses are 2 + in the bilateral DP/PT.  Stress myoview 02/17/15:  Myocardial perfusion is abnormal. Moderate-sized, mild intensity inferolateral wall perfusion defect that is partially revesrible. Findings consistent with ischemia. This is an intermediate risk study. Overall left ventricular systolic function was normal. LV cavity size is normal. The left ventricular ejection fraction is hyperdynamic (>65%). There is no prior study for comparison.  EKG:  EKG is not ordered today. The ekg ordered today demonstrates   Recent Labs: No results found for requested labs within last 365 days.  Lipid Panel No results found for: CHOL, TRIG, HDL, CHOLHDL, VLDL, LDLCALC, LDLDIRECT   Wt Readings from Last 3 Encounters:  02/20/15 224 lb 12.8 oz (101.969 kg)  02/17/15 219 lb (99.338 kg)  02/05/15 220 lb (99.791 kg)     Other studies Reviewed: Additional studies/ records that were reviewed today include: . Review of the above records demonstrates:    Assessment and Plan:   1. CAD/unstable angina: Recent chest pains c/w unstable angina and abnormal stress myoview.  NSTEMI in March 2014 with DES OM and repeat cath April 2014 with stable disease. Will arrange cardiac cath with possible PCI at [facility] 03/04/15. Risks and benefits reviewed with pt. He agrees to proceed. Pre-cath labs today. Of note, Plavix stopped due to bruising last year. He is on ASA. He did not tolerate beta blockers.   2. HTN: BP controlled. No changes  3. HLD: Continue statin. Lipids checked recently in primary care. Reviewed by me today and LDL is under 100. Goal under 70.   4. 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   5. Carotid artery disease: Known to have mild disease by screening dopplers 2 years ago. Repeat carotids now  Current medicines are reviewed at length with the patient today.  The patient does  not have concerns regarding medicines.  The following changes have been made:  no change  Labs/ tests ordered today include:   Orders Placed This Encounter  Procedures  . CBC w/Diff  . Basic Metabolic Panel (BMET)  . INR/PT    Disposition:   FU with me in 4  weeks  Signed, Lauree Chandler, MD 02/20/2015 12:54 PM    [facility] [place], [place], [place]  [address] Phone: [phone]; Fax: [phone]

## 2015-03-04 NOTE — Interval H&P Note (Signed)
History and Physical Interval Note:  03/04/2015 10:12 AM  Russell Padilla  has presented today for cardiac cath with the diagnosis of CAD, chest pain/abnormal stress test. The various methods of treatment have been discussed with the patient and family. After consideration of risks, benefits and other options for treatment, the patient has consented to  Procedure(s): Left Heart Cath and Coronary Angiography (N/A) as a surgical intervention .  The patient's history has been reviewed, patient examined, no change in status, stable for surgery.  I have reviewed the patient's chart and labs.  Questions were answered to the patient's satisfaction.    Cath Lab Visit (complete for each Cath Lab visit)  Clinical Evaluation Leading to the Procedure:   ACS: No.  Non-ACS:    Anginal Classification: CCS II  Anti-ischemic medical therapy: No Therapy  Non-Invasive Test Results: Intermediate-risk stress test findings: cardiac mortality 1-3%/year  Prior CABG: No previous CABG         [name]

## 2015-03-05 ENCOUNTER — Encounter (HOSPITAL_COMMUNITY): Payer: Self-pay | Admitting: Cardiovascular Disease

## 2015-03-05 MED FILL — Lidocaine HCl Local Preservative Free (PF) Inj 1%: INTRAMUSCULAR | Qty: 30 | Status: AC

## 2015-03-05 MED FILL — Heparin Sodium (Porcine) 2 Unit/ML in Sodium Chloride 0.9%: INTRAMUSCULAR | Qty: 1000 | Status: AC

## 2015-03-23 ENCOUNTER — Ambulatory Visit (HOSPITAL_COMMUNITY): Payer: 59 | Attending: Cardiology

## 2015-03-23 DIAGNOSIS — I779 Disorder of arteries and arterioles, unspecified: Secondary | ICD-10-CM | POA: Diagnosis present

## 2015-03-23 DIAGNOSIS — I739 Peripheral vascular disease, unspecified: Secondary | ICD-10-CM

## 2015-03-23 DIAGNOSIS — I6523 Occlusion and stenosis of bilateral carotid arteries: Secondary | ICD-10-CM | POA: Insufficient documentation

## 2015-04-12 NOTE — Progress Notes (Signed)
Cardiology Office Note   Date:  04/13/2015   ID:  Russell Padilla, DOB 1955/11/05, MRN [id number]  PCP:  Purvis Kilts, MD  Cardiologist:  Dr. Lauree Chandler     Chief Complaint  Patient presents with  . Coronary Artery Disease     History of Present Illness: Russell Padilla is a 59 y.o. male with a hx of CAD, HTN, HLD, chronic neck and shoulder pain. He was admitted March 2014 with chest pain. LHC 12/05/12: Ostial LAD 20%, proximal LAD 50%, ostial OM1 30%, superior branch of OM2 ostial to proximal 90%, smaller inferior branch proximal 70-80%, non-dominant RCA with diffuse moderate to severe disease throughout the proximal two thirds. He underwent PCI with placement of a Promus Premier DES in the superior branch of the OM2. Medical therapy recommended for the RCA which is a small nondominant vessel. Echocardiogram 12/04/12: Mild LVH, EF 67-89%, grade 1 diastolic dysfunction. Readmitted 01/16/13 with chest pain and repeat cath stable with patent stent. Full pulmonary workup was overall ok. He has seen a Hematologist for thrombocytopenia and workup was ok. He was seen her in our office Feb 05, 2015 by Ignacia Bayley, NP and had c/o diaphoresis, left sided chest pain that was reproducible with movement. Stress myoview 02/17/15 was an intermediate risk study with inferolateral wall defect concerning for ischemia.  He FU with Dr. Lauree Chandler 02/20/15.  LHC was arranged.  This was performed 03/04/15 and demonstrated stable 2 vessel CAD with patent OM2 stent, mod disease in a small non-dominant RCA (no change from last cath), mild LAD and Dx disease, severe stenosis in a very small caliber sub-branch of OM2 (jailed by previous stent and unchanged from prior cath), CTO of OM1 with L>L collats.  Med Rx was recommended.  He returns for FU.    Since his LHC, he denies any further chest symptoms.  He does get some burning in his throat when he breathes if he is outside doing a lot of work.  He denies  significant dyspnea.  No orthopnea, PND, edema.  No syncope.  No cough or wheezing.     Studies/Reports Reviewed Today:  Carotid US 03/23/15 Bilat 1-39% >> FU 2 years  LHC 03/04/15  Prox RCA lesion, 50% stenosed.  Mid RCA lesion, 50% stenosed.  Dist RCA lesion, 50% stenosed.  Prox Cx lesion, 20% stenosed.  Ost Ramus to Ramus lesion, 100% stenosed.  2nd Mrg-2 lesion, 70% stenosed.  1st Diag lesion, 30% stenosed.  Prox LAD lesion, 20% stenosed.  Prox LAD to Dist LAD lesion, 20% stenosed.  EF 55-65% 1. Stable double vessel CAD with patent obtuse marginal stent.  2. Moderate disease in the small non-dominant RCA, unchanged from last cath. 3. Mild disease in the LAD and Diagonal 4. Severe stenosis very small caliber sub-branch of OM2, jailed by stent and unchanged.  5. 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  6. Preserved LV systolic function Recommendations: Will continue medical therapy.  Myoview 02/17/15  Myocardial perfusion is abnormal. Moderate-sized, mild intensity inferolateral wall perfusion defect that is partially revesrible. Findings consistent with ischemia. This is an intermediate risk study. Overall left ventricular systolic function was normal. LV cavity size is normal. The left ventricular ejection fraction is hyperdynamic (>65%). There is no prior study for comparison.  Echo 12/05/12 Mild LVH, EF 60-65%, Gr 1 DD   Past Medical History  Diagnosis Date  . Renal disorder     renal calculi  . Stomach  pain     chronic  . CAD (coronary artery disease)     a. 12/2012 Cath/PCI: DES to superior branch of OM2;  b. Repeat cath 4/14 with 20% LAD, patent stent OM3, 70% diffuse stenosis small, non-dominant RCA.  Marland Kitchen HLD (hyperlipidemia)   . Essential hypertension   . Abdominal pain 07-11-13    ongoing and unspecified  . Arthritis   . Bladder stone 07-11-13    surgery planned    Past Surgical History  Procedure Laterality Date  . Back  surgery      2 cervical/ 2 lumbar fusions  . Coronary stent placement      3'14  . Knee arthroscopy Left     '80's  . Cholecystectomy      laparoscopic.  Marland Kitchen Esophagogastroduodenoscopy endoscopy      multiple times-[facility]/ and now being followed by Endo- [facility]  . Left heart catheterization with coronary angiogram N/A 12/05/2012    Procedure: LEFT HEART CATHETERIZATION WITH CORONARY ANGIOGRAM;  Surgeon: Birdie Riddle, MD;  Location: [facility] CATH LAB;  Service: Cardiovascular;  Laterality: N/A;  . Percutaneous coronary stent intervention (pci-s) N/A 12/06/2012    Procedure: PERCUTANEOUS CORONARY STENT INTERVENTION (PCI-S);  Surgeon: Burnell Blanks, MD;  Location: [facility] CATH LAB;  Service: Cardiovascular;  Laterality: N/A;  . Left heart catheterization with coronary angiogram N/A 01/17/2013    Procedure: LEFT HEART CATHETERIZATION WITH CORONARY ANGIOGRAM;  Surgeon: Wellington Hampshire, MD;  Location: [facility] CATH LAB;  Service: Cardiovascular;  Laterality: N/A;  . Cardiac catheterization N/A 03/04/2015    Procedure: Left Heart Cath and Coronary Angiography;  Surgeon: Burnell Blanks, MD;  Location: [facility] CV LAB;  Service: Cardiovascular;  Laterality: N/A;     Current Outpatient Prescriptions  Medication Sig Dispense Refill  . aspirin 81 MG EC tablet Take 81 mg by mouth daily. Swallow whole.    . cyclobenzaprine (FLEXERIL) 10 MG tablet Take 10 mg by mouth 3 (three) times daily as needed for muscle spasms.    Marland Kitchen dicyclomine (BENTYL) 20 MG tablet Take 20 mg by mouth 4 (four) times daily as needed (IBS).     Marland Kitchen docusate sodium (COLACE) 100 MG capsule Take 100 mg by mouth daily.     . fluticasone (FLONASE) 50 MCG/ACT nasal spray Place 2 sprays into both nostrils daily.    . Multiple Vitamins-Minerals (MULTIVITAMIN WITH MINERALS) tablet Take 1 tablet by mouth daily.    . nitroGLYCERIN (NITROSTAT) 0.4 MG SL tablet Place 1 tablet (0.4 mg total) under the tongue every 5 (five) minutes as needed for  chest pain. 25 tablet 5  . olmesartan (BENICAR) 40 MG tablet Take 40 mg by mouth daily.    Marland Kitchen oxyCODONE-acetaminophen (PERCOCET/ROXICET) 5-325 MG per tablet Take 1 tablet by mouth every 4 (four) hours as needed for pain.    . pantoprazole (PROTONIX) 40 MG tablet Take 40 mg by mouth 2 (two) times daily.     . Probiotic Product (PROBIOTIC DAILY PO) Take 1 tablet by mouth daily.    Marland Kitchen senna (SENOKOT) 8.6 MG TABS Take 2 tablets by mouth at bedtime.     . sildenafil (VIAGRA) 100 MG tablet Take 100 mg by mouth daily as needed for erectile dysfunction.    . simvastatin (ZOCOR) 40 MG tablet Take 20 mg by mouth at bedtime. Patient taking one half tablet (20 mg) by mouth every evening    . sucralfate (CARAFATE) 1 GM/10ML suspension Take 1 g by mouth 4 (four) times daily -  with meals and at bedtime.     No current facility-administered medications for this visit.    Allergies:   Latex    Social History:  The patient  reports that he quit smoking about 2 years ago. His smoking use included Cigarettes. He started smoking about 38 years ago. He has a 15 pack-year smoking history. He does not have any smokeless tobacco history on file. He reports that he does not drink alcohol or use illicit drugs.   Family History:  The patient's family history includes Congestive Heart Failure in his father; Heart disease in his brother; Stomach cancer in his mother.    ROS:   Please see the history of present illness.   Review of Systems  HENT: Positive for headaches.   Musculoskeletal: Positive for back pain and myalgias.  Gastrointestinal: Positive for abdominal pain and hematochezia.  All other systems reviewed and are negative.    PHYSICAL EXAM: VS:  BP 128/78 mmHg  Pulse 59  Ht 5' 11.45" (1.815 m)  Wt 227 lb (102.967 kg)  BMI 31.26 kg/m2  SpO2 98%    Wt Readings from Last 3 Encounters:  04/13/15 227 lb (102.967 kg)  03/04/15 222 lb (100.699 kg)  02/20/15 224 lb 12.8 oz (101.969 kg)     GEN: Well  nourished, well developed, in no acute distress HEENT: normal Neck: no JVD, no masses Cardiac:  Normal S1/S2, RRR; no murmur ,  no rubs or gallops, no edema; right wrist without hematoma or mass  Respiratory:  clear to auscultation bilaterally, no wheezing, rhonchi or rales. GI: soft, nontender, nondistended, + BS MS: no deformity or atrophy Skin: warm and dry  Neuro:  CNs II-XII intact, Strength and sensation are intact Psych: Normal affect   EKG:  EKG is not ordered today.  It demonstrates:   N/a    Recent Labs: 02/20/2015: BUN 13; Creatinine, Ser 1.17; Hemoglobin 15.3; Platelets 159.0; Potassium 4.2; Sodium 140    Lipid Panel No results found for: CHOL, TRIG, HDL, CHOLHDL, VLDL, LDLCALC, LDLDIRECT    ASSESSMENT AND PLAN:  1.  Coronary artery disease involving native coronary artery of native heart without angina pectoris:  Anatomy stable by recent cath.  No further chest symptoms.  He does have CTO of OM1 and a jailed sub-branch off the OM2 (from previous stent).  It is possible he could have some angina from these areas.  However, his symptoms do not seem to warrant a change in his anti-anginals.  Continue ASA, angiotensin receptor blocker, statin.   2.  Essential hypertension:  Controlled.  3.  HLD (hyperlipidemia):  Continue statin.  Labs are checked by PCP.     Medication Changes: Current medicines are reviewed at length with the patient today.  Concerns regarding medicines are as outlined above.  The following changes have been made:   Discontinued Medications   No medications on file   Modified Medications   No medications on file   New Prescriptions   No medications on file    Labs/ tests ordered today include:   No orders of the defined types were placed in this encounter.     Disposition:   FU with Dr. Lauree Chandler 6 mos.     Signed, Versie Starks, MHS 04/13/2015 2:31 PM    [facility] [address], [place],  [place]  [address] Phone: [phone]; Fax: [phone]

## 2015-04-13 ENCOUNTER — Ambulatory Visit (INDEPENDENT_AMBULATORY_CARE_PROVIDER_SITE_OTHER): Payer: 59 | Admitting: Physician Assistant

## 2015-04-13 ENCOUNTER — Encounter: Payer: Self-pay | Admitting: Physician Assistant

## 2015-04-13 VITALS — BP 128/78 | HR 59 | Ht 71.45 in | Wt 227.0 lb

## 2015-04-13 DIAGNOSIS — I2510 Atherosclerotic heart disease of native coronary artery without angina pectoris: Secondary | ICD-10-CM | POA: Diagnosis not present

## 2015-04-13 DIAGNOSIS — E785 Hyperlipidemia, unspecified: Secondary | ICD-10-CM | POA: Diagnosis not present

## 2015-04-13 DIAGNOSIS — I10 Essential (primary) hypertension: Secondary | ICD-10-CM

## 2015-04-13 NOTE — Patient Instructions (Signed)
Medication Instructions:  Your physician recommends that you continue on your current medications as directed. Please refer to the Current Medication list given to you today.   Labwork: NONE  Testing/Procedures: NONE  Follow-Up: Your physician wants you to follow-up in: 6 MONTHS WITH DR. MCALHANY You will receive a reminder letter in the mail two months in advance. If you don't receive a letter, please call our office to schedule the follow-up appointment.   Any Other Special Instructions Will Be Listed Below (If Applicable).   

## 2015-05-13 ENCOUNTER — Ambulatory Visit (INDEPENDENT_AMBULATORY_CARE_PROVIDER_SITE_OTHER): Payer: 59 | Admitting: Neurology

## 2015-05-13 ENCOUNTER — Encounter: Payer: Self-pay | Admitting: Neurology

## 2015-05-13 VITALS — BP 123/87 | HR 65 | Ht 71.5 in | Wt 226.0 lb

## 2015-05-13 DIAGNOSIS — R519 Headache, unspecified: Secondary | ICD-10-CM

## 2015-05-13 DIAGNOSIS — R51 Headache: Secondary | ICD-10-CM | POA: Diagnosis not present

## 2015-05-13 MED ORDER — DICLOFENAC POTASSIUM(MIGRAINE) 50 MG PO PACK
50.0000 mg | PACK | ORAL | Status: DC | PRN
Start: 2015-05-13 — End: 2015-05-26

## 2015-05-13 MED ORDER — GABAPENTIN 300 MG PO CAPS: 300.0000 mg | ORAL_CAPSULE | Freq: Three times a day (TID) | ORAL | Status: DC

## 2015-05-13 NOTE — Progress Notes (Signed)
PATIENT: Russell Padilla DOB: 1955-11-24  Chief Complaint  Patient presents with  . Headache    Reports having approximately 2-3 severe headahces per month.  He typically trys ice packs and Excedrin Migraine.  He occasionally uses oxycodone.  He has had an occipital nerve block in the past.  He sometimes has blurred vision, light and noise sensitivity     HISTORICAL  Russell Padilla is a 59 years old right-handed male, accompanied by his wife evaluation for severe headaches, his primary care physician is Dr. Sharilyn Sites.  He has past medical history of hypertension, hyperlipidemia, coronary artery disease, status post stent, kidney stone, he also had a history of cervical, lumbar fusion surgery in the past, most recent was 2005, for neck pain, radiating pain to bilateral shoulder  His left facial pain/ headache started since 2001, after a dental procedure, he had a constant left facial pain, swollen, due to side effect of local anesthesia, he had difficulty open up his mouth, has to take gabapentin for one year, his left facial pain gradually improved  But over the past few years, he continues to have occasionally flareup of his left facial deep achy pain, 3-4 times each month, very similar pain following his dental procedure, mild to moderate, lasting few hours to 1 day, Excedrin Migraine as needed has been helpful  He also has different kind of headaches since 2015, starting from his upper cervical, bilateral occipital region, spreading forward, become holocranial moderate severe pressure pounding headaches with associated light noise sensitivity, very painful, difficult to touch his pillow because the occipital area pain, hypersensitivity, not responsive to Excedrin Migraine, sometimes he has to take take Percocet, muscle relaxant Flexeril, Robaxin with limited help, lasting half to 1 day  REVIEW OF SYSTEMS: Full 14 system review of systems performed and notable only for weight gain,  blurry vision, ringing ears, feeling hot, headaches, restless leg  ALLERGIES: Allergies  Allergen Reactions  . Latex Hives and Rash    When at dentist    HOME MEDICATIONS: Current Outpatient Prescriptions  Medication Sig Dispense Refill  . aspirin 81 MG EC tablet Take 81 mg by mouth daily. Swallow whole.    . cyclobenzaprine (FLEXERIL) 10 MG tablet Take 10 mg by mouth 3 (three) times daily as needed for muscle spasms.    Marland Kitchen dicyclomine (BENTYL) 20 MG tablet Take 20 mg by mouth 4 (four) times daily as needed (IBS).     Marland Kitchen docusate sodium (COLACE) 100 MG capsule Take 100 mg by mouth daily.     . fluticasone (FLONASE) 50 MCG/ACT nasal spray Place 2 sprays into both nostrils daily.    . methocarbamol (ROBAXIN) 500 MG tablet Take 500 mg by mouth as needed for muscle spasms.    . Multiple Vitamins-Minerals (MULTIVITAMIN WITH MINERALS) tablet Take 1 tablet by mouth daily.    . nitroGLYCERIN (NITROSTAT) 0.4 MG SL tablet Place 1 tablet (0.4 mg total) under the tongue every 5 (five) minutes as needed for chest pain. 25 tablet 5  . olmesartan (BENICAR) 40 MG tablet Take 40 mg by mouth daily.    Marland Kitchen oxyCODONE-acetaminophen (PERCOCET/ROXICET) 5-325 MG per tablet Take 1 tablet by mouth every 4 (four) hours as needed for pain.    . pantoprazole (PROTONIX) 40 MG tablet Take 40 mg by mouth 2 (two) times daily.     . Probiotic Product (PROBIOTIC DAILY PO) Take 1 tablet by mouth daily.    Marland Kitchen senna (SENOKOT) 8.6 MG TABS Take  2 tablets by mouth at bedtime.     . sildenafil (VIAGRA) 100 MG tablet Take 100 mg by mouth daily as needed for erectile dysfunction.    . simvastatin (ZOCOR) 40 MG tablet Take 20 mg by mouth at bedtime. Patient taking one half tablet (20 mg) by mouth every evening    . sucralfate (CARAFATE) 1 GM/10ML suspension Take 1 g by mouth 4 (four) times daily -  with meals and at bedtime.       PAST MEDICAL HISTORY: Past Medical History  Diagnosis Date  . Renal disorder     renal calculi  .  Stomach pain     chronic  . CAD (coronary artery disease)     a. 12/2012 Cath/PCI: DES to superior branch of OM2;  b. Repeat cath 4/14 with 20% LAD, patent stent OM3, 70% diffuse stenosis small, non-dominant RCA.  Marland Kitchen HLD (hyperlipidemia)   . Essential hypertension   . Abdominal pain 07-11-13    ongoing and unspecified  . Arthritis   . Bladder stone 07-11-13    surgery planned  . Headache     PAST SURGICAL HISTORY: Past Surgical History  Procedure Laterality Date  . Back surgery      2 cervical/ 2 lumbar fusions  . Coronary stent placement      3'14  . Knee arthroscopy Left     '80's  . Cholecystectomy      laparoscopic.  Marland Kitchen Esophagogastroduodenoscopy endoscopy      multiple times-[facility]/ and now being followed by Endo- [facility]  . Left heart catheterization with coronary angiogram N/A 12/05/2012    Procedure: LEFT HEART CATHETERIZATION WITH CORONARY ANGIOGRAM;  Surgeon: Birdie Riddle, MD;  Location: [facility] CATH LAB;  Service: Cardiovascular;  Laterality: N/A;  . Percutaneous coronary stent intervention (pci-s) N/A 12/06/2012    Procedure: PERCUTANEOUS CORONARY STENT INTERVENTION (PCI-S);  Surgeon: Burnell Blanks, MD;  Location: [facility] CATH LAB;  Service: Cardiovascular;  Laterality: N/A;  . Left heart catheterization with coronary angiogram N/A 01/17/2013    Procedure: LEFT HEART CATHETERIZATION WITH CORONARY ANGIOGRAM;  Surgeon: Wellington Hampshire, MD;  Location: [facility] CATH LAB;  Service: Cardiovascular;  Laterality: N/A;  . Cardiac catheterization N/A 03/04/2015    Procedure: Left Heart Cath and Coronary Angiography;  Surgeon: Burnell Blanks, MD;  Location: [place] [facility] CV LAB;  Service: Cardiovascular;  Laterality: N/A;    FAMILY HISTORY: Family History  Problem Relation Age of Onset  . Heart disease Brother   . Stomach cancer Mother   . Congestive Heart Failure Father   . Dementia Father     SOCIAL HISTORY:  Social History   Social History  . Marital Status: Married      Spouse Name: N/A  . Number of Children: 0  . Years of Education: 14   Occupational History  . Retired    Social History Main Topics  . Smoking status: Former Smoker -- 0.50 packs/day for 30 years    Types: Cigarettes    Start date: 12/24/1976    Quit date: 12/04/2012  . Smokeless tobacco: Not on file  . Alcohol Use: No  . Drug Use: No  . Sexual Activity: Not on file   Other Topics Concern  . Not on file   Social History Narrative   Lives at home with his wife, Regino Schultze.   Right-handed.   4-5 cans diet colas and occasional glasses of tea per day.     PHYSICAL EXAM   Filed Vitals:  05/13/15 0959  BP: 123/87  Pulse: 65  Height: 5' 11.5" (1.816 m)  Weight: 226 lb (102.513 kg)    Not recorded      Body mass index is 31.08 kg/(m^2).  PHYSICAL EXAMNIATION:  Gen: NAD, conversant, well nourised, obese, well groomed                     Cardiovascular: Regular rate rhythm, no peripheral edema, warm, nontender. Eyes: Conjunctivae clear without exudates or hemorrhage Neck: Supple, no carotid bruise. Pulmonary: Clear to auscultation bilaterally   NEUROLOGICAL EXAM:  MENTAL STATUS: Speech:    Speech is normal; fluent and spontaneous with normal comprehension.  Cognition:     Orientation to time, place and person     Normal recent and remote memory     Normal Attention span and concentration     Normal Language, naming, repeating,spontaneous speech     Fund of knowledge   CRANIAL NERVES: CN II: Visual fields are full to confrontation. Fundoscopic exam is normal with sharp discs and no vascular changes. Pupils are round equal and briskly reactive to light. CN III, IV, VI: extraocular movement are normal. No ptosis. CN V: Facial sensation is intact to pinprick in all 3 divisions bilaterally. Corneal responses are intact.  CN VII: Face is symmetric with normal eye closure and smile. CN VIII: Hearing is normal to rubbing fingers CN IX, X: Palate elevates  symmetrically. Phonation is normal. CN XI: Head turning and shoulder shrug are intact CN XII: Tongue is midline with normal movements and no atrophy.  MOTOR: There is no pronator drift of out-stretched arms. Muscle bulk and tone are normal. Muscle strength is normal.  REFLEXES: Reflexes are 2+ and symmetric at the biceps, triceps, knees, and ankles. Plantar responses are flexor.  SENSORY: Intact to light touch, pinprick, position sense, and vibration sense are intact in fingers and toes.  COORDINATION: Rapid alternating movements and fine finger movements are intact. There is no dysmetria on finger-to-nose and heel-knee-shin.    GAIT/STANCE: Posture is normal. Gait is steady with normal steps, base, arm swing, and turning. Heel and toe walking are normal. Tandem gait is normal.  Romberg is absent.   DIAGNOSTIC DATA (LABS, IMAGING, TESTING) - I reviewed patient records, labs, notes, testing and imaging myself where available.   ASSESSMENT AND PLAN  Russell Padilla is a 59 y.o. male   Frequent headaches,  Has features consistent with cervicogenic headaches, and also left facial atypical pain,  Start Neurontin 300 mg 3 times a day as preventive medications  [place]  as needed  His headache has migraine features, he is not good candidate for triptan due to previous history of coronary artery disease  Return to clinic in 2 months    Marcial Pacas, M.D. Ph.D.  [facility] [address], [place], [place] [address] Ph: [phone] Fax: [phone]  CC:  Dr. Sharilyn Sites.

## 2015-05-21 ENCOUNTER — Telehealth: Payer: Self-pay | Admitting: Neurology

## 2015-05-21 NOTE — Telephone Encounter (Signed)
Ins has been contacted and provided with clinical info.  Request is currently under review Ref # [id number]  I called the patient back to advise.  He is aware.  He has taken some Excedrin Migraine, which seemed to help.

## 2015-05-21 NOTE — Telephone Encounter (Signed)
Patient is calling. He went to pick up his medication Diclofenac Potassium 50 MG PACK and [facility] in [place] says prior authorization is needed. The patient started having headaches again yesterday and continuing today. Please call and advise. Thank you.

## 2015-05-25 NOTE — Telephone Encounter (Signed)
Ins has responded stating Russell Padilla is "is a product that is excluded from the current plan, so there is no coverage criteria to review and apply."  This drug is excluded from the plan, and no exceptions can be made.  They will however, cover Diclofenac tabs.  Would you like to change Rx?  Please advise.  Thank you.

## 2015-05-25 NOTE — Telephone Encounter (Signed)
Please let patient know, he may fill Diclofenac tablets, may not as effective as powder form [place].

## 2015-05-26 MED ORDER — DICLOFENAC SODIUM 50 MG PO TBEC: 50.0000 mg | DELAYED_RELEASE_TABLET | ORAL | Status: DC | PRN

## 2015-05-26 NOTE — Telephone Encounter (Signed)
I called the patient back.  Relayed providers message.  He expressed understanding and was agreeable to this.  Says he will call back if anything further is needed.

## 2015-06-02 ENCOUNTER — Encounter: Payer: Self-pay | Admitting: Adult Health

## 2015-06-02 ENCOUNTER — Ambulatory Visit (INDEPENDENT_AMBULATORY_CARE_PROVIDER_SITE_OTHER): Payer: 59 | Admitting: Adult Health

## 2015-06-02 ENCOUNTER — Telehealth: Payer: Self-pay | Admitting: Neurology

## 2015-06-02 VITALS — BP 122/80 | HR 72 | Ht 71.0 in | Wt 230.0 lb

## 2015-06-02 DIAGNOSIS — R519 Headache, unspecified: Secondary | ICD-10-CM

## 2015-06-02 DIAGNOSIS — R51 Headache: Secondary | ICD-10-CM | POA: Diagnosis not present

## 2015-06-02 MED ORDER — GABAPENTIN 300 MG PO CAPS: ORAL_CAPSULE | ORAL | Status: DC

## 2015-06-02 NOTE — Telephone Encounter (Signed)
Noted. I will see the patient today in the office.

## 2015-06-02 NOTE — Patient Instructions (Signed)
Begin taking gabapentin 300 mg in the morning and two tablets (600 mg) at bedtime.  You may take Diclofenac 1 tablet daily as needed for severe headache. If your headache frequency does not improve or if you have severe headaches with no relief let us know.

## 2015-06-02 NOTE — Telephone Encounter (Signed)
Called and spoke to patient he is coming in and see Jinny Blossom today.

## 2015-06-02 NOTE — Progress Notes (Signed)
PATIENT: Russell Padilla DOB: Feb 11, 1956  REASON FOR VISIT: follow up- headache HISTORY FROM: patient  HISTORY OF PRESENT ILLNESS: Russell Padilla is a 59 year old male with a history of left facial pain and headaches. He returns today complaining of an ongoing headache. At the last office visit Dr. Krista Blue started him on gabapentin 300 mg 3 times a day. Patient does not feel that his headache frequency has changed since the last visit however he did have a severe headache on Sunday that lasted pretty much all day into Monday. He states that he was given diclofenac to take for acute headache. He states that on the bottle it states to take only at bedtime PRN. This was not how the medication was prescribed. He states that when he does take the diclofenac his headache does resolve. When he does get a headache it normally occurs first thing in the morning. His headaches were initially starting in the neck and radiating to the back of the head. However more recently the headaches have started in the back of the head and radiated down the neck. In the mornings his headaches can be severe enough that it causes hypersensitivity in the back of the head in the occipital region. He states that occasionally he will have the sensation that the back of his head is swollen. He describes his headaches as a pressure sensation. According to the patient many years ago he saw Dr. Rolin Barry and he did an MRI of the brain. He was told that he had a "growth" on the right side of the head in the occipital region. Apparently this did not require surgical intervention and according to the patient he has not had any repeat scans. The patient does snore but denies any apnea events. Wife confirms this. Denies excessive daytime sleepiness. He returns today for an evaluation.   HISTORY 05/13/15 Krista Blue): Russell Padilla is a 59 years old right-handed male, accompanied by his wife evaluation for severe headaches, his primary care physician is Dr. Sharilyn Sites.  He has past medical history of hypertension, hyperlipidemia, coronary artery disease, status post stent, kidney stone, he also had a history of cervical, lumbar fusion surgery in the past, most recent was 2005, for neck pain, radiating pain to bilateral shoulder  His left facial pain/ headache started since 2001, after a dental procedure, he had a constant left facial pain, swollen, due to side effect of local anesthesia, he had difficulty open up his mouth, has to take gabapentin for one year, his left facial pain gradually improved  But over the past few years, he continues to have occasionally flareup of his left facial deep achy pain, 3-4 times each month, very similar pain following his dental procedure, mild to moderate, lasting few hours to 1 day, Excedrin Migraine as needed has been helpful  He also has different kind of headaches since 2015, starting from his upper cervical, bilateral occipital region, spreading forward, become holocranial moderate severe pressure pounding headaches with associated light noise sensitivity, very painful, difficult to touch his pillow because the occipital area pain, hypersensitivity, not responsive to Excedrin Migraine, sometimes he has to take take Percocet, muscle relaxant Flexeril, Robaxin with limited help, lasting half to 1 day   REVIEW OF SYSTEMS: Out of a complete 14 system review of symptoms, the patient complains only of the following symptoms, and all other reviewed systems are negative.  Ringing in ears, runny nose, blurred vision, constipation, joint pain, joint swelling, muscle cramps, neck pain,  headache  ALLERGIES: Allergies  Allergen Reactions  . Latex Hives and Rash    When at dentist    HOME MEDICATIONS: Outpatient Prescriptions Prior to Visit  Medication Sig Dispense Refill  . aspirin 81 MG EC tablet Take 81 mg by mouth daily. Swallow whole.    . cyclobenzaprine (FLEXERIL) 10 MG tablet Take 10 mg by mouth 3 (three) times  daily as needed for muscle spasms.    . diclofenac (VOLTAREN) 50 MG EC tablet Take 1 tablet (50 mg total) by mouth as needed (migraine headache.  Do not exceed 1 tablet daily). 15 tablet 6  . dicyclomine (BENTYL) 20 MG tablet Take 20 mg by mouth 4 (four) times daily as needed (IBS).     Marland Kitchen docusate sodium (COLACE) 100 MG capsule Take 100 mg by mouth daily.     . fluticasone (FLONASE) 50 MCG/ACT nasal spray Place 2 sprays into both nostrils daily.    Marland Kitchen gabapentin (NEURONTIN) 300 MG capsule Take 1 capsule (300 mg total) by mouth 3 (three) times daily. 90 capsule 11  . methocarbamol (ROBAXIN) 500 MG tablet Take 500 mg by mouth as needed for muscle spasms.    . Multiple Vitamins-Minerals (MULTIVITAMIN WITH MINERALS) tablet Take 1 tablet by mouth daily.    . nitroGLYCERIN (NITROSTAT) 0.4 MG SL tablet Place 1 tablet (0.4 mg total) under the tongue every 5 (five) minutes as needed for chest pain. 25 tablet 5  . olmesartan (BENICAR) 40 MG tablet Take 40 mg by mouth daily.    Marland Kitchen oxyCODONE-acetaminophen (PERCOCET/ROXICET) 5-325 MG per tablet Take 1 tablet by mouth every 4 (four) hours as needed for pain.    . pantoprazole (PROTONIX) 40 MG tablet Take 40 mg by mouth 2 (two) times daily.     . Probiotic Product (PROBIOTIC DAILY PO) Take 1 tablet by mouth daily.    Marland Kitchen senna (SENOKOT) 8.6 MG TABS Take 2 tablets by mouth at bedtime.     . sildenafil (VIAGRA) 100 MG tablet Take 100 mg by mouth daily as needed for erectile dysfunction.    . simvastatin (ZOCOR) 40 MG tablet Take 20 mg by mouth at bedtime. Patient taking one half tablet (20 mg) by mouth every evening    . sucralfate (CARAFATE) 1 GM/10ML suspension Take 1 g by mouth 4 (four) times daily -  with meals and at bedtime.     No facility-administered medications prior to visit.    PAST MEDICAL HISTORY: Past Medical History  Diagnosis Date  . Renal disorder     renal calculi  . Stomach pain     chronic  . CAD (coronary artery disease)     a. 12/2012  Cath/PCI: DES to superior branch of OM2;  b. Repeat cath 4/14 with 20% LAD, patent stent OM3, 70% diffuse stenosis small, non-dominant RCA.  Marland Kitchen HLD (hyperlipidemia)   . Essential hypertension   . Abdominal pain 07-11-13    ongoing and unspecified  . Arthritis   . Bladder stone 07-11-13    surgery planned  . Headache     PAST SURGICAL HISTORY: Past Surgical History  Procedure Laterality Date  . Back surgery      2 cervical/ 2 lumbar fusions  . Coronary stent placement      3'14  . Knee arthroscopy Left     '80's  . Cholecystectomy      laparoscopic.  Marland Kitchen Esophagogastroduodenoscopy endoscopy      multiple times-[facility]/ and now being followed by Endo- [facility]  .  Left heart catheterization with coronary angiogram N/A 12/05/2012    Procedure: LEFT HEART CATHETERIZATION WITH CORONARY ANGIOGRAM;  Surgeon: Birdie Riddle, MD;  Location: [place] CATH LAB;  Service: Cardiovascular;  Laterality: N/A;  . Percutaneous coronary stent intervention (pci-s) N/A 12/06/2012    Procedure: PERCUTANEOUS CORONARY STENT INTERVENTION (PCI-S);  Surgeon: Burnell Blanks, MD;  Location: [facility] CATH LAB;  Service: Cardiovascular;  Laterality: N/A;  . Left heart catheterization with coronary angiogram N/A 01/17/2013    Procedure: LEFT HEART CATHETERIZATION WITH CORONARY ANGIOGRAM;  Surgeon: Wellington Hampshire, MD;  Location: [place] CATH LAB;  Service: Cardiovascular;  Laterality: N/A;  . Cardiac catheterization N/A 03/04/2015    Procedure: Left Heart Cath and Coronary Angiography;  Surgeon: Burnell Blanks, MD;  Location: [place] CV LAB;  Service: Cardiovascular;  Laterality: N/A;    FAMILY HISTORY: Family History  Problem Relation Age of Onset  . Heart disease Brother   . Stomach cancer Mother   . Congestive Heart Failure Father   . Dementia Father     SOCIAL HISTORY: Social History   Social History  . Marital Status: Married    Spouse Name: N/A  . Number of Children: 0  . Years of Education: 14    Occupational History  . Retired    Social History Main Topics  . Smoking status: Former Smoker -- 0.50 packs/day for 30 years    Types: Cigarettes    Start date: 12/24/1976    Quit date: 12/04/2012  . Smokeless tobacco: Never Used  . Alcohol Use: No  . Drug Use: No  . Sexual Activity: Not on file   Other Topics Concern  . Not on file   Social History Narrative   Lives at home with his wife, Regino Schultze.   Right-handed.   4-5 cans diet colas and occasional glasses of tea per day.      PHYSICAL EXAM  Filed Vitals:   06/02/15 1524  Height: 5\' 11"  (1.803 m)  Weight: 230 lb (104.327 kg)   Body mass index is 32.09 kg/(m^2).  Generalized: Well developed, in no acute distress   Neurological examination  Mentation: Alert oriented to time, place, history taking. Follows all commands speech and language fluent Cranial nerve II-XII: Pupils were equal round reactive to light. Extraocular movements were full, visual field were full on confrontational test. Facial sensation and strength were normal. Uvula tongue midline. Head turning and shoulder shrug  were normal and symmetric. Motor: The motor testing reveals 5 over 5 strength of all 4 extremities. Good symmetric motor tone is noted throughout.  Sensory: Sensory testing is intact to soft touch on all 4 extremities. No evidence of extinction is noted.  Coordination: Cerebellar testing reveals good finger-nose-finger and heel-to-shin bilaterally.  Gait and station: Gait is normal. Tandem gait is normal. Romberg is negative. No drift is seen.  Reflexes: Deep tendon reflexes are symmetric and normal bilaterally.   DIAGNOSTIC DATA (LABS, IMAGING, TESTING) - I reviewed patient records, labs, notes, testing and imaging myself where available.  Lab Results  Component Value Date   WBC 5.0 02/20/2015   HGB 15.3 02/20/2015   HCT 45.1 02/20/2015   MCV 87.7 02/20/2015   PLT 159.0 02/20/2015      Component Value Date/Time   NA 140  02/20/2015 1305   K 4.2 02/20/2015 1305   CL 103 02/20/2015 1305   CO2 32 02/20/2015 1305   GLUCOSE 86 02/20/2015 1305   BUN 13 02/20/2015 1305   CREATININE 1.17 02/20/2015  1305   CALCIUM 9.8 02/20/2015 1305   PROT 6.9 12/04/2012 1917   ALBUMIN 3.8 12/04/2012 1917   AST 18 12/04/2012 1917   ALT 18 12/04/2012 1917   ALKPHOS 79 12/04/2012 1917   BILITOT 0.3 12/04/2012 1917   GFRNONAA 68* 07/11/2013 1120   GFRAA 78* 07/11/2013 1120     ( ASSESSMENT AND PLAN 59 y.o. year old male  has a past medical history of Renal disorder; Stomach pain; CAD (coronary artery disease); HLD (hyperlipidemia); Essential hypertension; Abdominal pain (07-11-13); Arthritis; Bladder stone (07-11-13); and Headache. here with:  1. Headache  The patient's headache frequency has not changed. He did have a severe headache on Sunday and that is the reason he made an appointment today. His headaches normally occur first thing in the morning with hypersensitivity in the occipital region. The patient is currently taking gabapentin 300 mg 3 times a day. I will change his gabapentin taking one tablet in the morning and 2 tablets at bedtime. He will let me know if this is beneficial. I explained that he can take diclofenac 1 tablet daily as needed for headache. If his headaches do not improve in the future he may require an MRI of the brain. The patient will let me know if his headache frequency or severity does not improve. He will keep his appointment in October with Dr. Krista Blue.     Ward Givens, MSN, NP-C 06/02/2015, 3:25 PM [facility] [address], [place], [place] [address] [phone]

## 2015-06-02 NOTE — Progress Notes (Signed)
I have read the note, and I agree with the clinical assessment and plan.  WILLIS,CHARLES KEITH   

## 2015-06-02 NOTE — Telephone Encounter (Signed)
Patient's wife called stating the headaches have been daily for a couple of weeks. She is requesting he be seen. I explained Dr Krista Blue was not in the office but he could possibly see NP Megan. Please call and advise. Patient can be reached at [phone].

## 2015-06-26 ENCOUNTER — Encounter: Payer: Self-pay | Admitting: Gastroenterology

## 2015-07-14 ENCOUNTER — Ambulatory Visit: Payer: 59 | Admitting: Neurology

## 2015-08-03 ENCOUNTER — Other Ambulatory Visit: Payer: Self-pay

## 2015-08-03 MED ORDER — GABAPENTIN 300 MG PO CAPS: ORAL_CAPSULE | ORAL | Status: AC

## 2015-08-11 ENCOUNTER — Ambulatory Visit (INDEPENDENT_AMBULATORY_CARE_PROVIDER_SITE_OTHER): Payer: 59 | Admitting: Neurology

## 2015-08-11 ENCOUNTER — Encounter: Payer: Self-pay | Admitting: Neurology

## 2015-08-11 VITALS — BP 138/82 | HR 72 | Ht 71.0 in | Wt 230.0 lb

## 2015-08-11 DIAGNOSIS — M542 Cervicalgia: Secondary | ICD-10-CM | POA: Diagnosis not present

## 2015-08-11 DIAGNOSIS — M545 Low back pain, unspecified: Secondary | ICD-10-CM

## 2015-08-11 DIAGNOSIS — G8929 Other chronic pain: Secondary | ICD-10-CM | POA: Diagnosis not present

## 2015-08-11 DIAGNOSIS — R51 Headache: Secondary | ICD-10-CM

## 2015-08-11 DIAGNOSIS — R519 Headache, unspecified: Secondary | ICD-10-CM

## 2015-08-11 NOTE — Progress Notes (Signed)
Chief Complaint  Patient presents with  . Headache    He is here with his wife, Regino Schultze.  He is still having frequent headaches.  He is still taking gabapentin 300mg  but only one capsule twice daily.  Feels the increased dose makes him too sedative.        PATIENT: Russell Padilla DOB: July 10, 1956  REASON FOR VISIT: follow up- headache HISTORY FROM: patient  HISTORY OF PRESENT ILLNESS: Russell Padilla is a 59 year old male with a history of left facial pain and headaches. He returns today complaining of an ongoing headache. At the last office visit Dr. Krista Blue started him on gabapentin 300 mg 3 times a day. Patient does not feel that his headache frequency has changed since the last visit however he did have a severe headache on Sunday that lasted pretty much all day into Monday. He states that he was given diclofenac to take for acute headache. He states that on the bottle it states to take only at bedtime PRN. This was not how the medication was prescribed. He states that when he does take the diclofenac his headache does resolve. When he does get a headache it normally occurs first thing in the morning. His headaches were initially starting in the neck and radiating to the back of the head. However more recently the headaches have started in the back of the head and radiated down the neck. In the mornings his headaches can be severe enough that it causes hypersensitivity in the back of the head in the occipital region. He states that occasionally he will have the sensation that the back of his head is swollen. He describes his headaches as a pressure sensation. According to the patient many years ago he saw Dr. Rolin Barry and he did an MRI of the brain. He was told that he had a "growth" on the right side of the head in the occipital region. Apparently this did not require surgical intervention and according to the patient he has not had any repeat scans. The patient does snore but denies any apnea events. Wife  confirms this. Denies excessive daytime sleepiness. He returns today for an evaluation.   HISTORY 05/13/15 Krista Blue): Russell Padilla is a 59 years old right-handed male, accompanied by his wife evaluation for severe headaches, his primary care physician is Dr. Sharilyn Sites.  He has past medical history of hypertension, hyperlipidemia, coronary artery disease, status post stent, kidney stone, he also had a history of cervical, lumbar fusion surgery in the past, most recent was 2005, for neck pain, radiating pain to bilateral shoulder  His left facial pain/ headache started since 2001, after a dental procedure, he had a constant left facial pain, swollen, due to side effect of local anesthesia, he had difficulty open up his mouth, has to take gabapentin for one year, his left facial pain gradually improved  But over the past few years, he continues to have occasionally flareup of his left facial deep achy pain, 3-4 times each month, very similar pain following his dental procedure, mild to moderate, lasting few hours to 1 day, Excedrin Migraine as needed has been helpful  He also has different kind of headaches since 2015, starting from his upper cervical, bilateral occipital region, spreading forward, become holocranial moderate severe pressure pounding headaches with associated light noise sensitivity, very painful, difficult to touch his pillow because the occipital area pain, hypersensitivity, not responsive to Excedrin Migraine, sometimes he has to take take Percocet, muscle relaxant Flexeril, Robaxin with  limited help, lasting half to 1 day  UPDATE Aug 11 2015: He continue have frequent headaches, usually happened early-morning, starting from his neck, spreading forward, pressure headaches, hypersensitivity of occipital region when he gets headaches, he has tried diclofenac that was helpful, he is taking gabapentin 300/600 mg every night, which seems to be helpful  In addition, he complains of  excessive daytime sleepiness, snoring at nighttime, frequent awakening, irregular sleeping time  We have reviewed MRI of lumbar spine in January 2016: 1. Lumbar fusion at L3-4 and L4-5 without residual recurrent stenosis at either level. Progressive endplate marrow changes and moderate to severe central canal stenosis at L2-3 with right greater than left subarticular narrowing. Mild right foraminal narrowing at L2-3.  Progressive central canal stenosis and left greater than right subarticular stenosis at L1-2.  MRI of cervical spine: Progression of moderate foraminal narrowing bilaterally at C3-4, left greater than right. Mild central canal narrowing at C3-4. Moderate right and mild left foraminal stenosis at C4-5 without significant interval change.  Anterior fusion at C5-6 and C6-7 without residual recurrent stenosis at either level.  REVIEW OF SYSTEMS: Out of a complete 14 system review of symptoms, the patient complains only of the following symptoms, and all other reviewed systems are negative.  Snoring, excessive daytime sleepiness, chronic neck pain, chronic low back pain  ALLERGIES: Allergies  Allergen Reactions  . Latex Hives and Rash    When at dentist    HOME MEDICATIONS: Outpatient Prescriptions Prior to Visit  Medication Sig Dispense Refill  . aspirin 81 MG EC tablet Take 81 mg by mouth daily. Swallow whole.    . cyclobenzaprine (FLEXERIL) 10 MG tablet Take 10 mg by mouth 3 (three) times daily as needed for muscle spasms.    . diclofenac (VOLTAREN) 50 MG EC tablet Take 1 tablet (50 mg total) by mouth as needed (migraine headache.  Do not exceed 1 tablet daily). 15 tablet 6  . dicyclomine (BENTYL) 20 MG tablet Take 20 mg by mouth 4 (four) times daily as needed (IBS).     Marland Kitchen docusate sodium (COLACE) 100 MG capsule Take 100 mg by mouth daily.     . fluticasone (FLONASE) 50 MCG/ACT nasal spray Place 2 sprays into both nostrils daily.    Marland Kitchen gabapentin (NEURONTIN) 300 MG capsule Take 1  tablet PO in the morning and take 2 tablets PO at bedtime. 270 capsule 3  . methocarbamol (ROBAXIN) 500 MG tablet Take 500 mg by mouth as needed for muscle spasms.    . Multiple Vitamins-Minerals (MULTIVITAMIN WITH MINERALS) tablet Take 1 tablet by mouth daily.    . nitroGLYCERIN (NITROSTAT) 0.4 MG SL tablet Place 1 tablet (0.4 mg total) under the tongue every 5 (five) minutes as needed for chest pain. 25 tablet 5  . olmesartan (BENICAR) 40 MG tablet Take 40 mg by mouth daily.    Marland Kitchen oxyCODONE-acetaminophen (PERCOCET/ROXICET) 5-325 MG per tablet Take 1 tablet by mouth every 4 (four) hours as needed for pain.    . pantoprazole (PROTONIX) 40 MG tablet Take 40 mg by mouth 2 (two) times daily.     . Probiotic Product (PROBIOTIC DAILY PO) Take 1 tablet by mouth daily.    Marland Kitchen senna (SENOKOT) 8.6 MG TABS Take 2 tablets by mouth at bedtime.     . sildenafil (VIAGRA) 100 MG tablet Take 100 mg by mouth daily as needed for erectile dysfunction.    . simvastatin (ZOCOR) 40 MG tablet Take 20 mg by mouth at bedtime.  Patient taking one half tablet (20 mg) by mouth every evening    . sucralfate (CARAFATE) 1 GM/10ML suspension Take 1 g by mouth 4 (four) times daily -  with meals and at bedtime.     No facility-administered medications prior to visit.    PAST MEDICAL HISTORY: Past Medical History  Diagnosis Date  . Renal disorder     renal calculi  . Stomach pain     chronic  . CAD (coronary artery disease)     a. 12/2012 Cath/PCI: DES to superior branch of OM2;  b. Repeat cath 4/14 with 20% LAD, patent stent OM3, 70% diffuse stenosis small, non-dominant RCA.  Marland Kitchen HLD (hyperlipidemia)   . Essential hypertension   . Abdominal pain 07-11-13    ongoing and unspecified  . Arthritis   . Bladder stone 07-11-13    surgery planned  . Headache     PAST SURGICAL HISTORY: Past Surgical History  Procedure Laterality Date  . Back surgery      2 cervical/ 2 lumbar fusions  . Coronary stent placement      3'14  .  Knee arthroscopy Left     '80's  . Cholecystectomy      laparoscopic.  Marland Kitchen Esophagogastroduodenoscopy endoscopy      multiple times-[facility]/ and now being followed by Endo- [facility]  . Left heart catheterization with coronary angiogram N/A 12/05/2012    Procedure: LEFT HEART CATHETERIZATION WITH CORONARY ANGIOGRAM;  Surgeon: Birdie Riddle, MD;  Location: [facility] CATH LAB;  Service: Cardiovascular;  Laterality: N/A;  . Percutaneous coronary stent intervention (pci-s) N/A 12/06/2012    Procedure: PERCUTANEOUS CORONARY STENT INTERVENTION (PCI-S);  Surgeon: Burnell Blanks, MD;  Location: [facility] CATH LAB;  Service: Cardiovascular;  Laterality: N/A;  . Left heart catheterization with coronary angiogram N/A 01/17/2013    Procedure: LEFT HEART CATHETERIZATION WITH CORONARY ANGIOGRAM;  Surgeon: Wellington Hampshire, MD;  Location: [facility] CATH LAB;  Service: Cardiovascular;  Laterality: N/A;  . Cardiac catheterization N/A 03/04/2015    Procedure: Left Heart Cath and Coronary Angiography;  Surgeon: Burnell Blanks, MD;  Location: [place] CV LAB;  Service: Cardiovascular;  Laterality: N/A;    FAMILY HISTORY: Family History  Problem Relation Age of Onset  . Heart disease Brother   . Stomach cancer Mother   . Congestive Heart Failure Father   . Dementia Father     SOCIAL HISTORY: Social History   Social History  . Marital Status: Married    Spouse Name: N/A  . Number of Children: 0  . Years of Education: 14   Occupational History  . Retired    Social History Main Topics  . Smoking status: Former Smoker -- 0.50 packs/day for 30 years    Types: Cigarettes    Quit date: 12/04/2012  . Smokeless tobacco: Never Used  . Alcohol Use: No  . Drug Use: No  . Sexual Activity: Not on file   Other Topics Concern  . Not on file   Social History Narrative   Lives at home with his wife, Regino Schultze.   Right-handed.   4-5 cans diet colas and occasional glasses of tea per day.   Retired 2007.       PHYSICAL EXAM  Filed Vitals:   08/11/15 1421  BP: 138/82  Pulse: 72  Height: 5\' 11"  (1.803 m)  Weight: 230 lb (104.327 kg)   Body mass index is 32.09 kg/(m^2).   PHYSICAL EXAMNIATION:  Gen: NAD, conversant, well nourised, obese, well  groomed                     Cardiovascular: Regular rate rhythm, no peripheral edema, warm, nontender. Eyes: Conjunctivae clear without exudates or hemorrhage Neck: Supple, no carotid bruise. Pulmonary: Clear to auscultation bilaterally   NEUROLOGICAL EXAM:  MENTAL STATUS: Speech:    Speech is normal; fluent and spontaneous with normal comprehension.  Cognition:     Orientation to time, place and person     Normal recent and remote memory     Normal Attention span and concentration     Normal Language, naming, repeating,spontaneous speech     Fund of knowledge   CRANIAL NERVES: CN II: Visual fields are full to confrontation. Fundoscopic exam is normal with sharp discs and no vascular changes. Pupils are round equal and briskly reactive to light. CN III, IV, VI: extraocular movement are normal. No ptosis. CN V: Facial sensation is intact to pinprick in all 3 divisions bilaterally. Corneal responses are intact.  CN VII: Face is symmetric with normal eye closure and smile. CN VIII: Hearing is normal to rubbing fingers CN IX, X: Palate elevates symmetrically. Phonation is normal. CN XI: Head turning and shoulder shrug are intact CN XII: Tongue is midline with normal movements and no atrophy.  MOTOR: There is no pronator drift of out-stretched arms. Muscle bulk and tone are normal. Muscle strength is normal.  REFLEXES: Reflexes are 2+ and symmetric at the biceps, triceps, knees, and ankles. Plantar responses are flexor.  SENSORY: Intact to light touch, pinprick, position sense, and vibration sense are intact in fingers and toes.  COORDINATION: Rapid alternating movements and fine finger movements are intact. There is no dysmetria  on finger-to-nose and heel-knee-shin.    GAIT/STANCE: Posture is normal. Gait is steady with normal steps, base, arm swing, and turning. Heel and toe walking are normal. Tandem gait is normal.  Romberg is absent.    DIAGNOSTIC DATA (LABS, IMAGING, TESTING)  ASSESSMENT AND PLAN  Headaches  Has a cervicogenic component  Continue gabapentin 300/600 mg, diclofenac as needed  Excessive daytime sleepiness   He does snore, ESS score is 11  Will consider sleep study if he continue has early morning headaches, excessive daytime sleepiness   Marcial Pacas, M.D. Ph.D.  [facility] [place], [place] [address] Phone: [phone] Fax:      [phone]

## 2015-08-24 ENCOUNTER — Ambulatory Visit (HOSPITAL_COMMUNITY)
Admission: RE | Admit: 2015-08-24 | Discharge: 2015-08-24 | Disposition: A | Discharge status: Home / Self Care | Payer: 59 | Source: Ambulatory Visit | Attending: Family Medicine | Admitting: Family Medicine

## 2015-08-24 ENCOUNTER — Other Ambulatory Visit (HOSPITAL_COMMUNITY): Payer: Self-pay | Admitting: Family Medicine

## 2015-08-24 DIAGNOSIS — N200 Calculus of kidney: Secondary | ICD-10-CM | POA: Diagnosis not present

## 2015-08-24 DIAGNOSIS — R109 Unspecified abdominal pain: Secondary | ICD-10-CM

## 2015-08-24 DIAGNOSIS — K5792 Diverticulitis of intestine, part unspecified, without perforation or abscess without bleeding: Secondary | ICD-10-CM | POA: Diagnosis not present

## 2015-08-24 DIAGNOSIS — R1032 Left lower quadrant pain: Secondary | ICD-10-CM | POA: Insufficient documentation

## 2015-08-24 MED ORDER — IOHEXOL 300 MG/ML  SOLN
100.0000 mL | Freq: Once | INTRAMUSCULAR | Status: AC | PRN
  Administered 2015-08-24: 100 mL via INTRAVENOUS

## 2015-11-04 ENCOUNTER — Encounter: Payer: Self-pay | Admitting: Gastroenterology

## 2015-12-24 ENCOUNTER — Telehealth: Payer: Self-pay | Admitting: Adult Health

## 2015-12-24 NOTE — Telephone Encounter (Signed)
Error

## 2015-12-30 NOTE — Telephone Encounter (Signed)
Error

## 2016-02-09 ENCOUNTER — Ambulatory Visit: Payer: 59 | Admitting: Neurology

## 2016-02-18 ENCOUNTER — Ambulatory Visit: Payer: Self-pay | Admitting: Neurology

## 2016-03-01 ENCOUNTER — Encounter: Payer: Self-pay | Admitting: Neurology

## 2016-03-01 ENCOUNTER — Ambulatory Visit (INDEPENDENT_AMBULATORY_CARE_PROVIDER_SITE_OTHER): Payer: 59 | Admitting: Neurology

## 2016-03-01 VITALS — BP 118/75 | HR 65 | Ht 71.0 in | Wt 236.5 lb

## 2016-03-01 DIAGNOSIS — M5416 Radiculopathy, lumbar region: Secondary | ICD-10-CM | POA: Diagnosis not present

## 2016-03-01 DIAGNOSIS — G8929 Other chronic pain: Secondary | ICD-10-CM | POA: Insufficient documentation

## 2016-03-01 DIAGNOSIS — R51 Headache: Secondary | ICD-10-CM

## 2016-03-01 DIAGNOSIS — G44229 Chronic tension-type headache, not intractable: Secondary | ICD-10-CM | POA: Diagnosis not present

## 2016-03-01 DIAGNOSIS — R519 Headache, unspecified: Secondary | ICD-10-CM | POA: Insufficient documentation

## 2016-03-01 MED ORDER — DULOXETINE HCL 30 MG PO CPEP: 60.0000 mg | ORAL_CAPSULE | Freq: Every day | ORAL | Status: DC

## 2016-03-01 NOTE — Progress Notes (Signed)
Chief Complaint  Patient presents with  . Headache    He is here with his wife, Russell Padilla. He is still having frequent headaches.  He is currently taking gabapentin 600mg  at bedtime only.  The morning dose was causing him intolerable drowsiness during the day.  . Back Pain    He woke up with increased low back pain that is radiating down his right leg.      PATIENT: Russell Padilla DOB: 29-Jun-1956  REASON FOR VISIT: follow up- headache HISTORY FROM: patient  HISTORY OF PRESENT ILLNESS: HISTORY 05/13/15: Russell Padilla is a 60 years old right-handed male, accompanied by his wife evaluation for severe headaches, his primary care physician is Dr. Sharilyn Sites.  He has past medical history of hypertension, hyperlipidemia, coronary artery disease, status post stent, kidney stone, he also had a history of cervical, lumbar fusion surgery in the past, most recent was 2005, for neck pain, radiating pain to bilateral shoulder  His left facial pain/ headache started since 2001, after a dental procedure, he had a constant left facial pain, swollen, due to side effect of local anesthesia, he had difficulty open up his mouth, has to take gabapentin for one year, his left facial pain gradually improved  But over the past few years, he continues to have occasionally flareup of his left facial deep achy pain, 3-4 times each month, very similar pain following his dental procedure, mild to moderate, lasting few hours to 1 day, Excedrin Migraine as needed has been helpful  He also has different kind of headaches since 2015, starting from his upper cervical, bilateral occipital region, spreading forward, become holocranial moderate severe pressure pounding headaches with associated light noise sensitivity, very painful, difficult to touch his pillow because the occipital area pain, hypersensitivity, not responsive to Excedrin Migraine, sometimes he has to take take Percocet, muscle relaxant Flexeril, Robaxin with  limited help, lasting half to 1 day  UPDATE Aug 11 2015: He continue have frequent headaches, usually happened early-morning, starting from his neck, spreading forward, pressure headaches, hypersensitivity of occipital region when he gets headaches, he has tried diclofenac that was helpful, he is taking gabapentin 300/600 mg every night, which seems to be helpful  In addition, he complains of excessive daytime sleepiness, snoring at nighttime, frequent awakening, irregular sleeping time  We have reviewed MRI of lumbar spine in January 2016: 1. Lumbar fusion at L3-4 and L4-5 without residual recurrent stenosis at either level. Progressive endplate marrow changes and moderate to severe central canal stenosis at L2-3 with right greater than left subarticular narrowing. Mild right foraminal narrowing at L2-3.  Progressive central canal stenosis and left greater than right subarticular stenosis at L1-2.  MRI of cervical spine: Progression of moderate foraminal narrowing bilaterally at C3-4, left greater than right. Mild central canal narrowing at C3-4. Moderate right and mild left foraminal stenosis at C4-5 without significant interval change.  Anterior fusion at C5-6 and C6-7 without residual recurrent stenosis at either level.  UPDATE Mar 01 2016: He had CT abdomen showed evidence of diverticulitis at the junction of the descending and sigmoid colons in Nov 2016.  He has recurrent right side low back pain since May 29th 2017, he has multiple lumbar fusion surgery in the past by Dr. Ellene Route, on polypharmacy, including Robaxin, Flexeril, NSAIDs, even Percocet as needed for his recurrent severe low back pain, he also has chronic neck pain, still has frequent headaches 3-4 times each week, spreading from bilateral occipital region to bilateral frontal  vortex region, he is taking baclofen neck as needed, which has helped his headache,  REVIEW OF SYSTEMS: Out of a complete 14 system review of symptoms, the  patient complains only of the following symptoms, and all other reviewed systems are negative.  Snoring, excessive daytime sleepiness, chronic neck pain, chronic low back pain    ALLERGIES: Allergies  Allergen Reactions  . Latex Hives and Rash    When at dentist    HOME MEDICATIONS: Outpatient Prescriptions Prior to Visit  Medication Sig Dispense Refill  . aspirin 81 MG EC tablet Take 81 mg by mouth daily. Swallow whole.    . cyclobenzaprine (FLEXERIL) 10 MG tablet Take 10 mg by mouth 3 (three) times daily as needed for muscle spasms.    . diclofenac (VOLTAREN) 50 MG EC tablet Take 1 tablet (50 mg total) by mouth as needed (migraine headache.  Do not exceed 1 tablet daily). 15 tablet 6  . dicyclomine (BENTYL) 20 MG tablet Take 20 mg by mouth 4 (four) times daily as needed (IBS).     Marland Kitchen docusate sodium (COLACE) 100 MG capsule Take 100 mg by mouth daily.     . fluticasone (FLONASE) 50 MCG/ACT nasal spray Place 2 sprays into both nostrils daily.    Marland Kitchen gabapentin (NEURONTIN) 300 MG capsule Take 1 tablet PO in the morning and take 2 tablets PO at bedtime. 270 capsule 3  . methocarbamol (ROBAXIN) 500 MG tablet Take 500 mg by mouth as needed for muscle spasms.    . Multiple Vitamins-Minerals (MULTIVITAMIN WITH MINERALS) tablet Take 1 tablet by mouth daily.    . nitroGLYCERIN (NITROSTAT) 0.4 MG SL tablet Place 1 tablet (0.4 mg total) under the tongue every 5 (five) minutes as needed for chest pain. 25 tablet 5  . olmesartan (BENICAR) 40 MG tablet Take 40 mg by mouth daily.    Marland Kitchen oxyCODONE-acetaminophen (PERCOCET/ROXICET) 5-325 MG per tablet Take 1 tablet by mouth every 4 (four) hours as needed for pain.    . pantoprazole (PROTONIX) 40 MG tablet Take 40 mg by mouth 2 (two) times daily.     . Probiotic Product (PROBIOTIC DAILY PO) Take 1 tablet by mouth daily.    Marland Kitchen senna (SENOKOT) 8.6 MG TABS Take 2 tablets by mouth at bedtime.     . sildenafil (VIAGRA) 100 MG tablet Take 100 mg by mouth daily as  needed for erectile dysfunction.    . simvastatin (ZOCOR) 40 MG tablet Take 20 mg by mouth at bedtime. Patient taking one half tablet (20 mg) by mouth every evening    . sucralfate (CARAFATE) 1 GM/10ML suspension Take 1 g by mouth 4 (four) times daily -  with meals and at bedtime.     No facility-administered medications prior to visit.    PAST MEDICAL HISTORY: Past Medical History  Diagnosis Date  . Renal disorder     renal calculi  . Stomach pain     chronic  . CAD (coronary artery disease)     a. 12/2012 Cath/PCI: DES to superior branch of OM2;  b. Repeat cath 4/14 with 20% LAD, patent stent OM3, 70% diffuse stenosis small, non-dominant RCA.  Marland Kitchen HLD (hyperlipidemia)   . Essential hypertension   . Abdominal pain 07-11-13    ongoing and unspecified  . Arthritis   . Bladder stone 07-11-13    surgery planned  . Headache     PAST SURGICAL HISTORY: Past Surgical History  Procedure Laterality Date  . Back surgery  2 cervical/ 2 lumbar fusions  . Coronary stent placement      3'14  . Knee arthroscopy Left     '80's  . Cholecystectomy      laparoscopic.  Marland Kitchen Esophagogastroduodenoscopy endoscopy      multiple times-[facility]/ and now being followed by Endo- [facility]  . Left heart catheterization with coronary angiogram N/A 12/05/2012    Procedure: LEFT HEART CATHETERIZATION WITH CORONARY ANGIOGRAM;  Surgeon: Birdie Riddle, MD;  Location: [place] CATH LAB;  Service: Cardiovascular;  Laterality: N/A;  . Percutaneous coronary stent intervention (pci-s) N/A 12/06/2012    Procedure: PERCUTANEOUS CORONARY STENT INTERVENTION (PCI-S);  Surgeon: Burnell Blanks, MD;  Location: [facility] CATH LAB;  Service: Cardiovascular;  Laterality: N/A;  . Left heart catheterization with coronary angiogram N/A 01/17/2013    Procedure: LEFT HEART CATHETERIZATION WITH CORONARY ANGIOGRAM;  Surgeon: Wellington Hampshire, MD;  Location: [place] CATH LAB;  Service: Cardiovascular;  Laterality: N/A;  . Cardiac catheterization  N/A 03/04/2015    Procedure: Left Heart Cath and Coronary Angiography;  Surgeon: Burnell Blanks, MD;  Location: [place] CV LAB;  Service: Cardiovascular;  Laterality: N/A;    FAMILY HISTORY: Family History  Problem Relation Age of Onset  . Heart disease Brother   . Stomach cancer Mother   . Congestive Heart Failure Father   . Dementia Father     SOCIAL HISTORY: Social History   Social History  . Marital Status: Married    Spouse Name: N/A  . Number of Children: 0  . Years of Education: 14   Occupational History  . Retired    Social History Main Topics  . Smoking status: Former Smoker -- 0.50 packs/day for 30 years    Types: Cigarettes    Quit date: 12/04/2012  . Smokeless tobacco: Never Used  . Alcohol Use: No  . Drug Use: No  . Sexual Activity: Not on file   Other Topics Concern  . Not on file   Social History Narrative   Lives at home with his wife, Russell Padilla.   Right-handed.   4-5 cans diet colas and occasional glasses of tea per day.   Retired 2007.      PHYSICAL EXAM  Filed Vitals:   03/01/16 1636  BP: 118/75  Pulse: 65  Height: 5\' 11"  (1.803 m)  Weight: 236 lb 8 oz (107.276 kg)   Body mass index is 33 kg/(m^2).   PHYSICAL EXAMNIATION:  Gen: NAD, conversant, well nourised, obese, well groomed                     Cardiovascular: Regular rate rhythm, no peripheral edema, warm, nontender. Eyes: Conjunctivae clear without exudates or hemorrhage Neck: Supple, no carotid bruise. Pulmonary: Clear to auscultation bilaterally   NEUROLOGICAL EXAM:  MENTAL STATUS: Speech:    Speech is normal; fluent and spontaneous with normal comprehension.  Cognition:     Orientation to time, place and person     Normal recent and remote memory     Normal Attention span and concentration     Normal Language, naming, repeating,spontaneous speech     Fund of knowledge   CRANIAL NERVES: CN II: Visual fields are full to confrontation. Fundoscopic exam is  normal with sharp discs and no vascular changes. Pupils are round equal and briskly reactive to light. CN III, IV, VI: extraocular movement are normal. No ptosis. CN V: Facial sensation is intact to pinprick in all 3 divisions bilaterally. Corneal responses are intact.  CN VII: Face is symmetric with normal eye closure and smile. CN VIII: Hearing is normal to rubbing fingers CN IX, X: Palate elevates symmetrically. Phonation is normal. CN XI: Head turning and shoulder shrug are intact CN XII: Tongue is midline with normal movements and no atrophy.  MOTOR: There is no pronator drift of out-stretched arms. Muscle bulk and tone are normal. Muscle strength is normal.  REFLEXES: Reflexes are 2+ and symmetric at the biceps, triceps, knees, and ankles. Plantar responses are flexor.  SENSORY: Intact to light touch, pinprick, position sense, and vibration sense are intact in fingers and toes.  COORDINATION: Rapid alternating movements and fine finger movements are intact. There is no dysmetria on finger-to-nose and heel-knee-shin.    GAIT/STANCE: Posture is normal. Gait is steady with normal steps, base, arm swing, and turning. Heel and toe walking are normal. Tandem gait is normal.  Romberg is absent.    DIAGNOSTIC DATA (LABS, IMAGING, TESTING)  ASSESSMENT AND PLAN  Headaches  Has a cervicogenic component  Continue gabapentin 600 mg every night, daytime dose cause excessive drowsiness, diclofenac as needed  Chronic low back pain,  Add on Cymbalta 30 mg, titrating to 60 mg every day  Excessive daytime sleepiness   He is at risk for obstructive sleep apnea, after discussed with patient, he decided to hold of sleep study at this point,    Marcial Pacas, M.D. Ph.D.  [facility] [place], [place] [address] Phone: [phone] Fax:      [phone]

## 2016-03-08 ENCOUNTER — Encounter: Payer: Self-pay | Admitting: *Deleted

## 2016-07-31 IMAGING — NM NM MYOCAR MULTI W/ SPECT
3 series · 18 of 18 positions shown · non-contrast
Comparison: none

[Series 1: stress_(id)_sa · 6.5mm · 6.51mm/px · 6 of 64 frames shown (1 of 2)]
[frame 6/64]
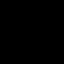
[frame 16/64]
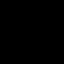
[frame 27/64]
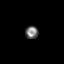
[frame 38/64]
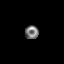
[frame 48/64]
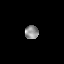
[frame 59/64]
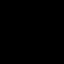

[Series 1: stress_(id)_sa · 6.5mm · 6.51mm/px · 6 of 512 frames shown (2 of 2)]
[frame 43/512]
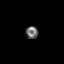
[frame 128/512]
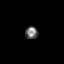
[frame 214/512]
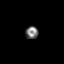
[frame 299/512]
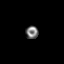
[frame 384/512]
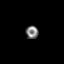
[frame 470/512]
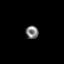

[Series 1: rest_(id)_sa · 6.5mm · 6.51mm/px · 6 of 64 frames shown]
[frame 6/64]
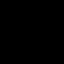
[frame 16/64]
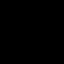
[frame 27/64]
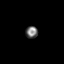
[frame 38/64]
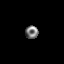
[frame 48/64]
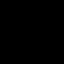
[frame 59/64]
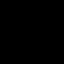

[18 of 18 positions shown; findings below may reference images not displayed]

Canned report from images found in remote index.

Refer to host system for actual result text.

## 2016-12-13 ENCOUNTER — Other Ambulatory Visit: Payer: Self-pay | Admitting: Neurological Surgery

## 2016-12-13 DIAGNOSIS — M4802 Spinal stenosis, cervical region: Secondary | ICD-10-CM

## 2016-12-15 ENCOUNTER — Other Ambulatory Visit (HOSPITAL_COMMUNITY): Payer: Self-pay | Admitting: Otolaryngology

## 2016-12-15 DIAGNOSIS — R1310 Dysphagia, unspecified: Secondary | ICD-10-CM

## 2016-12-21 ENCOUNTER — Ambulatory Visit
Admission: RE | Admit: 2016-12-21 | Discharge: 2016-12-21 | Disposition: A | Discharge status: Home / Self Care | Payer: 59 | Source: Ambulatory Visit | Attending: Neurological Surgery | Admitting: Neurological Surgery

## 2016-12-21 DIAGNOSIS — M4802 Spinal stenosis, cervical region: Secondary | ICD-10-CM

## 2016-12-22 DIAGNOSIS — M4802 Spinal stenosis, cervical region: Secondary | ICD-10-CM | POA: Diagnosis not present

## 2016-12-27 ENCOUNTER — Ambulatory Visit (HOSPITAL_COMMUNITY)
Admission: RE | Admit: 2016-12-27 | Discharge: 2016-12-27 | Disposition: A | Discharge status: Home / Self Care | Payer: 59 | Source: Ambulatory Visit | Attending: Otolaryngology | Admitting: Otolaryngology

## 2016-12-27 DIAGNOSIS — R1314 Dysphagia, pharyngoesophageal phase: Secondary | ICD-10-CM | POA: Diagnosis present

## 2016-12-27 DIAGNOSIS — R1310 Dysphagia, unspecified: Secondary | ICD-10-CM | POA: Diagnosis not present

## 2017-04-13 DIAGNOSIS — M25512 Pain in left shoulder: Secondary | ICD-10-CM | POA: Diagnosis not present

## 2017-04-13 DIAGNOSIS — G8929 Other chronic pain: Secondary | ICD-10-CM | POA: Diagnosis not present

## 2017-04-17 ENCOUNTER — Other Ambulatory Visit: Payer: Self-pay | Admitting: Neurological Surgery

## 2017-04-17 DIAGNOSIS — M25512 Pain in left shoulder: Principal | ICD-10-CM

## 2017-04-17 DIAGNOSIS — G8929 Other chronic pain: Secondary | ICD-10-CM

## 2017-04-24 DIAGNOSIS — N35013 Post-traumatic anterior urethral stricture: Secondary | ICD-10-CM | POA: Diagnosis not present

## 2017-04-24 DIAGNOSIS — N138 Other obstructive and reflux uropathy: Secondary | ICD-10-CM | POA: Diagnosis not present

## 2017-04-24 DIAGNOSIS — N401 Enlarged prostate with lower urinary tract symptoms: Secondary | ICD-10-CM | POA: Diagnosis not present

## 2017-04-27 DIAGNOSIS — W57XXXA Bitten or stung by nonvenomous insect and other nonvenomous arthropods, initial encounter: Secondary | ICD-10-CM | POA: Diagnosis not present

## 2017-04-27 DIAGNOSIS — M545 Low back pain: Secondary | ICD-10-CM | POA: Diagnosis not present

## 2017-05-01 ENCOUNTER — Ambulatory Visit
Admission: RE | Admit: 2017-05-01 | Discharge: 2017-05-01 | Disposition: A | Discharge status: Home / Self Care | Payer: 59 | Source: Ambulatory Visit | Attending: Neurological Surgery | Admitting: Neurological Surgery

## 2017-05-01 DIAGNOSIS — G8929 Other chronic pain: Secondary | ICD-10-CM

## 2017-05-01 DIAGNOSIS — M25512 Pain in left shoulder: Principal | ICD-10-CM

## 2017-05-01 DIAGNOSIS — S4992XA Unspecified injury of left shoulder and upper arm, initial encounter: Secondary | ICD-10-CM | POA: Diagnosis not present

## 2017-05-03 DIAGNOSIS — I10 Essential (primary) hypertension: Secondary | ICD-10-CM | POA: Diagnosis not present

## 2017-05-03 DIAGNOSIS — M25512 Pain in left shoulder: Secondary | ICD-10-CM | POA: Diagnosis not present

## 2017-05-03 DIAGNOSIS — M48061 Spinal stenosis, lumbar region without neurogenic claudication: Secondary | ICD-10-CM | POA: Diagnosis not present

## 2017-05-08 DIAGNOSIS — S46012A Strain of muscle(s) and tendon(s) of the rotator cuff of left shoulder, initial encounter: Secondary | ICD-10-CM | POA: Diagnosis not present

## 2017-05-11 ENCOUNTER — Other Ambulatory Visit: Payer: Self-pay | Admitting: Neurological Surgery

## 2017-05-11 DIAGNOSIS — M48061 Spinal stenosis, lumbar region without neurogenic claudication: Secondary | ICD-10-CM

## 2017-05-12 ENCOUNTER — Ambulatory Visit
Admission: RE | Admit: 2017-05-12 | Discharge: 2017-05-12 | Disposition: A | Discharge status: Home / Self Care | Payer: 59 | Source: Ambulatory Visit | Attending: Neurological Surgery | Admitting: Neurological Surgery

## 2017-05-12 DIAGNOSIS — M48061 Spinal stenosis, lumbar region without neurogenic claudication: Secondary | ICD-10-CM | POA: Diagnosis not present

## 2017-05-17 DIAGNOSIS — M4727 Other spondylosis with radiculopathy, lumbosacral region: Secondary | ICD-10-CM | POA: Diagnosis not present

## 2017-06-02 DIAGNOSIS — M5136 Other intervertebral disc degeneration, lumbar region: Secondary | ICD-10-CM | POA: Diagnosis not present

## 2017-06-02 DIAGNOSIS — M48061 Spinal stenosis, lumbar region without neurogenic claudication: Secondary | ICD-10-CM | POA: Diagnosis not present

## 2017-06-02 DIAGNOSIS — M5416 Radiculopathy, lumbar region: Secondary | ICD-10-CM | POA: Diagnosis not present

## 2017-06-02 DIAGNOSIS — M4726 Other spondylosis with radiculopathy, lumbar region: Secondary | ICD-10-CM | POA: Diagnosis not present

## 2017-06-14 DIAGNOSIS — D696 Thrombocytopenia, unspecified: Secondary | ICD-10-CM | POA: Diagnosis not present

## 2017-06-14 DIAGNOSIS — I10 Essential (primary) hypertension: Secondary | ICD-10-CM | POA: Diagnosis not present

## 2017-06-14 DIAGNOSIS — E782 Mixed hyperlipidemia: Secondary | ICD-10-CM | POA: Diagnosis not present

## 2017-06-14 DIAGNOSIS — Z1389 Encounter for screening for other disorder: Secondary | ICD-10-CM | POA: Diagnosis not present

## 2017-06-14 DIAGNOSIS — Z0000 Encounter for general adult medical examination without abnormal findings: Secondary | ICD-10-CM | POA: Diagnosis not present

## 2017-06-28 ENCOUNTER — Encounter: Payer: Self-pay | Admitting: Cardiology

## 2017-06-29 ENCOUNTER — Ambulatory Visit (INDEPENDENT_AMBULATORY_CARE_PROVIDER_SITE_OTHER): Payer: 59 | Admitting: Cardiology

## 2017-06-29 ENCOUNTER — Encounter: Payer: Self-pay | Admitting: Cardiology

## 2017-06-29 ENCOUNTER — Encounter (INDEPENDENT_AMBULATORY_CARE_PROVIDER_SITE_OTHER): Payer: Self-pay

## 2017-06-29 VITALS — BP 108/58 | HR 79 | Resp 16 | Ht 71.0 in | Wt 226.0 lb

## 2017-06-29 DIAGNOSIS — R001 Bradycardia, unspecified: Secondary | ICD-10-CM

## 2017-06-29 MED ORDER — ATORVASTATIN CALCIUM 40 MG PO TABS: 40.0000 mg | ORAL_TABLET | Freq: Every day | ORAL | 1 refills | Status: DC

## 2017-06-29 NOTE — Progress Notes (Signed)
06/29/2017 Russell Padilla   07-14-1956  [id number]  Primary Physician Sharilyn Sites, MD Primary Cardiologist: Dr. Darcey Nora    Reason for Visit/CC: f/u for CAD  HPI:  Russell Padilla is a 61 y.o. male who presents to clinic for f/u for CAD. He has not been seen since 2016. He presents back for f/u and for medication management.   He has a h/o CAD HTN, HLD, chronic neck and shoulder pain. He was admitted March 2014 with chest pain. LHC 12/05/12: Ostial LAD 20%, proximal LAD 50%, ostial OM1 30%, superior branch of OM2 ostial to proximal 90%, smaller inferior branch proximal 70-80%, non-dominant RCA with diffuse moderate to severe disease throughout the proximal two thirds. He underwent PCI with placement of a Promus Premier DES in the superior branch of the OM2. Medical therapy recommended for the RCA which is a small nondominant vessel. Echocardiogram 12/04/12: Mild LVH, EF 37-16%, grade 1 diastolic dysfunction. Readmitted 01/16/13 with chest pain and repeat cath stable with patent stent. Full pulmonary workup was overall ok. He was evaluated again in 2015 for chest pain. He had a stress test that was intermediate risk study with inferolateral wall defect concerning for ischemia.  He FU with Dr. Lauree Chandler 02/20/15.  LHC was arranged.  This was performed 03/04/15 and demonstrated stable 2 vessel CAD with patent OM2 stent, mod disease in a small non-dominant RCA (no change from last cath), mild LAD and Dx disease, severe stenosis in a very small caliber sub-branch of OM2 (jailed by previous stent and unchanged from prior cath), CTO of OM1 with L>L collats.  Med Rx was recommended.  Today in clinic, he reports that he has done well. He denies any CP or dyspnea. He notes full med compliance. His PCP recently checked FLP and LDL was elevated at 115 mg/dL. He is currently on simvastatin 20.   Current Meds  Medication Sig  . aspirin 81 MG EC tablet Take 81 mg by mouth daily. Swallow whole.  .  cyclobenzaprine (FLEXERIL) 10 MG tablet Take 10 mg by mouth 3 (three) times daily as needed for muscle spasms.  Marland Kitchen dicyclomine (BENTYL) 20 MG tablet Take 20 mg by mouth 4 (four) times daily as needed (IBS).   Marland Kitchen docusate sodium (COLACE) 100 MG capsule Take 100 mg by mouth daily.   . fluticasone (FLONASE) 50 MCG/ACT nasal spray Place 2 sprays into both nostrils daily.  Marland Kitchen gabapentin (NEURONTIN) 300 MG capsule Take 1 tablet PO in the morning and take 2 tablets PO at bedtime.  . methocarbamol (ROBAXIN) 500 MG tablet Take 500 mg by mouth as needed for muscle spasms.  . Multiple Vitamins-Minerals (MULTIVITAMIN WITH MINERALS) tablet Take 1 tablet by mouth daily.  . nitroGLYCERIN (NITROSTAT) 0.4 MG SL tablet Place 1 tablet (0.4 mg total) under the tongue every 5 (five) minutes as needed for chest pain.  Marland Kitchen olmesartan (BENICAR) 40 MG tablet Take 40 mg by mouth daily.  Marland Kitchen oxyCODONE-acetaminophen (PERCOCET/ROXICET) 5-325 MG per tablet Take 1 tablet by mouth every 4 (four) hours as needed for pain.  . pantoprazole (PROTONIX) 40 MG tablet Take 40 mg by mouth 2 (two) times daily.   . Probiotic Product (PROBIOTIC DAILY PO) Take 1 tablet by mouth daily.  Marland Kitchen senna (SENOKOT) 8.6 MG TABS Take 2 tablets by mouth at bedtime.   . sildenafil (VIAGRA) 100 MG tablet Take 100 mg by mouth daily as needed for erectile dysfunction.  . simvastatin (ZOCOR) 40 MG tablet Take 20 mg by  mouth at bedtime. Patient taking one half tablet (20 mg) by mouth every evening  . sucralfate (CARAFATE) 1 GM/10ML suspension Take 1 g by mouth 4 (four) times daily -  with meals and at bedtime.   Allergies  Allergen Reactions  . Crestor [Rosuvastatin Calcium] Other (See Comments)    Aches   . Latex Hives and Rash    When at dentist   Past Medical History:  Diagnosis Date  . Abdominal pain 07-11-13   ongoing and unspecified  . Arthritis   . Bladder stone 07-11-13   surgery planned  . CAD (coronary artery disease)    a. 12/2012 Cath/PCI: DES to  superior branch of OM2;  b. Repeat cath 4/14 with 20% LAD, patent stent OM3, 70% diffuse stenosis small, non-dominant RCA.  . Essential hypertension   . Headache   . HLD (hyperlipidemia)   . Renal disorder    renal calculi  . Stomach pain    chronic   Family History  Problem Relation Age of Onset  . Stomach cancer Mother   . Congestive Heart Failure Father   . Dementia Father   . Heart disease Brother    Past Surgical History:  Procedure Laterality Date  . BACK SURGERY     2 cervical/ 2 lumbar fusions  . CARDIAC CATHETERIZATION N/A 03/04/2015   Procedure: Left Heart Cath and Coronary Angiography;  Surgeon: Burnell Blanks, MD;  Location: [place] CV LAB;  Service: Cardiovascular;  Laterality: N/A;  . CHOLECYSTECTOMY     laparoscopic.  Marland Kitchen CORONARY STENT PLACEMENT     3'14  . ESOPHAGOGASTRODUODENOSCOPY ENDOSCOPY     multiple times-[facility]/ and now being followed by Endo- [facility]  . KNEE ARTHROSCOPY Left    '80's  . LEFT HEART CATHETERIZATION WITH CORONARY ANGIOGRAM N/A 12/05/2012   Procedure: LEFT HEART CATHETERIZATION WITH CORONARY ANGIOGRAM;  Surgeon: Birdie Riddle, MD;  Location: [place] CATH LAB;  Service: Cardiovascular;  Laterality: N/A;  . LEFT HEART CATHETERIZATION WITH CORONARY ANGIOGRAM N/A 01/17/2013   Procedure: LEFT HEART CATHETERIZATION WITH CORONARY ANGIOGRAM;  Surgeon: Wellington Hampshire, MD;  Location: [place] CATH LAB;  Service: Cardiovascular;  Laterality: N/A;  . PERCUTANEOUS CORONARY STENT INTERVENTION (PCI-S) N/A 12/06/2012   Procedure: PERCUTANEOUS CORONARY STENT INTERVENTION (PCI-S);  Surgeon: Burnell Blanks, MD;  Location: [facility] CATH LAB;  Service: Cardiovascular;  Laterality: N/A;   Social History   Social History  . Marital status: Married    Spouse name: N/A  . Number of children: 0  . Years of education: 14   Occupational History  . Retired    Social History Main Topics  . Smoking status: Former Smoker    Packs/day: 0.50    Years: 30.00     Types: Cigarettes    Quit date: 12/04/2012  . Smokeless tobacco: Never Used  . Alcohol use No  . Drug use: No  . Sexual activity: Not on file   Other Topics Concern  . Not on file   Social History Narrative   Lives at home with his wife, Regino Schultze.   Right-handed.   4-5 cans diet colas and occasional glasses of tea per day.   Retired 2007.     Review of Systems: General: negative for chills, fever, night sweats or weight changes.  Cardiovascular: negative for chest pain, dyspnea on exertion, edema, orthopnea, palpitations, paroxysmal nocturnal dyspnea or shortness of breath Dermatological: negative for rash Respiratory: negative for cough or wheezing Urologic: negative for hematuria Abdominal: negative for nausea,  vomiting, diarrhea, bright red blood per rectum, melena, or hematemesis Neurologic: negative for visual changes, syncope, or dizziness All other systems reviewed and are otherwise negative except as noted above.   Physical Exam:  Blood pressure (!) 108/58, pulse 79, resp. rate 16, height 5\' 11"  (1.803 m), weight 226 lb (102.5 kg), SpO2 97 %.  General appearance: alert, cooperative and no distress Neck: no carotid bruit and no JVD Lungs: clear to auscultation bilaterally Heart: regular rate and rhythm, S1, S2 normal, no murmur, click, rub or gallop Extremities: extremities normal, atraumatic, no cyanosis or edema Pulses: 2+ and symmetric Skin: Skin color, texture, turgor normal. No rashes or lesions Neurologic: Grossly normal  EKG sinus bradycardia 57 bpm-- personally reviewed   ASSESSMENT AND PLAN:   1. CAD: stable w/o anginal symptoms. Continue medical therapy.   2. HTN: controlled on current regimen.   3. HLD: recent lipid panel was checked at PCP office. LDL not at goal. LDL elevated at 115 mg/dL. Goal in the setting of CAD is < 70 mg/dL. He has been on simvastatin 20. Will change to Lipitor 40 mg. Check FLP and HFTs in 8 weeks.    Follow-Up w/ Dr. Angelena Form  in 6 months   Brittainy Ladoris Gene, MHS [facility] 06/29/2017 3:02 PM

## 2017-06-29 NOTE — Patient Instructions (Addendum)
Medication Instructions:   STOP TAKING SIMVASTATIN   START TAKING  ATORVASTATIN 40 MG ONCE A DAY   If you need a refill on your cardiac medications before your next appointment, please call your pharmacy.  Labwork:  RETURN TO YOUR PCP FOR FASTING LIPID  AND LFT FAX RESULTS TO [phone]   Testing/Procedures: NONE ORDERED  TODAY    Follow-Up: Your physician wants you to follow-up in:  IN [place] will receive a reminder letter in the mail two months in advance. If you don't receive a letter, please call our office to schedule the follow-up appointment.      Any Other Special Instructions Will Be Listed Below (If Applicable).

## 2017-07-05 DIAGNOSIS — Z23 Encounter for immunization: Secondary | ICD-10-CM | POA: Diagnosis not present

## 2017-08-28 ENCOUNTER — Other Ambulatory Visit: Payer: Self-pay

## 2017-08-28 ENCOUNTER — Other Ambulatory Visit: Payer: Self-pay | Admitting: Cardiology

## 2017-08-28 MED ORDER — ATORVASTATIN CALCIUM 40 MG PO TABS: 40.0000 mg | ORAL_TABLET | Freq: Every day | ORAL | 3 refills | Status: DC

## 2017-08-28 MED ORDER — ATORVASTATIN CALCIUM 40 MG PO TABS: 40.0000 mg | ORAL_TABLET | Freq: Every day | ORAL | 1 refills | Status: DC

## 2017-09-13 DIAGNOSIS — D696 Thrombocytopenia, unspecified: Secondary | ICD-10-CM | POA: Diagnosis not present

## 2017-09-13 DIAGNOSIS — Z0000 Encounter for general adult medical examination without abnormal findings: Secondary | ICD-10-CM | POA: Diagnosis not present

## 2017-09-13 DIAGNOSIS — Z1389 Encounter for screening for other disorder: Secondary | ICD-10-CM | POA: Diagnosis not present

## 2017-10-02 DIAGNOSIS — M50221 Other cervical disc displacement at C4-C5 level: Secondary | ICD-10-CM | POA: Diagnosis not present

## 2017-10-02 DIAGNOSIS — M4722 Other spondylosis with radiculopathy, cervical region: Secondary | ICD-10-CM | POA: Diagnosis not present

## 2017-10-02 DIAGNOSIS — M50222 Other cervical disc displacement at C5-C6 level: Secondary | ICD-10-CM | POA: Diagnosis not present

## 2017-10-02 DIAGNOSIS — M5412 Radiculopathy, cervical region: Secondary | ICD-10-CM | POA: Diagnosis not present

## 2017-10-18 DIAGNOSIS — H16223 Keratoconjunctivitis sicca, not specified as Sjogren's, bilateral: Secondary | ICD-10-CM | POA: Diagnosis not present

## 2017-10-18 DIAGNOSIS — H11823 Conjunctivochalasis, bilateral: Secondary | ICD-10-CM | POA: Diagnosis not present

## 2017-10-18 DIAGNOSIS — H11152 Pinguecula, left eye: Secondary | ICD-10-CM | POA: Diagnosis not present

## 2017-10-19 DIAGNOSIS — M4802 Spinal stenosis, cervical region: Secondary | ICD-10-CM | POA: Diagnosis not present

## 2017-10-27 DIAGNOSIS — N182 Chronic kidney disease, stage 2 (mild): Secondary | ICD-10-CM | POA: Diagnosis not present

## 2017-10-27 DIAGNOSIS — N400 Enlarged prostate without lower urinary tract symptoms: Secondary | ICD-10-CM | POA: Diagnosis not present

## 2017-11-16 ENCOUNTER — Other Ambulatory Visit: Payer: Self-pay | Admitting: Cardiovascular Disease

## 2017-11-16 DIAGNOSIS — Z7689 Persons encountering health services in other specified circumstances: Secondary | ICD-10-CM | POA: Diagnosis not present

## 2017-11-17 LAB — LIPID PANEL W/O CHOL/HDL RATIO
Cholesterol, Total: 147 mg/dL (ref 100–199)
HDL: 50 mg/dL (ref >39)
LDL CALC: 77 mg/dL (ref 0–99)
Triglycerides: 101 mg/dL (ref 0–149)
VLDL Cholesterol Cal: 20 mg/dL (ref 5–40)

## 2017-11-17 LAB — HEPATIC FUNCTION PANEL
ALBUMIN: 4.3 g/dL (ref 3.6–4.8)
ALK PHOS: 69 IU/L (ref 39–117)
ALT: 18 IU/L (ref 0–44)
AST: 13 IU/L (ref 0–40)
BILIRUBIN, DIRECT: 0.14 mg/dL (ref 0.00–0.40)
Bilirubin Total: 0.5 mg/dL (ref 0.0–1.2)
TOTAL PROTEIN: 6.5 g/dL (ref 6.0–8.5)

## 2017-11-17 LAB — SPECIMEN STATUS REPORT

## 2017-11-20 ENCOUNTER — Telehealth: Payer: Self-pay

## 2017-11-20 DIAGNOSIS — E785 Hyperlipidemia, unspecified: Secondary | ICD-10-CM

## 2017-11-20 MED ORDER — SIMVASTATIN 20 MG PO TABS: 20.0000 mg | ORAL_TABLET | Freq: Every day | ORAL | 3 refills | Status: DC

## 2017-11-20 NOTE — Telephone Encounter (Signed)
-----   Message from Burnell Blanks, MD sent at 11/20/2017  9:42 AM EST ----- Lipids near goal. LFTs ok. Can we let him know? Thanks, chris

## 2017-11-20 NOTE — Telephone Encounter (Signed)
Patient made aware of results. Patient verbalizes understanding. Patient states that he has not tolerated the atorvastatin an has had severe muscle cramps. Patient wishes to switch back to simvastatin 20 mg QD. Discussed with Dr. Angelena Form who is in agreement. Will recheck fasting LIPIDS and LFTs in 12 weeks in [place]. Rx sent to [facility] per patient's request.   Requisition for labs to be done in [place] have been mailed to the patient.

## 2017-12-04 DIAGNOSIS — S46012D Strain of muscle(s) and tendon(s) of the rotator cuff of left shoulder, subsequent encounter: Secondary | ICD-10-CM | POA: Diagnosis not present

## 2017-12-12 DIAGNOSIS — L821 Other seborrheic keratosis: Secondary | ICD-10-CM | POA: Diagnosis not present

## 2017-12-12 DIAGNOSIS — L570 Actinic keratosis: Secondary | ICD-10-CM | POA: Diagnosis not present

## 2017-12-12 DIAGNOSIS — D225 Melanocytic nevi of trunk: Secondary | ICD-10-CM | POA: Diagnosis not present

## 2017-12-12 DIAGNOSIS — L814 Other melanin hyperpigmentation: Secondary | ICD-10-CM | POA: Diagnosis not present

## 2017-12-12 DIAGNOSIS — D485 Neoplasm of uncertain behavior of skin: Secondary | ICD-10-CM | POA: Diagnosis not present

## 2017-12-12 DIAGNOSIS — L439 Lichen planus, unspecified: Secondary | ICD-10-CM | POA: Diagnosis not present

## 2018-01-05 ENCOUNTER — Other Ambulatory Visit: Payer: Self-pay | Admitting: Neurological Surgery

## 2018-01-05 DIAGNOSIS — M5412 Radiculopathy, cervical region: Secondary | ICD-10-CM

## 2018-01-10 ENCOUNTER — Ambulatory Visit
Admission: RE | Admit: 2018-01-10 | Discharge: 2018-01-10 | Disposition: A | Discharge status: Home / Self Care | Payer: 59 | Source: Ambulatory Visit | Attending: Neurological Surgery | Admitting: Neurological Surgery

## 2018-01-10 DIAGNOSIS — M5412 Radiculopathy, cervical region: Secondary | ICD-10-CM

## 2018-01-10 DIAGNOSIS — M4322 Fusion of spine, cervical region: Secondary | ICD-10-CM | POA: Diagnosis not present

## 2018-01-11 DIAGNOSIS — M4802 Spinal stenosis, cervical region: Secondary | ICD-10-CM | POA: Diagnosis not present

## 2018-01-11 DIAGNOSIS — I10 Essential (primary) hypertension: Secondary | ICD-10-CM | POA: Diagnosis not present

## 2018-01-18 DIAGNOSIS — M47812 Spondylosis without myelopathy or radiculopathy, cervical region: Secondary | ICD-10-CM | POA: Diagnosis not present

## 2018-01-23 DIAGNOSIS — Z6831 Body mass index (BMI) 31.0-31.9, adult: Secondary | ICD-10-CM | POA: Diagnosis not present

## 2018-01-23 DIAGNOSIS — J069 Acute upper respiratory infection, unspecified: Secondary | ICD-10-CM | POA: Diagnosis not present

## 2018-01-23 DIAGNOSIS — E6609 Other obesity due to excess calories: Secondary | ICD-10-CM | POA: Diagnosis not present

## 2018-02-05 DIAGNOSIS — M47812 Spondylosis without myelopathy or radiculopathy, cervical region: Secondary | ICD-10-CM | POA: Diagnosis not present

## 2018-04-20 DIAGNOSIS — N182 Chronic kidney disease, stage 2 (mild): Secondary | ICD-10-CM | POA: Diagnosis not present

## 2018-04-30 DIAGNOSIS — L821 Other seborrheic keratosis: Secondary | ICD-10-CM | POA: Diagnosis not present

## 2018-04-30 DIAGNOSIS — D0462 Carcinoma in situ of skin of left upper limb, including shoulder: Secondary | ICD-10-CM | POA: Diagnosis not present

## 2018-05-08 DIAGNOSIS — N35912 Unspecified bulbous urethral stricture, male: Secondary | ICD-10-CM | POA: Diagnosis not present

## 2018-05-08 DIAGNOSIS — N35014 Post-traumatic urethral stricture, male, unspecified: Secondary | ICD-10-CM | POA: Diagnosis not present

## 2018-05-14 DIAGNOSIS — S46012D Strain of muscle(s) and tendon(s) of the rotator cuff of left shoulder, subsequent encounter: Secondary | ICD-10-CM | POA: Diagnosis not present

## 2018-06-07 DIAGNOSIS — E6609 Other obesity due to excess calories: Secondary | ICD-10-CM | POA: Diagnosis not present

## 2018-06-07 DIAGNOSIS — I2510 Atherosclerotic heart disease of native coronary artery without angina pectoris: Secondary | ICD-10-CM | POA: Diagnosis not present

## 2018-06-07 DIAGNOSIS — Z0000 Encounter for general adult medical examination without abnormal findings: Secondary | ICD-10-CM | POA: Diagnosis not present

## 2018-06-15 DIAGNOSIS — B09 Unspecified viral infection characterized by skin and mucous membrane lesions: Secondary | ICD-10-CM | POA: Diagnosis not present

## 2018-06-15 DIAGNOSIS — Z6831 Body mass index (BMI) 31.0-31.9, adult: Secondary | ICD-10-CM | POA: Diagnosis not present

## 2018-06-15 DIAGNOSIS — J329 Chronic sinusitis, unspecified: Secondary | ICD-10-CM | POA: Diagnosis not present

## 2018-07-16 DIAGNOSIS — L570 Actinic keratosis: Secondary | ICD-10-CM | POA: Diagnosis not present

## 2018-07-30 DIAGNOSIS — Z23 Encounter for immunization: Secondary | ICD-10-CM | POA: Diagnosis not present

## 2018-07-30 DIAGNOSIS — D696 Thrombocytopenia, unspecified: Secondary | ICD-10-CM | POA: Diagnosis not present

## 2018-07-30 DIAGNOSIS — I10 Essential (primary) hypertension: Secondary | ICD-10-CM | POA: Diagnosis not present

## 2018-07-30 DIAGNOSIS — Z1389 Encounter for screening for other disorder: Secondary | ICD-10-CM | POA: Diagnosis not present

## 2018-07-30 DIAGNOSIS — Z0000 Encounter for general adult medical examination without abnormal findings: Secondary | ICD-10-CM | POA: Diagnosis not present

## 2018-08-03 DIAGNOSIS — N182 Chronic kidney disease, stage 2 (mild): Secondary | ICD-10-CM | POA: Diagnosis not present

## 2018-08-03 DIAGNOSIS — N35014 Post-traumatic urethral stricture, male, unspecified: Secondary | ICD-10-CM | POA: Diagnosis not present

## 2018-08-03 DIAGNOSIS — N400 Enlarged prostate without lower urinary tract symptoms: Secondary | ICD-10-CM | POA: Diagnosis not present

## 2018-09-04 DIAGNOSIS — L570 Actinic keratosis: Secondary | ICD-10-CM | POA: Diagnosis not present

## 2018-09-20 DIAGNOSIS — E6609 Other obesity due to excess calories: Secondary | ICD-10-CM | POA: Diagnosis not present

## 2018-09-20 DIAGNOSIS — B349 Viral infection, unspecified: Secondary | ICD-10-CM | POA: Diagnosis not present

## 2018-09-20 DIAGNOSIS — Z6830 Body mass index (BMI) 30.0-30.9, adult: Secondary | ICD-10-CM | POA: Diagnosis not present

## 2018-09-20 DIAGNOSIS — Z1389 Encounter for screening for other disorder: Secondary | ICD-10-CM | POA: Diagnosis not present

## 2018-09-20 DIAGNOSIS — J069 Acute upper respiratory infection, unspecified: Secondary | ICD-10-CM | POA: Diagnosis not present

## 2018-10-08 DIAGNOSIS — J040 Acute laryngitis: Secondary | ICD-10-CM | POA: Diagnosis not present

## 2018-10-08 DIAGNOSIS — E6609 Other obesity due to excess calories: Secondary | ICD-10-CM | POA: Diagnosis not present

## 2018-10-08 DIAGNOSIS — J22 Unspecified acute lower respiratory infection: Secondary | ICD-10-CM | POA: Diagnosis not present

## 2018-10-24 DIAGNOSIS — H11152 Pinguecula, left eye: Secondary | ICD-10-CM | POA: Diagnosis not present

## 2018-10-24 DIAGNOSIS — H2513 Age-related nuclear cataract, bilateral: Secondary | ICD-10-CM | POA: Diagnosis not present

## 2018-10-24 DIAGNOSIS — H11823 Conjunctivochalasis, bilateral: Secondary | ICD-10-CM | POA: Diagnosis not present

## 2018-11-14 ENCOUNTER — Ambulatory Visit (HOSPITAL_COMMUNITY)
Admission: RE | Admit: 2018-11-14 | Discharge: 2018-11-14 | Disposition: A | Discharge status: Home / Self Care | Payer: 59 | Source: Ambulatory Visit | Attending: Family Medicine | Admitting: Family Medicine

## 2018-11-14 ENCOUNTER — Other Ambulatory Visit (HOSPITAL_COMMUNITY): Payer: Self-pay | Admitting: Family Medicine

## 2018-11-14 DIAGNOSIS — J069 Acute upper respiratory infection, unspecified: Secondary | ICD-10-CM | POA: Diagnosis not present

## 2018-11-14 DIAGNOSIS — R079 Chest pain, unspecified: Secondary | ICD-10-CM | POA: Diagnosis not present

## 2018-11-14 DIAGNOSIS — R05 Cough: Secondary | ICD-10-CM | POA: Diagnosis not present

## 2018-11-14 DIAGNOSIS — E6609 Other obesity due to excess calories: Secondary | ICD-10-CM | POA: Diagnosis not present

## 2018-11-14 DIAGNOSIS — Z6830 Body mass index (BMI) 30.0-30.9, adult: Secondary | ICD-10-CM | POA: Diagnosis not present

## 2019-02-18 DIAGNOSIS — S46012A Strain of muscle(s) and tendon(s) of the rotator cuff of left shoulder, initial encounter: Secondary | ICD-10-CM | POA: Diagnosis not present

## 2019-02-18 DIAGNOSIS — M5412 Radiculopathy, cervical region: Secondary | ICD-10-CM | POA: Diagnosis not present

## 2019-05-21 ENCOUNTER — Other Ambulatory Visit: Payer: Self-pay

## 2019-05-21 DIAGNOSIS — Z20822 Contact with and (suspected) exposure to covid-19: Secondary | ICD-10-CM

## 2019-05-23 LAB — NOVEL CORONAVIRUS, NAA: SARS-CoV-2, NAA: NOT DETECTED

## 2019-08-08 ENCOUNTER — Other Ambulatory Visit (HOSPITAL_COMMUNITY): Payer: Self-pay | Admitting: Neurological Surgery

## 2019-08-08 ENCOUNTER — Other Ambulatory Visit: Payer: Self-pay | Admitting: Neurological Surgery

## 2019-08-08 DIAGNOSIS — M5412 Radiculopathy, cervical region: Secondary | ICD-10-CM

## 2019-08-08 DIAGNOSIS — M5416 Radiculopathy, lumbar region: Secondary | ICD-10-CM

## 2019-08-08 DIAGNOSIS — M5414 Radiculopathy, thoracic region: Secondary | ICD-10-CM

## 2019-09-04 ENCOUNTER — Ambulatory Visit (HOSPITAL_COMMUNITY): Payer: 59

## 2019-09-04 ENCOUNTER — Encounter (HOSPITAL_COMMUNITY): Payer: Self-pay

## 2020-01-15 ENCOUNTER — Other Ambulatory Visit: Payer: Self-pay

## 2020-01-15 ENCOUNTER — Encounter: Payer: Self-pay | Admitting: Cardiovascular Disease

## 2020-01-15 ENCOUNTER — Ambulatory Visit: Payer: Medicare Other | Admitting: Cardiovascular Disease

## 2020-01-15 VITALS — BP 126/76 | HR 59 | Ht 71.0 in | Wt 225.0 lb

## 2020-01-15 DIAGNOSIS — I6523 Occlusion and stenosis of bilateral carotid arteries: Secondary | ICD-10-CM

## 2020-01-15 DIAGNOSIS — E7800 Pure hypercholesterolemia, unspecified: Secondary | ICD-10-CM

## 2020-01-15 DIAGNOSIS — I2510 Atherosclerotic heart disease of native coronary artery without angina pectoris: Secondary | ICD-10-CM | POA: Diagnosis not present

## 2020-01-15 DIAGNOSIS — I10 Essential (primary) hypertension: Secondary | ICD-10-CM | POA: Diagnosis not present

## 2020-01-15 MED ORDER — NITROGLYCERIN 0.4 MG SL SUBL: 0.4000 mg | SUBLINGUAL_TABLET | SUBLINGUAL | 5 refills | Status: DC | PRN

## 2020-01-15 NOTE — Progress Notes (Signed)
Chief Complaint  Patient presents with  . Follow-up    CAD   History of Present Illness: 64 yo male with history of CAD, HTN, HLD, chronic neck and shoulder pain who is here today for cardiac follow up. He was admitted March 2014 with chest pain. Cardiac cath March 2014 with ostial LAD 20%, proximal LAD 50%, ostial OM1 30%, superior branch of OM2 ostial to proximal 90%, smaller inferior branch proximal 70-80%, non-dominant RCA with diffuse moderate to severe disease throughout the proximal two thirds. A drug eluting stent was placed in the second obtuse marginal. Echo March 2014 with mild LVH, EF 123456, grade 1 diastolic dysfunction. Readmitted 01/16/13 with chest pain and repeat cath stable with patent stent. Full pulmonary workup was overall ok.  He has seen a Hematologist for thrombocytopenia and workup was ok. He was seen her in our office Feb 05, 2015 by Ignacia Bayley, NP and had c/o diaphoresis, left sided chest pain that was reproducible with movement. Stress myoview 02/17/15 was an intermediate risk study with inferolateral wall defect concerning for ischemia. Cardiac cath May 2016 with stable CAD. Mild LAD stenosis.  CTO of OM1 with left to left collaterals.   He is here today for follow up. The patient denies any exertional chest pain, dyspnea, palpitations, lower extremity edema, orthopnea, PND, dizziness, near syncope or syncope. Overall feeling well. He has frequent belching that relieves epigastric pain. Followed in GI clinic for esophageal stricture.   Primary Care Physician: Sharilyn Sites, MD  Past Medical History:  Diagnosis Date  . Abdominal pain 07-11-13   ongoing and unspecified  . Arthritis   . Bladder stone 07-11-13   surgery planned  . CAD (coronary artery disease)    a. 12/2012 Cath/PCI: DES to superior branch of OM2;  b. Repeat cath 4/14 with 20% LAD, patent stent OM3, 70% diffuse stenosis small, non-dominant RCA.  . Essential hypertension   . Headache   . HLD  (hyperlipidemia)   . Renal disorder    renal calculi  . Stomach pain    chronic    Past Surgical History:  Procedure Laterality Date  . BACK SURGERY     2 cervical/ 2 lumbar fusions  . CARDIAC CATHETERIZATION N/A 03/04/2015   Procedure: Left Heart Cath and Coronary Angiography;  Surgeon: Burnell Blanks, MD;  Location: [place] [facility];  Service: Cardiovascular;  Laterality: N/A;  . CHOLECYSTECTOMY     laparoscopic.  Marland Kitchen CORONARY STENT PLACEMENT     3'14  . ESOPHAGOGASTRODUODENOSCOPY ENDOSCOPY     multiple times-[facility]/ and now being followed by Endo- [facility]  . KNEE ARTHROSCOPY Left    '80's  . LEFT HEART CATHETERIZATION WITH CORONARY ANGIOGRAM N/A 12/05/2012   Procedure: LEFT HEART CATHETERIZATION WITH CORONARY ANGIOGRAM;  Surgeon: Birdie Riddle, MD;  Location: [place] [facility];  Service: Cardiovascular;  Laterality: N/A;  . LEFT HEART CATHETERIZATION WITH CORONARY ANGIOGRAM N/A 01/17/2013   Procedure: LEFT HEART CATHETERIZATION WITH CORONARY ANGIOGRAM;  Surgeon: Wellington Hampshire, MD;  Location: [place] [facility];  Service: Cardiovascular;  Laterality: N/A;  . PERCUTANEOUS CORONARY STENT INTERVENTION (PCI-S) N/A 12/06/2012   Procedure: PERCUTANEOUS CORONARY STENT INTERVENTION (PCI-S);  Surgeon: Burnell Blanks, MD;  Location: [facility] C[facility];  Service: Cardiovascular;  Laterality: N/A;    Current Outpatient Medications  Medication Sig Dispense Refill  . aspirin 81 MG EC tablet Take 81 mg by mouth daily. Swallow whole.    . dicyclomine (BENTYL) 20 MG tablet Take 20 mg  by mouth 4 (four) times daily as needed (IBS).     Marland Kitchen docusate sodium (COLACE) 100 MG capsule Take 100 mg by mouth daily.     . fluticasone (FLONASE) 50 MCG/ACT nasal spray Place 2 sprays into both nostrils daily.    Marland Kitchen gabapentin (NEURONTIN) 300 MG capsule Take 1 tablet PO in the morning and take 2 tablets PO at bedtime. 270 capsule 3  . methocarbamol (ROBAXIN) 500 MG tablet Take 500 mg by mouth as needed for muscle  spasms.    . Multiple Vitamins-Minerals (MULTIVITAMIN WITH MINERALS) tablet Take 1 tablet by mouth daily.    Marland Kitchen olmesartan (BENICAR) 40 MG tablet Take 40 mg by mouth daily.    Marland Kitchen oxyCODONE-acetaminophen (PERCOCET/ROXICET) 5-325 MG per tablet Take 1 tablet by mouth every 4 (four) hours as needed for pain.    . pantoprazole (PROTONIX) 40 MG tablet Take 40 mg by mouth 2 (two) times daily.     Marland Kitchen senna (SENOKOT) 8.6 MG TABS Take 2 tablets by mouth at bedtime.     . sildenafil (VIAGRA) 100 MG tablet Take 100 mg by mouth daily as needed for erectile dysfunction.    . simvastatin (ZOCOR) 20 MG tablet Take 1 tablet (20 mg total) by mouth at bedtime. 90 tablet 3  . sucralfate (CARAFATE) 1 GM/10ML suspension Take 1 g by mouth 4 (four) times daily -  with meals and at bedtime.    . nitroGLYCERIN (NITROSTAT) 0.4 MG SL tablet Place 1 tablet (0.4 mg total) under the tongue every 5 (five) minutes as needed for chest pain. 25 tablet 5   No current facility-administered medications for this visit.    Allergies  Allergen Reactions  . Crestor [Rosuvastatin Calcium] Other (See Comments)    Aches   . Latex Hives and Rash    When at dentist    Social History   Socioeconomic History  . Marital status: Married    Spouse name: Not on file  . Number of children: 0  . Years of education: 43  . Highest education level: Not on file  Occupational History  . Occupation: Retired  Tobacco Use  . Smoking status: Former Smoker    Packs/day: 0.50    Years: 30.00    Pack years: 15.00    Types: Cigarettes    Quit date: 12/04/2012    Years since quitting: 7.1  . Smokeless tobacco: Never Used  Substance and Sexual Activity  . Alcohol use: No    Alcohol/week: 0.0 standard drinks  . Drug use: No  . Sexual activity: Not on file  Other Topics Concern  . Not on file  Social History Narrative   Lives at home with his wife, Regino Schultze.   Right-handed.   4-5 cans diet colas and occasional glasses of tea per day.    Retired 2007.   Social Determinants of Health   Financial Resource Strain:   . Difficulty of Paying Living Expenses:   Food Insecurity:   . Worried About Charity fundraiser in the Last Year:   . Arboriculturist in the Last Year:   Transportation Needs:   . Film/video editor (Medical):   Marland Kitchen Lack of Transportation (Non-Medical):   Physical Activity:   . Days of Exercise per Week:   . Minutes of Exercise per Session:   Stress:   . Feeling of Stress :   Social Connections:   . Frequency of Communication with Friends and Family:   . Frequency of Social  Gatherings with Friends and Family:   . Attends Religious Services:   . Active Member of Clubs or Organizations:   . Attends Archivist Meetings:   Marland Kitchen Marital Status:   Intimate Partner Violence:   . Fear of Current or Ex-Partner:   . Emotionally Abused:   Marland Kitchen Physically Abused:   . Sexually Abused:     Family History  Problem Relation Age of Onset  . Stomach cancer Mother   . Congestive Heart Failure Father   . Dementia Father   . Heart disease Brother     Review of Systems:  As stated in the HPI and otherwise negative.   BP 126/76   Pulse (!) 59   Ht 5\' 11"  (1.803 m)   Wt 225 lb (102.1 kg)   SpO2 95%   BMI 31.38 kg/m   Physical Examination:.  General: Well developed, well nourished, NAD  HEENT: OP clear, mucus membranes moist  SKIN: warm, dry. No rashes. Neuro: No focal deficits  Musculoskeletal: Muscle strength 5/5 all ext  Psychiatric: Mood and affect normal  Neck: No JVD, no carotid bruits, no thyromegaly, no lymphadenopathy.  Lungs:Clear bilaterally, no wheezes, rhonci, crackles Cardiovascular: Regular rate and rhythm. No murmurs, gallops or rubs. Abdomen:Soft. Bowel sounds present. Non-tender.  Extremities: No lower extremity edema. Pulses are 2 + in the bilateral DP/PT.  EKG:  EKG is ordered today. The ekg ordered today demonstrates NSR, rate 59 bpm  Recent Labs: No results found for  requested labs within last 8760 hours.   Lipid Panel    Component Value Date/Time   CHOL 147 11/16/2017 1635   TRIG 101 11/16/2017 1635   HDL 50 11/16/2017 1635   LDLCALC 77 11/16/2017 1635     Wt Readings from Last 3 Encounters:  01/15/20 225 lb (102.1 kg)  06/29/17 226 lb (102.5 kg)  03/01/16 236 lb 8 oz (107.3 kg)     Other studies Reviewed: Additional studies/ records that were reviewed today include: . Review of the above records demonstrates:    Assessment and Plan:   1. CAD without angina: No chest pain. Continue ASA and statin.   2. HTN: BP controlled. Continue current therapy  3. HLD: Lipids controlled in primary care. Continue statin.   4. Carotid artery disease: Known to have mild disease by screening dopplers in June 2016. Will repeat carotid artery dopplers now.   Current medicines are reviewed at length with the patient today.  The patient does not have concerns regarding medicines.  The following changes have been made:  no change  Labs/ tests ordered today include:   Orders Placed This Encounter  Procedures  . EKG 12-Lead  . VAS US CAROTID    Disposition:   FU with me in 12 months.   Signed, Lauree Chandler, MD 01/15/2020 5:18 PM    [facility] [address], [place], [place]  [address] Phone: [phone]; Fax: [phone]

## 2020-01-15 NOTE — Patient Instructions (Signed)
Medication Instructions:  No changes *If you need a refill on your cardiac medications before your next appointment, please call your pharmacy*   Lab Work: none If you have labs (blood work) drawn today and your tests are completely normal, you will receive your results only by: . MyChart Message (if you have MyChart) OR . A paper copy in the mail If you have any lab test that is abnormal or we need to change your treatment, we will call you to review the results.   Testing/Procedures: Your physician has requested that you have a carotid duplex. This test is an ultrasound of the carotid arteries in your neck. It looks at blood flow through these arteries that supply the brain with blood. Allow one hour for this exam. There are no restrictions or special instructions.   Follow-Up: At [facility], you and your health needs are our priority.  As part of our continuing mission to provide you with exceptional heart care, we have created designated Provider Care Teams.  These Care Teams include your primary Cardiologist (physician) and Advanced Practice Providers (APPs -  Physician Assistants and Nurse Practitioners) who all work together to provide you with the care you need, when you need it.  We recommend signing up for the patient portal called "MyChart".  Sign up information is provided on this After Visit Summary.  MyChart is used to connect with patients for Virtual Visits (Telemedicine).  Patients are able to view lab/test results, encounter notes, upcoming appointments, etc.  Non-urgent messages can be sent to your provider as well.   To learn more about what you can do with MyChart, go to [email or web address].    Your next appointment:   12 month(s)  The format for your next appointment:   In Person  Provider:   You may see Christopher McAlhany, MD or one of the following Advanced Practice Providers on your designated Care Team:    [name] Dunn, PA-C  Michele Lenze,  PA-C    Other Instructions   

## 2020-01-27 ENCOUNTER — Other Ambulatory Visit: Payer: Self-pay | Admitting: Cardiovascular Disease

## 2020-01-27 DIAGNOSIS — I6523 Occlusion and stenosis of bilateral carotid arteries: Secondary | ICD-10-CM

## 2020-02-04 ENCOUNTER — Ambulatory Visit (HOSPITAL_COMMUNITY)
Admission: RE | Admit: 2020-02-04 | Discharge: 2020-02-04 | Disposition: A | Discharge status: Home / Self Care | Payer: Medicare Other | Source: Ambulatory Visit | Attending: Cardiovascular Disease | Admitting: Cardiovascular Disease

## 2020-02-04 ENCOUNTER — Other Ambulatory Visit: Payer: Self-pay

## 2020-02-04 DIAGNOSIS — I6523 Occlusion and stenosis of bilateral carotid arteries: Secondary | ICD-10-CM | POA: Insufficient documentation

## 2020-02-12 ENCOUNTER — Other Ambulatory Visit: Payer: Self-pay | Admitting: Neurological Surgery

## 2020-02-12 ENCOUNTER — Other Ambulatory Visit (HOSPITAL_COMMUNITY): Payer: Self-pay | Admitting: Neurological Surgery

## 2020-02-12 DIAGNOSIS — M5412 Radiculopathy, cervical region: Secondary | ICD-10-CM

## 2020-03-11 ENCOUNTER — Ambulatory Visit (HOSPITAL_COMMUNITY)
Admission: RE | Admit: 2020-03-11 | Discharge: 2020-03-11 | Disposition: A | Discharge status: Home / Self Care | Payer: Medicare Other | Source: Ambulatory Visit | Attending: Neurological Surgery | Admitting: Neurological Surgery

## 2020-03-11 ENCOUNTER — Other Ambulatory Visit: Payer: Self-pay

## 2020-03-11 DIAGNOSIS — N200 Calculus of kidney: Secondary | ICD-10-CM | POA: Diagnosis not present

## 2020-03-11 DIAGNOSIS — M5116 Intervertebral disc disorders with radiculopathy, lumbar region: Secondary | ICD-10-CM | POA: Diagnosis not present

## 2020-03-11 DIAGNOSIS — M5412 Radiculopathy, cervical region: Secondary | ICD-10-CM

## 2020-03-11 DIAGNOSIS — M4802 Spinal stenosis, cervical region: Secondary | ICD-10-CM | POA: Diagnosis not present

## 2020-03-11 DIAGNOSIS — I2510 Atherosclerotic heart disease of native coronary artery without angina pectoris: Secondary | ICD-10-CM | POA: Diagnosis not present

## 2020-03-11 DIAGNOSIS — M48062 Spinal stenosis, lumbar region with neurogenic claudication: Secondary | ICD-10-CM | POA: Diagnosis not present

## 2020-03-11 DIAGNOSIS — M4712 Other spondylosis with myelopathy, cervical region: Secondary | ICD-10-CM | POA: Insufficient documentation

## 2020-03-11 DIAGNOSIS — Z981 Arthrodesis status: Secondary | ICD-10-CM | POA: Insufficient documentation

## 2020-03-11 MED ORDER — DEXAMETHASONE 4 MG PO TABS
4.0000 mg | ORAL_TABLET | Freq: Once | ORAL | Status: AC
  Administered 2020-03-11: 4 mg via ORAL
  Filled 2020-03-11: qty 1

## 2020-03-11 MED ORDER — ONDANSETRON HCL 4 MG/2ML IJ SOLN: 4.0000 mg | Freq: Four times a day (QID) | INTRAMUSCULAR | Status: DC | PRN

## 2020-03-11 MED ORDER — DIAZEPAM 5 MG PO TABS
ORAL_TABLET | ORAL | Status: AC
  Administered 2020-03-11: 10 mg via ORAL
  Filled 2020-03-11: qty 2

## 2020-03-11 MED ORDER — HYDROCODONE-ACETAMINOPHEN 5-325 MG PO TABS
ORAL_TABLET | ORAL | Status: AC
  Filled 2020-03-11: qty 1

## 2020-03-11 MED ORDER — DIAZEPAM 5 MG PO TABS: 10.0000 mg | ORAL_TABLET | Freq: Once | ORAL | Status: AC

## 2020-03-11 MED ORDER — HYDROCODONE-ACETAMINOPHEN 5-325 MG PO TABS
1.0000 | ORAL_TABLET | ORAL | Status: DC | PRN
  Administered 2020-03-11: 1 via ORAL

## 2020-03-11 MED ORDER — IOHEXOL 300 MG/ML  SOLN
10.0000 mL | Freq: Once | INTRAMUSCULAR | Status: AC | PRN
  Administered 2020-03-11: 7 mL via INTRATHECAL

## 2020-03-11 MED ORDER — DEXAMETHASONE 4 MG PO TABS
ORAL_TABLET | ORAL | Status: AC
  Filled 2020-03-11: qty 1

## 2020-03-11 MED ORDER — LIDOCAINE HCL (PF) 1 % IJ SOLN
5.0000 mL | Freq: Once | INTRAMUSCULAR | Status: AC
  Administered 2020-03-11: 5 mL via INTRADERMAL

## 2020-03-11 NOTE — Procedures (Signed)
Russell Padilla is a 64 year old individual whose had extensive spondylosis in the lumbar and cervical spines he has had previous decompression and fusion from L3-L5 and has had right lumbar radicular pain with weakness in the right leg he also has left shoulder and arm pain and has had previous cervical fusions over the last 25 years.  After careful consideration of his options I advised myelography to be performed to look at the entire neural axis as he has adjacent level disease on plain x-ray at L1-2 and L2-3 he also has significant cervical spondylitic stenosis at multiple levels had been operated on.  A total myelogram is now being performed.  Pre op Dx: Cervical spondylosis with myelopathy, lumbar stenosis, lumbar radiculopathy history of cervical and lumbar fusions Post op Dx: Same Procedure: Total myelogram Surgeon: Ellene Route Puncture level: L2-3 Fluid color: Clear colorless Injection: Iohexol 300, 7 mL Findings: Severe spondylitic stenosis above the fusion at L1-2 and L2-3 cervical spondylitic disease to be evaluated more fully with cervical CT scan.

## 2020-03-11 NOTE — Discharge Instructions (Signed)
Myelogram and Lumbar Puncture Discharge Instructions  1. Go home and rest quietly for the next 24 hours.  It is important to lie flat for the next 24 hours.  Get up only to go to the restroom.  You may lie in the bed or on a couch on your back, your stomach, your left side or your right side.  You may have one pillow under your head.  You may have pillows between your knees while you are on your side or under your knees while you are on your back.  2. DO NOT drive today.  Recline the seat as far back as it will go, while still wearing your seat belt, on the way home.  3. You may get up to go to the bathroom as needed.  You may sit up for 10 minutes to eat.  You may resume your normal diet and medications unless otherwise indicated.  4. The incidence of headache, nausea, or vomiting is about 5% (one in 20 patients).  If you develop a headache, lie flat and drink plenty of fluids until the headache goes away.  Caffeinated beverages may be helpful.  If you develop severe nausea and vomiting or a headache that does not go away with flat bed rest, call Dr Elsner's office.  5. You may resume normal activities after your 24 hours of bed rest is over; however, do not exert yourself strongly or do any heavy lifting tomorrow.  6. Call your physician for a follow-up appointment.  The results of your myelogram will be sent directly to your physician by the following day.  7. If you have any questions or if complications develop after you arrive home, please call Dr Elsner's office.  Discharge instructions have been explained to the patient.  The patient, or the person responsible for the patient, fully understands these instructions.   

## 2020-07-23 ENCOUNTER — Other Ambulatory Visit: Payer: Self-pay | Admitting: Orthopedic Surgery

## 2020-07-23 DIAGNOSIS — M25512 Pain in left shoulder: Secondary | ICD-10-CM

## 2020-07-27 ENCOUNTER — Other Ambulatory Visit: Payer: Self-pay | Admitting: Orthopedic Surgery

## 2020-07-27 DIAGNOSIS — M25512 Pain in left shoulder: Secondary | ICD-10-CM

## 2020-07-27 DIAGNOSIS — M795 Residual foreign body in soft tissue: Secondary | ICD-10-CM

## 2020-08-05 ENCOUNTER — Inpatient Hospital Stay: Admission: RE | Admit: 2020-08-05 | Payer: Medicare Other | Source: Ambulatory Visit

## 2020-08-05 ENCOUNTER — Other Ambulatory Visit: Payer: Medicare Other

## 2020-08-20 ENCOUNTER — Ambulatory Visit
Admission: RE | Admit: 2020-08-20 | Discharge: 2020-08-20 | Disposition: A | Discharge status: Home / Self Care | Payer: Medicare Other | Source: Ambulatory Visit | Attending: Orthopedic Surgery | Admitting: Orthopedic Surgery

## 2020-08-20 DIAGNOSIS — M25512 Pain in left shoulder: Secondary | ICD-10-CM

## 2020-08-20 MED ORDER — IOPAMIDOL (ISOVUE-M 200) INJECTION 41%
13.0000 mL | Freq: Once | INTRAMUSCULAR | Status: AC
  Administered 2020-08-20: 13 mL via INTRA_ARTICULAR

## 2020-08-23 ENCOUNTER — Other Ambulatory Visit: Payer: Self-pay | Admitting: Physician Assistant

## 2020-08-23 DIAGNOSIS — IMO0002 Reserved for concepts with insufficient information to code with codable children: Secondary | ICD-10-CM

## 2020-08-23 DIAGNOSIS — M75102 Unspecified rotator cuff tear or rupture of left shoulder, not specified as traumatic: Secondary | ICD-10-CM

## 2020-08-24 ENCOUNTER — Other Ambulatory Visit: Payer: Self-pay | Admitting: Physician Assistant

## 2020-08-24 DIAGNOSIS — M778 Other enthesopathies, not elsewhere classified: Secondary | ICD-10-CM

## 2020-08-24 DIAGNOSIS — M7542 Impingement syndrome of left shoulder: Secondary | ICD-10-CM

## 2020-12-24 ENCOUNTER — Other Ambulatory Visit: Payer: Self-pay

## 2020-12-24 ENCOUNTER — Ambulatory Visit (HOSPITAL_COMMUNITY)
Admission: EM | Admit: 2020-12-24 | Discharge: 2020-12-24 | Disposition: A | Discharge status: Home / Self Care | Payer: Medicare Other | Attending: Urgent Care | Admitting: Urgent Care

## 2020-12-24 ENCOUNTER — Encounter (HOSPITAL_COMMUNITY): Payer: Self-pay | Admitting: Emergency Medicine

## 2020-12-24 DIAGNOSIS — R519 Headache, unspecified: Secondary | ICD-10-CM | POA: Diagnosis not present

## 2020-12-24 DIAGNOSIS — B9789 Other viral agents as the cause of diseases classified elsewhere: Secondary | ICD-10-CM

## 2020-12-24 DIAGNOSIS — Z79899 Other long term (current) drug therapy: Secondary | ICD-10-CM | POA: Insufficient documentation

## 2020-12-24 DIAGNOSIS — Z8249 Family history of ischemic heart disease and other diseases of the circulatory system: Secondary | ICD-10-CM | POA: Insufficient documentation

## 2020-12-24 DIAGNOSIS — Z20822 Contact with and (suspected) exposure to covid-19: Secondary | ICD-10-CM | POA: Insufficient documentation

## 2020-12-24 DIAGNOSIS — Z87891 Personal history of nicotine dependence: Secondary | ICD-10-CM | POA: Insufficient documentation

## 2020-12-24 DIAGNOSIS — Z8679 Personal history of other diseases of the circulatory system: Secondary | ICD-10-CM

## 2020-12-24 DIAGNOSIS — J988 Other specified respiratory disorders: Secondary | ICD-10-CM

## 2020-12-24 DIAGNOSIS — I2510 Atherosclerotic heart disease of native coronary artery without angina pectoris: Secondary | ICD-10-CM | POA: Diagnosis not present

## 2020-12-24 DIAGNOSIS — Z955 Presence of coronary angioplasty implant and graft: Secondary | ICD-10-CM | POA: Insufficient documentation

## 2020-12-24 DIAGNOSIS — R6884 Jaw pain: Secondary | ICD-10-CM | POA: Diagnosis not present

## 2020-12-24 DIAGNOSIS — Z7982 Long term (current) use of aspirin: Secondary | ICD-10-CM | POA: Insufficient documentation

## 2020-12-24 DIAGNOSIS — M5412 Radiculopathy, cervical region: Secondary | ICD-10-CM | POA: Diagnosis not present

## 2020-12-24 DIAGNOSIS — J329 Chronic sinusitis, unspecified: Secondary | ICD-10-CM | POA: Diagnosis not present

## 2020-12-24 MED ORDER — PREDNISONE 20 MG PO TABS
ORAL_TABLET | ORAL | 0 refills | Status: DC
Start: 2020-12-24 — End: 2022-09-23

## 2020-12-24 NOTE — ED Triage Notes (Signed)
Pt presents with headache, fever, cough and congestion xs 2 days.

## 2020-12-24 NOTE — ED Provider Notes (Signed)
[place]   MRN: [id number] DOB: December 26, 1955  Subjective:   Russell Padilla is a 65 y.o. male presenting for 2-day history of acute onset intermittent headaches of the mid to left side, fever (last temp check at home was 100.5 degrees Fahrenheit), congestion, body aches, general malaise.  Patient has a history of chronic rhinosinusitis.  He is not taking medications for this on a regular basis.  He did use some Excedrin and eventually his headache resolved on its own.  He also used some leftover tramadol from previous prescription that he had leftover from the surgery.  Has been having persistent and constant left-sided neck pain that radiates into his shoulder and down his arm.  Has a history of cervical radiculopathy, sees a specialist for this.  He is also been seen a specialist for rotator cuff syndrome/tendinitis.  He has a follow-up coming up soon in which they will discuss options beyond injections including surgery.  He has a history of heart disease, has had a stent placed.  He does admit that yesterday his jaw was hurting as well.  Has not had follow-up with his cardiologist for this year.  Has yearly follow-ups.  He is a former smoker, quit 6 years ago.  Has also had redness about the white part of his eye.  Denies any particular trauma, vision change, eyelid swelling or eyelid pain, eye pain.  He did use some Clear Eyes on it.  He does have his COVID vaccine with booster.  No current facility-administered medications for this encounter.  Current Outpatient Medications:  .  acetaminophen (TYLENOL) 500 MG tablet, Take 1,000 mg by mouth every 8 (eight) hours as needed for moderate pain., Disp: , Rfl:  .  aspirin 81 MG EC tablet, Take 81 mg by mouth daily. Swallow whole., Disp: , Rfl:  .  cholecalciferol (VITAMIN D3) 25 MCG (1000 UNIT) tablet, Take 1,000 Units by mouth daily., Disp: , Rfl:  .  dicyclomine (BENTYL) 20 MG tablet, Take 20 mg by mouth 4 (four) times daily as  needed (IBS). , Disp: , Rfl:  .  docusate sodium (COLACE) 100 MG capsule, Take 100 mg by mouth daily. , Disp: , Rfl:  .  fluticasone (FLONASE) 50 MCG/ACT nasal spray, Place 1 spray into both nostrils at bedtime. , Disp: , Rfl:  .  gabapentin (NEURONTIN) 300 MG capsule, Take 1 tablet PO in the morning and take 2 tablets PO at bedtime. (Patient taking differently: Take 300 mg by mouth 2 (two) times daily. ), Disp: 270 capsule, Rfl: 3 .  methocarbamol (ROBAXIN) 500 MG tablet, Take 500 mg by mouth every 8 (eight) hours as needed for muscle spasms. , Disp: , Rfl:  .  Multiple Vitamins-Minerals (MULTIVITAMIN WITH MINERALS) tablet, Take 1 tablet by mouth daily., Disp: , Rfl:  .  nitroGLYCERIN (NITROSTAT) 0.4 MG SL tablet, Place 1 tablet (0.4 mg total) under the tongue every 5 (five) minutes as needed for chest pain., Disp: 25 tablet, Rfl: 5 .  olmesartan (BENICAR) 40 MG tablet, Take 40 mg by mouth daily., Disp: , Rfl:  .  oxyCODONE-acetaminophen (PERCOCET/ROXICET) 5-325 MG per tablet, Take 1 tablet by mouth every 4 (four) hours as needed for severe pain. , Disp: , Rfl:  .  pantoprazole (PROTONIX) 40 MG tablet, Take 40 mg by mouth 2 (two) times daily. , Disp: , Rfl:  .  senna (SENOKOT) 8.6 MG TABS, Take 1-2 tablets by mouth daily. , Disp: , Rfl:  .  sildenafil (VIAGRA) 100 MG tablet, Take 100 mg by mouth daily as needed for erectile dysfunction., Disp: , Rfl:  .  simvastatin (ZOCOR) 20 MG tablet, Take 1 tablet (20 mg total) by mouth at bedtime. (Patient taking differently: Take 40 mg by mouth at bedtime.), Disp: 90 tablet, Rfl: 3 .  sucralfate (CARAFATE) 1 GM/10ML suspension, Take 1 g by mouth 3 (three) times daily as needed (spasms). With Lidocaine 2%  and Mylanta, Disp: , Rfl:  .  traMADol (ULTRAM) 50 MG tablet, Take 50 mg by mouth every 6 (six) hours as needed for moderate pain., Disp: , Rfl:    Allergies  Allergen Reactions  . Crestor [Rosuvastatin Calcium] Other (See Comments)    Aches   . Latex  Hives and Rash    When at dentist    Past Medical History:  Diagnosis Date  . Abdominal pain 07-11-13   ongoing and unspecified  . Arthritis   . Bladder stone 07-11-13   surgery planned  . CAD (coronary artery disease)    a. 12/2012 Cath/PCI: DES to superior branch of OM2;  b. Repeat cath 4/14 with 20% LAD, patent stent OM3, 70% diffuse stenosis small, non-dominant RCA.  . Essential hypertension   . Headache   . HLD (hyperlipidemia)   . Renal disorder    renal calculi  . Stomach pain    chronic     Past Surgical History:  Procedure Laterality Date  . BACK SURGERY     2 cervical/ 2 lumbar fusions  . CARDIAC CATHETERIZATION N/A 03/04/2015   Procedure: Left Heart Cath and Coronary Angiography;  Surgeon: Burnell Blanks, MD;  Location: [place] [facility];  Service: Cardiovascular;  Laterality: N/A;  . CHOLECYSTECTOMY     laparoscopic.  Marland Kitchen CORONARY STENT PLACEMENT     3'14  . ESOPHAGOGASTRODUODENOSCOPY ENDOSCOPY     multiple times-[facility]/ and now being followed by Endo- [facility]  . KNEE ARTHROSCOPY Left    '80's  . LEFT HEART CATHETERIZATION WITH CORONARY ANGIOGRAM N/A 12/05/2012   Procedure: LEFT HEART CATHETERIZATION WITH CORONARY ANGIOGRAM;  Surgeon: Birdie Riddle, MD;  Location: [place] C[facility];  Service: Cardiovascular;  Laterality: N/A;  . LEFT HEART CATHETERIZATION WITH CORONARY ANGIOGRAM N/A 01/17/2013   Procedure: LEFT HEART CATHETERIZATION WITH CORONARY ANGIOGRAM;  Surgeon: Wellington Hampshire, MD;  Location: [place] [facility];  Service: Cardiovascular;  Laterality: N/A;  . PERCUTANEOUS CORONARY STENT INTERVENTION (PCI-S) N/A 12/06/2012   Procedure: PERCUTANEOUS CORONARY STENT INTERVENTION (PCI-S);  Surgeon: Burnell Blanks, MD;  Location: [facility] C[facility] LAB;  Service: Cardiovascular;  Laterality: N/A;    Family History  Problem Relation Age of Onset  . Stomach cancer Mother   . Congestive Heart Failure Father   . Dementia Father   . Heart disease Brother     Social  History   Tobacco Use  . Smoking status: Former Smoker    Packs/day: 0.50    Years: 30.00    Pack years: 15.00    Types: Cigarettes    Quit date: 12/04/2012    Years since quitting: 8.0  . Smokeless tobacco: Never Used  Vaping Use  . Vaping Use: Never used  Substance Use Topics  . Alcohol use: No    Alcohol/week: 0.0 standard drinks  . Drug use: No    ROS   Objective:   Vitals: BP 105/66 (BP Location: Left Arm)   Pulse 64   Temp 98.4 F (36.9 C) (Oral)   Resp 18   SpO2  95%   Physical Exam Constitutional:      General: He is not in acute distress.    Appearance: Normal appearance. He is well-developed and normal weight. He is not ill-appearing, toxic-appearing or diaphoretic.  HENT:     Head: Normocephalic and atraumatic.     Right Ear: Tympanic membrane, ear canal and external ear normal. There is no impacted cerumen.     Left Ear: Tympanic membrane, ear canal and external ear normal. There is no impacted cerumen.     Nose: Nose normal. No congestion or rhinorrhea.     Mouth/Throat:     Mouth: Mucous membranes are moist.     Pharynx: Oropharynx is clear. No oropharyngeal exudate or posterior oropharyngeal erythema.  Eyes:     General: No scleral icterus.       Right eye: No discharge.        Left eye: No discharge.     Extraocular Movements: Extraocular movements intact.     Conjunctiva/sclera: Conjunctivae normal.     Pupils: Pupils are equal, round, and reactive to light.   Cardiovascular:     Rate and Rhythm: Normal rate and regular rhythm.     Heart sounds: Normal heart sounds. No murmur heard. No friction rub. No gallop.   Pulmonary:     Effort: Pulmonary effort is normal. No respiratory distress.     Breath sounds: Normal breath sounds. No stridor. No wheezing, rhonchi or rales.  Musculoskeletal:     Cervical back: Normal range of motion and neck supple. No rigidity. No muscular tenderness.     Comments: Limited mobility for the left shoulder.   Skin:    General: Skin is warm and dry.  Neurological:     General: No focal deficit present.     Mental Status: He is alert and oriented to person, place, and time.     Motor: No weakness.     Coordination: Romberg sign negative. Coordination normal.     Gait: Gait normal.  Psychiatric:        Mood and Affect: Mood normal.        Speech: Speech normal.        Behavior: Behavior normal.        Thought Content: Thought content normal.        Judgment: Judgment normal.     ED ECG REPORT   Date: 12/24/2020  Rate: 63bpm  Rhythm: normal sinus rhythm  QRS Axis: left  Intervals: normal  ST/T Wave abnormalities: nonspecific T wave changes  Conduction Disutrbances:none  Narrative Interpretation: Sinus rhythm at 63 bpm with left axis deviation.  Nonspecific T wave flattening but very comparable to previous EKG from last year.  Old EKG Reviewed: unchanged  I have personally reviewed the EKG tracing and agree with the computerized printout as noted.   Assessment and Plan :   PDMP not reviewed this encounter.  1. Viral respiratory illness   2. Cervical radiculopathy   3. Left-sided headache   4. Jaw pain   5. History of heart disease     COVID-19 test is pending.  Recommend managing with supportive care.  Schedule Tylenol to help with fevers, aches and pains.  Given his cervical radiculopathy will also use prednisone as he has had success with this in the past.  Maintain follow-up with his regular doctors, specialist. Counseled patient on potential for adverse effects with medications prescribed/recommended today, ER and return-to-clinic precautions discussed, patient verbalized understanding.    Jaynee Eagles, [place] 12/24/20 [id number]

## 2020-12-24 NOTE — Discharge Instructions (Addendum)
We will notify you of your COVID-19 test results as they arrive and may take between 24 to 48 hours.  I encourage you to sign up for MyChart if you have not already done so as this can be the easiest way for [place] to communicate results to you online or through a phone app.  In the meantime, if you develop worsening symptoms including fever, chest pain, shortness of breath despite our current treatment plan then please report to the emergency room as this may be a sign of worsening status from possible COVID-19 infection.  Otherwise, we will manage this as a viral syndrome. For sore throat or cough try using a honey-based tea. Use 3 teaspoons of honey with juice squeezed from half lemon. Place shaved pieces of ginger into 1/2-1 cup of water and warm over stove top. Then mix the ingredients and repeat every 4 hours as needed. Please take Tylenol 500mg -650mg  every 6 hours for aches and pains, fevers. Hydrate very well with at least 2 liters of water. Eat light meals such as soups to replenish electrolytes and soft fruits, veggies. Start an antihistamine like Zyrtec, Allegra or Claritin for postnasal drainage, sinus congestion.    If you develop persistent headache, chest pain, weakness, vision changes, then please report to the emergency room to make sure you are not having a recurrent heart event, stroke. Otherwise, make sure you follow up with your regular doctor as soon as possible.

## 2020-12-25 LAB — SARS CORONAVIRUS 2 (TAT 6-24 HRS): SARS Coronavirus 2: NEGATIVE

## 2021-07-06 NOTE — Progress Notes (Deleted)
Cardiology Office Note    Date:  07/06/2021   ID:  Russell Padilla 10/31/1955, MRN [id number]   Russell Padilla:  Russell Padilla, [place]  Cardiologist:  Russell Padilla *** Advanced Practice Provider:  No care team member to display Electrophysiologist:  None   [phone]   No chief complaint on file.   History of Present Illness:  Russell Padilla is a 65 y.o. male with history of CAD cardiac cath 12/2012 DES to OM2 otherwise nonobstructive disease, intermediate NST 2016 repeat cath 02/2015 stable CAD mild LAD disease CTO of OM1 with left to left collaterals.  Also has hypertension, HLD, chronic neck and shoulder pain.  Patient last saw Russell Padilla 01/2020 and was having some GI issues but otherwise stable.    Past Medical History:  Diagnosis Date   Abdominal pain 07-11-13   ongoing and unspecified   Arthritis    Bladder stone 07-11-13   surgery planned   CAD (coronary artery disease)    a. 12/2012 Cath/PCI: DES to superior branch of OM2;  b. Repeat cath 4/14 with 20% LAD, patent stent OM3, 70% diffuse stenosis small, non-dominant RCA.   Essential hypertension    Headache    HLD (hyperlipidemia)    Renal disorder    renal calculi   Stomach pain    chronic    Past Surgical History:  Procedure Laterality Date   BACK SURGERY     2 cervical/ 2 lumbar fusions   CARDIAC CATHETERIZATION N/A 03/04/2015   Procedure: Left Heart Cath and Coronary Angiography;  Surgeon: Russell Padilla;  Location: [facility];  Service: Cardiovascular;  Laterality: N/A;   CHOLECYSTECTOMY     laparoscopic.   CORONARY STENT PLACEMENT     3'14   ESOPHAGOGASTRODUODENOSCOPY ENDOSCOPY     multiple times-Russell Padilla/ and now being followed by Endo- Russell Padilla   KNEE ARTHROSCOPY Left    '80's   LEFT HEART CATHETERIZATION WITH CORONARY ANGIOGRAM N/A 12/05/2012   Procedure: LEFT HEART CATHETERIZATION WITH CORONARY ANGIOGRAM;  Surgeon: Russell Riddle, Russell;   Location: [facility];  Service: Cardiovascular;  Laterality: N/A;   LEFT HEART CATHETERIZATION WITH CORONARY ANGIOGRAM N/A 01/17/2013   Procedure: LEFT HEART CATHETERIZATION WITH CORONARY ANGIOGRAM;  Surgeon: Russell Hampshire, Russell;  Location: [facility];  Service: Cardiovascular;  Laterality: N/A;   PERCUTANEOUS CORONARY STENT INTERVENTION (PCI-S) N/A 12/06/2012   Procedure: PERCUTANEOUS CORONARY STENT INTERVENTION (PCI-S);  Surgeon: Russell Padilla;  Location[facility];  Service: Cardiovascular;  Laterality: N/A;    Current Medications: No outpatient medications have been marked as taking for the 07/20/21 encounter (Appointment) with Russell Burn, PA-C.     Allergies:   Russell Padilla [rosuvastatin calcium] and Russell Padilla   Social History   Socioeconomic History   Marital status: Married    Spouse name: Not on file   Number of children: 0   Years of education: 14   Highest education level: Not on file  Occupational History   Occupation: Retired  Tobacco Use   Smoking status: Former    Packs/day: 0.50    Years: 30.00    Pack years: 15.00    Types: Cigarettes    Quit date: 12/04/2012    Years since quitting: 8.5   Smokeless tobacco: Never  Vaping Use   Vaping Use: Never used  Substance and Sexual Activity   Alcohol use: No    Alcohol/week: 0.0 standard drinks  Drug use: No   Sexual activity: Not on file  Other Topics Concern   Not on file  Social History Narrative   Lives at home with his wife, Russell Padilla.   Right-handed.   4-5 cans diet colas and occasional glasses of tea per day.   Retired 2007.   Social Determinants of Health   Financial Resource Strain: Not on file  Food Insecurity: Not on file  Transportation Needs: Not on file  Physical Activity: Not on file  Stress: Not on file  Social Connections: Not on file     Family History:  The patient's ***family history includes Congestive Heart Failure in his father; Dementia in his father; Heart disease in his  brother; Stomach cancer in his mother.   ROS:   Please see the history of present illness.    ROS All other systems reviewed and are negative.   PHYSICAL EXAM:   VS:  There were no vitals taken for this visit.  Physical Exam  GEN: Well nourished, well developed, in no acute distress  HEENT: normal  Neck: no JVD, carotid bruits, or masses Cardiac:RRR; no murmurs, rubs, or gallops  Respiratory:  clear to auscultation bilaterally, normal work of breathing GI: soft, nontender, nondistended, + BS Ext: without cyanosis, clubbing, or edema, Good distal pulses bilaterally MS: no deformity or atrophy  Skin: warm and dry, no rash Neuro:  Alert and Oriented x 3, Strength and sensation are intact Psych: euthymic mood, full affect  Wt Readings from Last 3 Encounters:  03/11/20 222 lb (100.7 kg)  01/15/20 225 lb (102.1 kg)  06/29/17 226 lb (102.5 kg)      Studies/Labs Reviewed:   EKG:  EKG is*** ordered today.  The ekg ordered today demonstrates ***  Recent Labs: No results found for requested labs within last 8760 hours.   Lipid Panel    Component Value Date/Time   CHOL 147 11/16/2017 1635   TRIG 101 11/16/2017 1635   HDL 50 11/16/2017 1635   LDLCALC 77 11/16/2017 1635    Additional studies/ records that were reviewed today include:  Carotid Dopplers 02/04/2020 Summary:  Right Carotid: Velocities in the right ICA are consistent with a 1-39%  stenosis.   Left Carotid: Velocities in the left ICA are consistent with a 1-39%  stenosis.   Vertebrals: Bilateral vertebral arteries demonstrate antegrade flow.  Subclavians: Normal flow hemodynamics were seen in bilateral subclavian               arteries.   *See table(s) above for measurements and observations.    Cardiac cath 2016 Prox RCA lesion, 50% stenosed. Mid RCA lesion, 50% stenosed. Dist RCA lesion, 50% stenosed. Prox Cx lesion, 20% stenosed. Ost Ramus to Ramus lesion, 100% stenosed. 2nd Mrg-2 lesion, 70%  stenosed. 1st Diag lesion, 30% stenosed. Prox LAD lesion, 20% stenosed. Prox LAD to Dist LAD lesion, 20% stenosed.   1. Stable double vessel CAD with patent obtuse marginal stent.  2. Moderate disease in the small non-dominant RCA, unchanged from last cath. 3. Mild disease in the LAD and Diagonal 4. Severe stenosis very small caliber sub-branch of OM2, jailed by stent and unchanged.  5. 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  6. Preserved LV systolic function   Recommendations: Will continue medical therapy.     Risk Assessment/Calculations:   {Does this patient have ATRIAL FIBRILLATION?:[phone]}     ASSESSMENT:    No diagnosis found.   PLAN:  In order of problems  listed above:  CAD cardiac cath 12/2012 DES to OM2 otherwise nonobstructive disease, intermediate NST 2016 repeat cath 02/2015 stable CAD mild LAD disease CTO of OM1 with left to left collaterals.  Hypertension  HLD  Mild carotid disease 1 to 39% 02/04/2020  Shared Decision Making/Informed Consent   {Are you ordering a CV Procedure (e.g. stress test, cath, DCCV, TEE, etc)?   Press F2        :[id number]}    Medication Adjustments/Labs and Tests Ordered: Current medicines are reviewed at length with the patient today.  Concerns regarding medicines are outlined above.  Medication changes, Labs and Tests ordered today are listed in the Patient Instructions below. There are no Patient Instructions on file for this visit.   Sumner Boast, PA-C  07/06/2021 3:44 PM    [facility] [place], [place], [place]  [address] Phone: [phone]; Fax: [phone]

## 2021-07-19 ENCOUNTER — Other Ambulatory Visit: Payer: Self-pay | Admitting: Orthopedic Surgery

## 2021-07-19 DIAGNOSIS — M25512 Pain in left shoulder: Secondary | ICD-10-CM

## 2021-07-20 ENCOUNTER — Ambulatory Visit: Payer: Medicare Other | Admitting: Physician Assistant

## 2021-07-20 DIAGNOSIS — I2510 Atherosclerotic heart disease of native coronary artery without angina pectoris: Secondary | ICD-10-CM

## 2021-07-20 DIAGNOSIS — I6523 Occlusion and stenosis of bilateral carotid arteries: Secondary | ICD-10-CM

## 2021-07-20 DIAGNOSIS — E7849 Other hyperlipidemia: Secondary | ICD-10-CM

## 2021-07-20 DIAGNOSIS — I10 Essential (primary) hypertension: Secondary | ICD-10-CM

## 2021-08-11 ENCOUNTER — Other Ambulatory Visit (HOSPITAL_COMMUNITY): Payer: Self-pay | Admitting: Physician Assistant

## 2021-08-11 ENCOUNTER — Other Ambulatory Visit: Payer: Self-pay

## 2021-08-11 ENCOUNTER — Ambulatory Visit (HOSPITAL_COMMUNITY)
Admission: RE | Admit: 2021-08-11 | Discharge: 2021-08-11 | Disposition: A | Discharge status: Home / Self Care | Payer: Medicare Other | Source: Ambulatory Visit | Attending: Physician Assistant | Admitting: Physician Assistant

## 2021-08-11 DIAGNOSIS — M79672 Pain in left foot: Secondary | ICD-10-CM

## 2021-08-16 NOTE — Progress Notes (Signed)
Cardiology Office Note    Date:  08/30/2021   ID:  Russell Padilla, DOB [birth year], MRN [id number]   PCP:  Sharilyn Sites, [place]  Cardiologist:  Lauree Chandler, MD   Advanced Practice Provider:  No care team member to display Electrophysiologist:  None   [phone]   Chief Complaint  Patient presents with   Follow-up     History of Present Illness:  Russell Padilla is a 65 y.o. male with history of hypertension, HLD, chronic neck and shoulder pain, CAD, status post DES to the OM 2 12/2012 with residual nonobstructive disease, normal LVEF on echo 2014, repeat cath 01/2013 patent stent for pulmonary and hematology work-up for thrombocytopenia was normal.  Intermediate risk NST 2016 repeat cath stable CAD CTO of OM1 with left-to-right collaterals mild LAD stenosis.  Mild carotid disease.  Patient last saw Dr. Angelena Form 01/15/2020 and was having some esophageal problems.  Carotid Dopplers ordered and had mild 1 to 39% right and left ICAs.  Patient comes in for f/u. Has chest burning in chest if working in yard that he's had for years-picking up limbs, bending over. Worked with a chain saw last week for 6 hrs and had no symptoms.no dyspnea. Occasional ankle swelling.     Past Medical History:  Diagnosis Date   Abdominal pain 07-11-13   ongoing and unspecified   Arthritis    Bladder stone 07-11-13   surgery planned   CAD (coronary artery disease)    a. 12/2012 Cath/PCI: DES to superior branch of OM2;  b. Repeat cath 4/14 with 20% LAD, patent stent OM3, 70% diffuse stenosis small, non-dominant RCA.   Essential hypertension    Headache    HLD (hyperlipidemia)    Renal disorder    renal calculi   Stomach pain    chronic    Past Surgical History:  Procedure Laterality Date   BACK SURGERY     2 cervical/ 2 lumbar fusions   CARDIAC CATHETERIZATION N/A 03/04/2015   Procedure: Left Heart Cath and Coronary Angiography;  Surgeon: Burnell Blanks, MD;  Location: [facility];  Service: Cardiovascular;  Laterality: N/A;   CHOLECYSTECTOMY     laparoscopic.   CORONARY STENT PLACEMENT     3'14   ESOPHAGOGASTRODUODENOSCOPY ENDOSCOPY     multiple times-[facility]/ and now being followed by Endo- [facility]   KNEE ARTHROSCOPY Left    '80's   LEFT HEART CATHETERIZATION WITH CORONARY ANGIOGRAM N/A 12/05/2012   Procedure: LEFT HEART CATHETERIZATION WITH CORONARY ANGIOGRAM;  Surgeon: Birdie Riddle, MD;  Location: [facility];  Service: Cardiovascular;  Laterality: N/A;   LEFT HEART CATHETERIZATION WITH CORONARY ANGIOGRAM N/A 01/17/2013   Procedure: LEFT HEART CATHETERIZATION WITH CORONARY ANGIOGRAM;  Surgeon: Wellington Hampshire, MD;  Location: [facility];  Service: Cardiovascular;  Laterality: N/A;   PERCUTANEOUS CORONARY STENT INTERVENTION (PCI-S) N/A 12/06/2012   Procedure: PERCUTANEOUS CORONARY STENT INTERVENTION (PCI-S);  Surgeon: Burnell Blanks, MD;  Location: [facility] C[facility];  Service: Cardiovascular;  Laterality: N/A;    Current Medications: No outpatient medications have been marked as taking for the 08/30/21 encounter (Office Visit) with Imogene Burn, PA-C.     Allergies:   Crestor [rosuvastatin calcium] and Latex   Social History   Socioeconomic History   Marital status: Married    Spouse name: Not on file   Number of children: 0   Years of education: 14   Highest education  level: Not on file  Occupational History   Occupation: Retired  Tobacco Use   Smoking status: Former    Packs/day: 0.50    Years: 30.00    Pack years: 15.00    Types: Cigarettes    Quit date: 12/04/2012    Years since quitting: 8.7   Smokeless tobacco: Never  Vaping Use   Vaping Use: Never used  Substance and Sexual Activity   Alcohol use: No    Alcohol/week: 0.0 standard drinks   Drug use: No   Sexual activity: Not on file  Other Topics Concern   Not on file  Social History Narrative   Lives at home with his wife, Regino Schultze.    Right-handed.   4-5 cans diet colas and occasional glasses of tea per day.   Retired 2007.   Social Determinants of Health   Financial Resource Strain: Not on file  Food Insecurity: Not on file  Transportation Needs: Not on file  Physical Activity: Not on file  Stress: Not on file  Social Connections: Not on file     Family History:  The patient's  family history includes Congestive Heart Failure in his father; Dementia in his father; Heart disease in his brother; Stomach cancer in his mother.   ROS:   Please see the history of present illness.    ROS All other systems reviewed and are negative.   PHYSICAL EXAM:   VS:  BP 126/70   Pulse (!) 57   Wt 234 lb (106.1 kg)   BMI 33.10 kg/m   Physical Exam  GEN: Well nourished, well developed, in no acute distress  Neck: no JVD, carotid bruits, or masses Cardiac:RRR; 1/6 systolic murmur LSB  Respiratory:  clear to auscultation bilaterally, normal work of breathing GI: soft, nontender, nondistended, + BS Ext: without cyanosis, clubbing, or edema, Good distal pulses bilaterally Neuro:  Alert and Oriented x 3, Psych: euthymic mood, full affect  Wt Readings from Last 3 Encounters:  08/30/21 234 lb (106.1 kg)  08/30/21 234 lb (106.1 kg)  03/11/20 222 lb (100.7 kg)      Studies/Labs Reviewed:   EKG:  EKG is not ordered today.     Recent Labs: No results found for requested labs within last 8760 hours.   Lipid Panel    Component Value Date/Time   CHOL 147 11/16/2017 1635   TRIG 101 11/16/2017 1635   HDL 50 11/16/2017 1635   LDLCALC 77 11/16/2017 1635    Additional studies/ records that were reviewed today include:  Carotid Dopplers 02/2020 Summary:  Right Carotid: Velocities in the right ICA are consistent with a 1-39%  stenosis.   Left Carotid: Velocities in the left ICA are consistent with a 1-39%  stenosis.   Vertebrals: Bilateral vertebral arteries demonstrate antegrade flow.  Subclavians: Normal flow  hemodynamics were seen in bilateral subclavian               arteries.   *See table(s) above for measurements and observations.      Electronically signed by Larae Grooms MD on 02/05/2020 at 12:28:39 PM.      Risk Assessment/Calculations:         ASSESSMENT:    1. Coronary artery disease involving native coronary artery of native heart without angina pectoris   2. Essential hypertension   3. Hyperlipidemia, unspecified hyperlipidemia type   4. Bilateral carotid artery stenosis      PLAN:  In order of problems listed above:  CAD, status post DES to  the OM 2 12/2012 with residual nonobstructive disease, normal LVEF on echo 2014, repeat cath 01/2013 patent stent .  Intermediate risk NST 2016 repeat cath stable CAD CTO of OM1 with left-to-right collaterals mild LAD stenosis. Chronic angina unchanged.   Hypertension-BP controlled  HLD-simvastatin 40 mg-had labs last week. Need to get a copy. Has had trouble with statins in past. LDL 144 last year. If still high will refer to lipid clinic  Mild carotid stenosis on Dopplers 02/2020  Shared Decision Making/Informed Consent        Medication Adjustments/Labs and Tests Ordered: Current medicines are reviewed at length with the patient today.  Concerns regarding medicines are outlined above.  Medication changes, Labs and Tests ordered today are listed in the Patient Instructions below. There are no Patient Instructions on file for this visit.   Signed, Ermalinda Barrios, PA-C  08/30/2021 2:57 PM    [facility] [place], [place], [place]  [address] Phone: [phone]; Fax: [phone]

## 2021-08-30 ENCOUNTER — Ambulatory Visit (INDEPENDENT_AMBULATORY_CARE_PROVIDER_SITE_OTHER): Payer: Medicare Other | Admitting: Physician Assistant

## 2021-08-30 ENCOUNTER — Encounter: Payer: Self-pay | Admitting: *Deleted

## 2021-08-30 ENCOUNTER — Encounter: Payer: Self-pay | Admitting: Physician Assistant

## 2021-08-30 ENCOUNTER — Other Ambulatory Visit: Payer: Self-pay

## 2021-08-30 ENCOUNTER — Encounter: Payer: Medicare Other | Admitting: Physician Assistant

## 2021-08-30 VITALS — BP 126/70 | HR 57 | Wt 234.0 lb

## 2021-08-30 VITALS — BP 126/70 | HR 57 | Ht 70.5 in | Wt 234.0 lb

## 2021-08-30 DIAGNOSIS — I2510 Atherosclerotic heart disease of native coronary artery without angina pectoris: Secondary | ICD-10-CM | POA: Diagnosis not present

## 2021-08-30 DIAGNOSIS — I6523 Occlusion and stenosis of bilateral carotid arteries: Secondary | ICD-10-CM

## 2021-08-30 DIAGNOSIS — E785 Hyperlipidemia, unspecified: Secondary | ICD-10-CM | POA: Diagnosis not present

## 2021-08-30 DIAGNOSIS — I10 Essential (primary) hypertension: Secondary | ICD-10-CM | POA: Diagnosis not present

## 2021-08-30 NOTE — Patient Instructions (Signed)
Medication Instructions:  Your physician recommends that you continue on your current medications as directed. Please refer to the Current Medication list given to you today.  *If you need a refill on your cardiac medications before your next appointment, please call your pharmacy*   Lab Work: NONE   If you have labs (blood work) drawn today and your tests are completely normal, you will receive your results only by: [place] (if you have MyChart) OR A paper copy in the mail If you have any lab test that is abnormal or we need to change your treatment, we will call you to review the results.   Testing/Procedures: NONE   Follow-Up: At [facility], you and your health needs are our priority.  As part of our continuing mission to provide you with exceptional heart care, we have created designated Provider Care Teams.  These Care Teams include your primary Cardiologist (physician) and Advanced Practice Providers (APPs -  Physician Assistants and Nurse Practitioners) who all work together to provide you with the care you need, when you need it.  We recommend signing up for the patient portal called "MyChart".  Sign up information is provided on this After Visit Summary.  MyChart is used to connect with patients for Virtual Visits (Telemedicine).  Patients are able to view lab/test results, encounter notes, upcoming appointments, etc.  Non-urgent messages can be sent to your provider as well.   To learn more about what you can do with MyChart, go to [email or web address].    Your next appointment:   1 year(s)  The format for your next appointment:   In Person  Provider:   Dr.McAlhany      Other Instructions Thank you for choosing [facility]!

## 2021-08-31 ENCOUNTER — Encounter: Payer: Self-pay | Admitting: Physician Assistant

## 2021-09-07 NOTE — Progress Notes (Signed)
erroneous

## 2021-09-16 ENCOUNTER — Telehealth: Payer: Self-pay

## 2021-09-16 DIAGNOSIS — E7800 Pure hypercholesterolemia, unspecified: Secondary | ICD-10-CM

## 2021-09-16 NOTE — Telephone Encounter (Signed)
-----   Message from [place], [place] sent at 09/15/2021  3:28 PM EST ----- Labs requested by Selinda Eon but resulted to me - His LDL has improved from last year but still remains elevated at 124 and goal is less than 70 given his known CAD. Given his intolerance to high intensity statins in the past, would recommend referral to the [facility] as documented by [facility] during his recent visit.

## 2021-09-16 NOTE — Telephone Encounter (Signed)
Patient notified and verbalized understanding. Lipid Clinic referral entered. PCP copied.

## 2021-11-08 NOTE — Progress Notes (Signed)
Patient ID: [name]                 DOB: 04-23-1956                    MRN: [id number]     HPI: Russell Padilla is a 66 y.o. male patient of Dr. Angelena Form referred to lipid clinic by Russell Husk, PA-C. PMH is significant for HTN, HLD, chronic neck and shoulder pain, chest pain, CAD s/p DES to the OM2 12/2012 with residual nonobstructive disease, normal LVEF on echo 2014, repeat cath 01/2013 patent stent. Intermediate risk NST 2016 repeat cath stable CAD CTO of OM1 with left-to-right collaterals mild LAD stenosis. 02/2020 US revealed mild carotid disease.   Patient arrives in good spirits accompanied by his wife. Reports intolerances to atorvastatin and rosuvastatin. Reports not eating a very healthy diet.   Current Medications: simvastatin 40 mg daily Intolerances: atorvastatin 40 mg (muscle aches), rosuvastatin 20 mg (muscle aches) Risk Factors: CAD s/p DES, HTN, HLD, carotid disease LDL goal: <55 mg/dL  Diet: reports he does not eat very healthy  Exercise: does yard/house work, limited by taking care of his wife who has Parkinson's - he does not leave her alone  Family History: The patient's  family history includes Congestive Heart Failure in his father; Dementia in his father; Heart disease in his brother; Stomach cancer in his mother.   Social History: Former smoker  Labs: 08/19/21: TC 204, TG 158, HDL 52, LDL 124 (simvastatin 40 mg)  Past Medical History:  Diagnosis Date   Abdominal pain 07-11-13   ongoing and unspecified   Arthritis    Bladder stone 07-11-13   surgery planned   CAD (coronary artery disease)    a. 12/2012 Cath/PCI: DES to superior branch of OM2;  b. Repeat cath 4/14 with 20% LAD, patent stent OM3, 70% diffuse stenosis small, non-dominant RCA.   Essential hypertension    Headache    HLD (hyperlipidemia)    Renal disorder    renal calculi   Stomach pain    chronic    Current Outpatient Medications on File Prior to Visit  Medication Sig Dispense Refill    acetaminophen (TYLENOL) 500 MG tablet Take 1,000 mg by mouth every 8 (eight) hours as needed for moderate pain.     aspirin 81 MG EC tablet Take 81 mg by mouth daily. Swallow whole.     cholecalciferol (VITAMIN D3) 25 MCG (1000 UNIT) tablet Take 1,000 Units by mouth daily.     dicyclomine (BENTYL) 20 MG tablet Take 20 mg by mouth 4 (four) times daily as needed (IBS).      docusate sodium (COLACE) 100 MG capsule Take 100 mg by mouth daily.      fluticasone (FLONASE) 50 MCG/ACT nasal spray Place 1 spray into both nostrils at bedtime.      gabapentin (NEURONTIN) 300 MG capsule Take 1 tablet PO in the morning and take 2 tablets PO at bedtime. (Patient taking differently: Take 300 mg by mouth 2 (two) times daily. 600 mg in the morning and 600mg  in the afternoon) 270 capsule 3   methocarbamol (ROBAXIN) 500 MG tablet Take 500 mg by mouth every 8 (eight) hours as needed for muscle spasms.      Multiple Vitamins-Minerals (MULTIVITAMIN WITH MINERALS) tablet Take 1 tablet by mouth daily.     nitroGLYCERIN (NITROSTAT) 0.4 MG SL tablet Place 1 tablet (0.4 mg total) under the tongue every 5 (five) minutes as needed  for chest pain. 25 tablet 5   olmesartan (BENICAR) 40 MG tablet Take 40 mg by mouth daily.     oxyCODONE-acetaminophen (PERCOCET/ROXICET) 5-325 MG per tablet Take 1 tablet by mouth every 4 (four) hours as needed for severe pain.      pantoprazole (PROTONIX) 40 MG tablet Take 40 mg by mouth 2 (two) times daily.      predniSONE (DELTASONE) 20 MG tablet Take 2 tablets daily with breakfast. (Patient not taking: Reported on 08/30/2021) 10 tablet 0   senna (SENOKOT) 8.6 MG TABS Take 1-2 tablets by mouth daily.      sildenafil (VIAGRA) 100 MG tablet Take 100 mg by mouth daily as needed for erectile dysfunction.     simvastatin (ZOCOR) 20 MG tablet Take 1 tablet (20 mg total) by mouth at bedtime. (Patient taking differently: Take 40 mg by mouth at bedtime.) 90 tablet 3   sucralfate (CARAFATE) 1 GM/10ML  suspension Take 1 g by mouth 3 (three) times daily as needed (spasms). With Lidocaine 2%  and Mylanta     traMADol (ULTRAM) 50 MG tablet Take 50 mg by mouth every 6 (six) hours as needed for moderate pain.     No current facility-administered medications on file prior to visit.    Allergies  Allergen Reactions   Crestor [Rosuvastatin Calcium] Other (See Comments)    Aches    Latex Hives and Rash    When at dentist    Assessment/Plan:  1. Hyperlipidemia - LDL not at goal <55 mg/dL given progressive ASCVD. He is able to tolerate simvastatin 40 mg daily but cannot tolerate any high intensity statin. After discussion, he is agreeable to trying PCSK9i which would lower LDL by ~60% and reduce cardiac risk. Will submit prior authorization to his insurance and let him know once we have heard back. Will plan labs at that time which the patient would like to do at the [facility]. These labs have been ordered and released. Will also apply the patient for [employer] at that time due to copay of PCSK(9i (~$45) being cost prohibitive. Educated patient on administration and storage of Repatha or Praluent and he confirmed understanding.   Rebbeca Paul, PharmD PGY2 Ambulatory Care Pharmacy Resident 11/09/2021 4:37 PM

## 2021-11-09 ENCOUNTER — Ambulatory Visit: Payer: Medicare Other | Admitting: Student-PharmD

## 2021-11-09 ENCOUNTER — Other Ambulatory Visit: Payer: Self-pay

## 2021-11-09 DIAGNOSIS — E785 Hyperlipidemia, unspecified: Secondary | ICD-10-CM | POA: Diagnosis not present

## 2021-11-09 NOTE — Patient Instructions (Addendum)
Nice to see you today!  Keep up the good work with diet and exercise. Aim for a diet full of vegetables, fruit and lean meats (chicken, [place], fish). Try to limit carbs (bread, pasta, sugar, rice) and red meat consumption.  Your goal LDL is less than 55 mg/dL, you're currently at 124 mg/dL  Medication Changes: I will submit a prior authorization for Repatha or Praluent, depending on which one is preferred by your insurance. I will call to let you know when I hear back and we will schedule your labs and apply for the grant I mentioned.   Continue simvastatin 40 mg daily.   Please give [place] a call at [phone] with any questions or concerns.  For Repatha or Praluent, inject once every other week (any day of the week that works for you) into the fatty skin of stomach, upper outer thigh or back of the arm. Clean the site with soap and warm water or an alcohol pad. Keep the medication in the fridge until you are ready to give your dose, then take it out and let warm up to room temperature for 30-60 mins.

## 2021-11-11 ENCOUNTER — Telehealth: Payer: Self-pay | Admitting: Student-PharmD

## 2021-11-11 MED ORDER — REPATHA SURECLICK 140 MG/ML ~~LOC~~ SOAJ: 140.0000 mg | SUBCUTANEOUS | 11 refills | Status: DC

## 2021-11-11 NOTE — Telephone Encounter (Signed)
Repatha PA approved through 05/09/22.   Called patient to let him know of approval and to apply for [employer]. Was unable to reach, LVM requesting call back.

## 2021-11-11 NOTE — Telephone Encounter (Signed)
Patient returned call. He provided information needed to apply for [employer] which is now approved. Sent Rx for Repatha to his pharmacy and provided the following information to his pharmacy:   CARD NO: [id number] BIN: [id number] PCN: [id number] GROUP: [id number]  Confirmed that they were able to run this successfully to bring cost down to $0.   Patient is going to get labs done at the [facility] in [place] the first week of April per patient preference after he has had at least 3 injections of Repatha. These labs have been ordered and released.

## 2022-01-11 ENCOUNTER — Telehealth: Payer: Self-pay | Admitting: Student-PharmD

## 2022-01-11 NOTE — Telephone Encounter (Signed)
Patient planned to go to [facility] the first week of April to have fasting labs checked after starting Repatha but has not gone yet. Called patient to remind him to go to [place] and he says he will go one day next week. Reports this week will be his 4th dose of Repatha.  ?

## 2022-01-19 LAB — HEPATIC FUNCTION PANEL
ALT: 17 IU/L (ref 0–44)
AST: 21 IU/L (ref 0–40)
Albumin: 4.2 g/dL (ref 3.8–4.8)
Alkaline Phosphatase: 64 IU/L (ref 44–121)
Bilirubin Total: 0.5 mg/dL (ref 0.0–1.2)
Bilirubin, Direct: 0.15 mg/dL (ref 0.00–0.40)
Total Protein: 6.1 g/dL (ref 6.0–8.5)

## 2022-01-19 LAB — LIPID PANEL
Chol/HDL Ratio: 1.7 ratio (ref 0.0–5.0)
Cholesterol, Total: 93 mg/dL — ABNORMAL LOW (ref 100–199)
HDL: 54 mg/dL (ref >39)
LDL Chol Calc (NIH): 20 mg/dL (ref 0–99)
Triglycerides: 98 mg/dL (ref 0–149)
VLDL Cholesterol Cal: 19 mg/dL (ref 5–40)

## 2022-01-26 ENCOUNTER — Ambulatory Visit (HOSPITAL_COMMUNITY): Payer: Medicare Other | Attending: Orthopedic Surgery | Admitting: Physical Therapy

## 2022-01-26 ENCOUNTER — Encounter (HOSPITAL_COMMUNITY): Payer: Self-pay | Admitting: Physical Therapy

## 2022-01-26 DIAGNOSIS — M25572 Pain in left ankle and joints of left foot: Secondary | ICD-10-CM | POA: Diagnosis present

## 2022-01-26 DIAGNOSIS — R29898 Other symptoms and signs involving the musculoskeletal system: Secondary | ICD-10-CM | POA: Diagnosis present

## 2022-01-26 NOTE — Therapy (Signed)
?OUTPATIENT PHYSICAL THERAPY LOWER EXTREMITY EVALUATION ? ? ?Patient Name: Russell Padilla ?MRN: [id number] ?DOB:09/09/1956, 66 y.o., male ?Today's Date: 01/26/2022 ? ? PT End of Session - 01/26/22 1432   ? ? Visit Number 1   ? Number of Visits 8   ? Date for PT Re-Evaluation 02/23/22   ? Authorization Type UHC Medicare (no auth, no VL)   ? PT Start Time 7106   ? PT Stop Time 1525   ? PT Time Calculation (min) 52 min   ? Activity Tolerance Patient tolerated treatment well   ? Behavior During Therapy [facility] for tasks assessed/performed   ? ?  ?  ? ?  ? ? ?Past Medical History:  ?Diagnosis Date  ? Abdominal pain 07-11-13  ? ongoing and unspecified  ? Arthritis   ? Bladder stone 07-11-13  ? surgery planned  ? CAD (coronary artery disease)   ? a. 12/2012 Cath/PCI: DES to superior branch of OM2;  b. Repeat cath 4/14 with 20% LAD, patent stent OM3, 70% diffuse stenosis small, non-dominant RCA.  ? Essential hypertension   ? Headache   ? HLD (hyperlipidemia)   ? Renal disorder   ? renal calculi  ? Stomach pain   ? chronic  ? ?Past Surgical History:  ?Procedure Laterality Date  ? BACK SURGERY    ? 2 cervical/ 2 lumbar fusions  ? CARDIAC CATHETERIZATION N/A 03/04/2015  ? Procedure: Left Heart Cath and Coronary Angiography;  Surgeon: Burnell Blanks, MD;  Location: [place] [facility];  Service: Cardiovascular;  Laterality: N/A;  ? CHOLECYSTECTOMY    ? laparoscopic.  ? CORONARY STENT PLACEMENT    ? 3'14  ? ESOPHAGOGASTRODUODENOSCOPY ENDOSCOPY    ? multiple times-[facility]/ and now being followed by Endo- [facility]  ? KNEE ARTHROSCOPY Left   ? '80's  ? LEFT HEART CATHETERIZATION WITH CORONARY ANGIOGRAM N/A 12/05/2012  ? Procedure: LEFT HEART CATHETERIZATION WITH CORONARY ANGIOGRAM;  Surgeon: Birdie Riddle, MD;  Location: [place] CATH LAB;  Service: Cardiovascular;  Laterality: N/A;  ? LEFT HEART CATHETERIZATION WITH CORONARY ANGIOGRAM N/A 01/17/2013  ? Procedure: LEFT HEART CATHETERIZATION WITH CORONARY ANGIOGRAM;  Surgeon: Wellington Hampshire, MD;  Location: [place] C[facility] LAB;  Service: Cardiovascular;  Laterality: N/A;  ? PERCUTANEOUS CORONARY STENT INTERVENTION (PCI-S) N/A 12/06/2012  ? Procedure: PERCUTANEOUS CORONARY STENT INTERVENTION (PCI-S);  Surgeon: Burnell Blanks, MD;  Location: [facility] CATH LAB;  Service: Cardiovascular;  Laterality: N/A;  ? ?Patient Active Problem List  ? Diagnosis Date Noted  ? Right lumbar radiculopathy 03/01/2016  ? Chronic headache 03/01/2016  ? Frequent headaches 08/11/2015  ? Chronic neck pain 08/11/2015  ? Chronic low back pain 08/11/2015  ? Coronary artery disease involving native coronary artery of native heart without angina pectoris 04/13/2015  ? Abnormal stress test   ? Pain of upper abdomen 02/16/2015  ? Change in blood platelet count 02/16/2015  ? Essential hypertension   ? HLD (hyperlipidemia)   ? Benign prostatic hypertrophy without urinary obstruction 12/22/2014  ? Decreased libido 12/22/2014  ? 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 Dyspnea 12/31/2013  ? Left elbow pain 11/29/2013  ? Chest pain, non-cardiac 10/16/2013  ? Chest pain 01/16/2013  ? Bradycardia 01/16/2013  ? Chest pain at rest 12/05/2012  ? Achalasia 07/21/2011  ? HEMORRHOIDS 11/02/2010  ? 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  ? CONSTIPATION 11/02/2010  ? DYSPHAGIA 11/02/2010  ? FLATULENCE-GAS-BLOATING 11/02/2010  ? GERD 08/10/2009  ? ABDOMINAL PAIN RIGHT UPPER QUADRANT 08/10/2009  ? Nonspecific (abnormal) findings on  radiological and other examination of biliary tract 08/10/2009  ? PERSONAL HX COLONIC POLYPS 08/10/2009  ? 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 THROMBOCYTOPENIA, CHRONIC 05/13/2009  ? 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 NAUSEA 05/13/2009  ? ABDOMINAL PAIN-EPIGASTRIC 05/13/2009  ? ? ?PCP: Sharilyn Sites, MD ? ?REFERRING PROVIDER: Wylene Simmer, MD ? ?REFERRING DIAG: M77.52 other enthesopathy of left foot and ankle  ? ?THERAPY DIAG:  ?Pain in left ankle and joints of left foot ? ?Other symptoms and signs involving the musculoskeletal system ? ?ONSET DATE:  5-6 months ? ?SUBJECTIVE:  ? ?SUBJECTIVE STATEMENT: ?Patient states pain began about 5-6 months ago after hitting heal on rocking chair.   Per referral "HEP for eccentric strenfthening of gastroc soleus. 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 Pain worse after sitting, pressure. Stairs painful.  ? ?PERTINENT HISTORY: ?Chronic back pain ? ?PAIN:  ?Are you having pain? Yes: NPRS scale: 0 when relaxed, with pressure 9/10 ?Pain location: L heel ?Pain description: sharp ?Aggravating factors: pressure, movement ?Relieving factors: rest ? ?PRECAUTIONS: None ? ?WEIGHT BEARING RESTRICTIONS No ? ?FALLS:  ?Has patient fallen in last 6 months? No ? ?LIVING ENVIRONMENT: ?Lives with: lives with their spouse ?Lives in: House/apartment ?Stairs: Yes: External: 12-13 steps; on right going up and on left going up ?Has following equipment at home: None ? ?OCCUPATION: Retired ? ?PLOF: Independent ? ?PATIENT GOALS decrease pain ? ? ?OBJECTIVE:  ? ?DIAGNOSTIC FINDINGS: XR 08/11/21 IMPRESSION: ?No recent fracture is seen in the left ankle. 3 mm smooth marginated ?calcification adjacent to the medial malleolus may be residual from ?previous injury. Small plantar spur is seen in calcaneus. Bony spurs ?seen in the dorsal aspect of talonavicular joint. ? ?PATIENT SURVEYS:  ?FOTO complete next session ? ?COGNITION: ? Overall cognitive status: Within functional limits for tasks assessed   ?  ?SENSATION: ?WFL ? ? ?PALPATION: ?TTP distal achilles tendon above insertion, posterior inferior to medial malleolus just above calcaneous, Medial and lateral gastroc, TTP L retrocalcaneal bursae ? ?LE ROM: ? ?Active ROM Right ?01/26/2022 Left ?01/26/2022  ?Hip flexion    ?Hip extension    ?Hip abduction    ?Hip adduction    ?Hip internal rotation    ?Hip external rotation    ?Knee flexion    ?Knee extension    ?Ankle dorsiflexion 10 5  ?Ankle plantarflexion 49 39   ?Ankle inversion 35 31  ?Ankle eversion 10 10  ? (Blank rows = not tested) ? ?LE MMT: ? ?MMT Right ?01/26/2022 Left ?01/26/2022  ?Hip flexion    ?Hip extension    ?Hip abduction    ?Hip adduction    ?Hip internal rotation    ?Hip external rotation    ?Knee flexion    ?Knee extension    ?Ankle dorsiflexion  5/5  ?Ankle plantarflexion    ?Ankle inversion  5/5  ?Ankle eversion  5/5  ? (Blank rows = not tested) ? ? ?FUNCTIONAL TESTS:  ?Heel raise: requires heavy UE support bilaterally with bilateral LE to complete  ? ?GAIT: ?Distance walked: 100 ?Assistive device utilized: None ?Level of assistance: Complete Independence ?Comments: unsteady first couple steps, foot ER LLE ? ? ? ?TODAY'S TREATMENT: ?01/26/22 ?Calf stretch 3x 20 second holds ?Ankle pumps 1x 20 ?Heel/TR seated 1x 20  ?Ankle edema massage ? ? ?PATIENT EDUCATION:  ?Education details: Patient educated on exam findings, POC, scope of PT, HEP, and ankle anatomy, ionto/dex, massage to ankle . ?Person  educated: Patient ?Education method: Explanation, Demonstration, and Handouts ?Education comprehension: verbalized understanding, returned demonstration, verbal cues required, and tactile cues required ? ? ? ?HOME EXERCISE PROGRAM: ?4/26/23Access Code: [email or web address] ?Exercises ?- Seated Calf Towel Stretch  - 3 x daily - 7 x weekly - 3 reps - 20-30 second hold ?- Seated Ankle Pumps  - 3 x daily - 7 x weekly - 1 sets - 20 reps ?- Seated Heel Toe Raises  - 3 x daily - 7 x weekly - 2 sets - 10 reps ? ?ASSESSMENT: ? ?CLINICAL IMPRESSION: ?Patient a 66 y.o. y.o. male who was seen today for physical therapy evaluation and treatment for L foot/ankle pain.  Patient will continue to benefit from physical therapy in order to improve function and reduce impairment.  ? ? ?OBJECTIVE IMPAIRMENTS Abnormal gait, decreased activity tolerance, decreased balance, decreased endurance, decreased mobility, difficulty walking, decreased ROM, decreased strength, hypomobility, increased muscle  spasms, impaired flexibility, improper body mechanics, and pain.  ? ?ACTIVITY LIMITATIONS cleaning, community activity, meal prep, laundry, yard work, and shopping.  ? ?PERSONAL FACTORS Fitness and Time since onset of

## 2022-02-10 ENCOUNTER — Ambulatory Visit (HOSPITAL_COMMUNITY): Payer: Medicare Other | Attending: Orthopedic Surgery

## 2022-02-10 ENCOUNTER — Encounter (HOSPITAL_COMMUNITY): Payer: Self-pay

## 2022-02-10 DIAGNOSIS — M25572 Pain in left ankle and joints of left foot: Secondary | ICD-10-CM | POA: Insufficient documentation

## 2022-02-10 DIAGNOSIS — R29898 Other symptoms and signs involving the musculoskeletal system: Secondary | ICD-10-CM | POA: Diagnosis present

## 2022-02-10 NOTE — Therapy (Signed)
?OUTPATIENT PHYSICAL THERAPY LOWER EXTREMITY TREATMENT ? ? ?Patient Name: Russell Padilla ?MRN: [id number] ?DOB:10-27-1955, 66 y.o., male ?Today's Date: 02/10/2022 ? ? PT End of Session - 02/10/22 1846   ? ? Visit Number 2   ? Number of Visits 8   ? Date for PT Re-Evaluation 02/23/22   ? Authorization Type [employer]HC Medicare (no auth, no VL)   ? PT Start Time 4034   late arrival  ? PT Stop Time 1825   ? PT Time Calculation (min) 33 min   ? Activity Tolerance Patient tolerated treatment well   ? Behavior During Therapy [facility] for tasks assessed/performed   ? ?  ?  ? ?  ? ? ? ?Past Medical History:  ?Diagnosis Date  ? Abdominal pain 07-11-13  ? ongoing and unspecified  ? Arthritis   ? Bladder stone 07-11-13  ? surgery planned  ? CAD (coronary artery disease)   ? a. 12/2012 Cath/PCI: DES to superior branch of OM2;  b. Repeat cath 4/14 with 20% LAD, patent stent OM3, 70% diffuse stenosis small, non-dominant RCA.  ? Essential hypertension   ? Headache   ? HLD (hyperlipidemia)   ? Renal disorder   ? renal calculi  ? Stomach pain   ? chronic  ? ?Past Surgical History:  ?Procedure Laterality Date  ? BACK SURGERY    ? 2 cervical/ 2 lumbar fusions  ? CARDIAC CATHETERIZATION N/A 03/04/2015  ? Procedure: Left Heart Cath and Coronary Angiography;  Surgeon: Burnell Blanks, MD;  Location: [place] [facility];  Service: Cardiovascular;  Laterality: N/A;  ? CHOLECYSTECTOMY    ? laparoscopic.  ? CORONARY STENT PLACEMENT    ? 3'14  ? ESOPHAGOGASTRODUODENOSCOPY ENDOSCOPY    ? multiple times-[facility]/ and now being followed by Endo- [facility]  ? KNEE ARTHROSCOPY Left   ? '80's  ? LEFT HEART CATHETERIZATION WITH CORONARY ANGIOGRAM N/A 12/05/2012  ? Procedure: LEFT HEART CATHETERIZATION WITH CORONARY ANGIOGRAM;  Surgeon: Birdie Riddle, MD;  Location: [place] CATH LAB;  Service: Cardiovascular;  Laterality: N/A;  ? LEFT HEART CATHETERIZATION WITH CORONARY ANGIOGRAM N/A 01/17/2013  ? Procedure: LEFT HEART CATHETERIZATION WITH CORONARY ANGIOGRAM;  Surgeon:  Wellington Hampshire, MD;  Location: [place] CATH LAB;  Service: Cardiovascular;  Laterality: N/A;  ? PERCUTANEOUS CORONARY STENT INTERVENTION (PCI-S) N/A 12/06/2012  ? Procedure: PERCUTANEOUS CORONARY STENT INTERVENTION (PCI-S);  Surgeon: Burnell Blanks, MD;  Location: [facility] C[facility] LAB;  Service: Cardiovascular;  Laterality: N/A;  ? ?Patient Active Problem List  ? Diagnosis Date Noted  ? Right lumbar radiculopathy 03/01/2016  ? Chronic headache 03/01/2016  ? Frequent headaches 08/11/2015  ? Chronic neck pain 08/11/2015  ? Chronic low back pain 08/11/2015  ? Coronary artery disease involving native coronary artery of native heart without angina pectoris 04/13/2015  ? Abnormal stress test   ? Pain of upper abdomen 02/16/2015  ? Change in blood platelet count 02/16/2015  ? Essential hypertension   ? HLD (hyperlipidemia)   ? Benign prostatic hypertrophy without urinary obstruction 12/22/2014  ? Decreased libido 12/22/2014  ? 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 Dyspnea 12/31/2013  ? Left elbow pain 11/29/2013  ? Chest pain, non-cardiac 10/16/2013  ? Chest pain 01/16/2013  ? Bradycardia 01/16/2013  ? Chest pain at rest 12/05/2012  ? Achalasia 07/21/2011  ? HEMORRHOIDS 11/02/2010  ? 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  ? CONSTIPATION 11/02/2010  ? DYSPHAGIA 11/02/2010  ? FLATULENCE-GAS-BLOATING 11/02/2010  ? GERD 08/10/2009  ? ABDOMINAL PAIN RIGHT UPPER QUADRANT 08/10/2009  ?  Nonspecific (abnormal) findings on radiological and other examination of biliary tract 08/10/2009  ? PERSONAL HX COLONIC POLYPS 08/10/2009  ? 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 THROMBOCYTOPENIA, CHRONIC 05/13/2009  ? 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 NAUSEA 05/13/2009  ? ABDOMINAL PAIN-EPIGASTRIC 05/13/2009  ? ? ?PCP: Sharilyn Sites, MD ? ?REFERRING PROVIDER: Sharilyn Sites, MD ? ?REFERRING DIAG: M77.52 other enthesopathy of left foot and ankle  ? ?THERAPY DIAG:  ?Pain in left ankle and joints of left foot ? ?ONSET DATE: 5-6 months ? ?SUBJECTIVE:  ? ?SUBJECTIVE STATEMENT: ?Pt  stated he is feeling good today, stated he feels the exercises are helpful.   ? ? ? ?PERTINENT HISTORY: ?Chronic back pain ? ?PAIN:  ?Are you having pain? Yes: NPRS scale: 0 when relaxed, with pressure 9/10 ?Pain location: L heel ?Pain description: sharp ?Aggravating factors: pressure, movement ?Relieving factors: rest ? ?PRECAUTIONS: None ? ?WEIGHT BEARING RESTRICTIONS No ? ?FALLS:  ?Has patient fallen in last 6 months? No ? ?LIVING ENVIRONMENT: ?Lives with: lives with their spouse ?Lives in: House/apartment ?Stairs: Yes: External: 12-13 steps; on right going up and on left going up ?Has following equipment at home: None ? ?OCCUPATION: Retired ? ?PLOF: Independent ? ?PATIENT GOALS decrease pain ? ? ?OBJECTIVE:  ? ?DIAGNOSTIC FINDINGS: XR 08/11/21 IMPRESSION: ?No recent fracture is seen in the left ankle. 3 mm smooth marginated ?calcification adjacent to the medial malleolus may be residual from ?previous injury. Small plantar spur is seen in calcaneus. Bony spurs ?seen in the dorsal aspect of talonavicular joint. ? ?PATIENT SURVEYS:  ?FOTO complete next session ? ?COGNITION: ? Overall cognitive status: Within functional limits for tasks assessed   ?  ?SENSATION: ?WFL ? ? ?PALPATION: ?TTP distal achilles tendon above insertion, posterior inferior to medial malleolus just above calcaneous, Medial and lateral gastroc, TTP L retrocalcaneal bursae ? ?LE ROM: ? ?Active ROM Right ?EVAL Left ?Eval  ?Hip flexion    ?Hip extension    ?Hip abduction    ?Hip adduction    ?Hip internal rotation    ?Hip external rotation    ?Knee flexion    ?Knee extension    ?Ankle dorsiflexion 10 5  ?Ankle plantarflexion 49 39  ?Ankle inversion 35 31  ?Ankle eversion 10 10  ? (Blank rows = not tested) ? ?LE MMT: ? ?MMT Right ?Eval Left ?Eval  ?Hip flexion    ?Hip extension    ?Hip abduction    ?Hip adduction    ?Hip internal rotation    ?Hip external rotation    ?Knee flexion    ?Knee extension    ?Ankle dorsiflexion  5/5  ?Ankle plantarflexion     ?Ankle inversion  5/5  ?Ankle eversion  5/5  ? (Blank rows = not tested) ? ? ?FUNCTIONAL TESTS:  ?Heel raise: requires heavy UE support bilaterally with bilateral LE to complete  ? ?GAIT: ?Distance walked: 100 ?Assistive device utilized: None ?Level of assistance: Complete Independence ?Comments: unsteady first couple steps, foot ER LLE ? ? ? ?TODAY'S TREATMENT: ?02/10/22: ?Seated: Reviewed seated HEP ?BAPS L3 ?Standing: ?Heel and toe raises 15x each ?SLS Rt 34" Lt 21" ?Slant board ? ?Ionto patch, encouraged to keep for 14 hours and observe skin integrity following. ? ?01/26/22 ?Calf stretch 3x 20 second holds ?Ankle pumps 1x 20 ?Heel/TR seated 1x 20  ?Ankle edema massage ? ? ?PATIENT EDUCATION:  ?Education details: Patient educated on exam findings, POC, scope of PT, HEP, and ankle anatomy, ionto/dex, massage to ankle . ?Person educated: Patient ?Education method: Explanation, Demonstration,  and Handouts ?Education comprehension: verbalized understanding, returned demonstration, verbal cues required, and tactile cues required ? ? ? ?HOME EXERCISE PROGRAM: ?4/26/23Access Code: [email or web address] ?Exercises ?- Seated Calf Towel Stretch  - 3 x daily - 7 x weekly - 3 reps - 20-30 second hold ?- Seated Ankle Pumps  - 3 x daily - 7 x weekly - 1 sets - 20 reps ?- Seated Heel Toe Raises  - 3 x daily - 7 x weekly - 2 sets - 10 reps ? ?5/11: Standing heel/toe raises and SLS ?ASSESSMENT: ? ?CLINICAL IMPRESSION: ?Reviewed goals, educated importance of HEP compliance for maximal benefits, pt able to recall and demonstrate appropriate mechanics.  Session focus with ankle mobility, strengthening and pain control.  Pt able to complete all exercises with good form and no reports of pain through session.  Advanced HEP to standing heel/toe raises and SLS.  EOS with ionto, encouraged to leave for 14 hours then observe skin integrity following.   ? ? ?OBJECTIVE IMPAIRMENTS Abnormal gait, decreased activity tolerance, decreased balance,  decreased endurance, decreased mobility, difficulty walking, decreased ROM, decreased strength, hypomobility, increased muscle spasms, impaired flexibility, improper body mechanics, and pain.  ? ?ACTIVITY LI

## 2022-02-23 ENCOUNTER — Ambulatory Visit (HOSPITAL_COMMUNITY): Payer: Medicare Other

## 2022-02-23 ENCOUNTER — Encounter (HOSPITAL_COMMUNITY): Payer: Self-pay

## 2022-02-23 DIAGNOSIS — R29898 Other symptoms and signs involving the musculoskeletal system: Secondary | ICD-10-CM

## 2022-02-23 DIAGNOSIS — M25572 Pain in left ankle and joints of left foot: Secondary | ICD-10-CM

## 2022-02-23 NOTE — Therapy (Signed)
OUTPATIENT PHYSICAL THERAPY LOWER EXTREMITY TREATMENT   Patient Name: Russell Padilla MRN: [id number] DOB:[birth year], 66 y.o., male Today's Date: 02/24/2022   PT End of Session - 02/23/22 1758     Visit Number 3    Number of Visits 16    Date for PT Re-Evaluation 03/23/22    Authorization Type UHC Medicare (no auth, no VL)    PT Start Time 1755    PT Stop Time 1834    PT Time Calculation (min) 39 min    Activity Tolerance Patient tolerated treatment well    Behavior During Therapy [facility] for tasks assessed/performed               Past Medical History:  Diagnosis Date   Abdominal pain 07-11-13   ongoing and unspecified   Arthritis    Bladder stone 07-11-13   surgery planned   CAD (coronary artery disease)    a. 12/2012 Cath/PCI: DES to superior branch of OM2;  b. Repeat cath 4/14 with 20% LAD, patent stent OM3, 70% diffuse stenosis small, non-dominant RCA.   Essential hypertension    Headache    HLD (hyperlipidemia)    Renal disorder    renal calculi   Stomach pain    chronic   Past Surgical History:  Procedure Laterality Date   BACK SURGERY     2 cervical/ 2 lumbar fusions   CARDIAC CATHETERIZATION N/A 03/04/2015   Procedure: Left Heart Cath and Coronary Angiography;  Surgeon: Burnell Blanks, MD;  Location: [place] [facility];  Service: Cardiovascular;  Laterality: N/A;   CHOLECYSTECTOMY     laparoscopic.   CORONARY STENT PLACEMENT     3'14   ESOPHAGOGASTRODUODENOSCOPY ENDOSCOPY     multiple times-[facility]/ and now being followed by Endo- MD [facility]   KNEE ARTHROSCOPY Left    '80's   LEFT HEART CATHETERIZATION WITH CORONARY ANGIOGRAM N/A 12/05/2012   Procedure: LEFT HEART CATHETERIZATION WITH CORONARY ANGIOGRAM;  Surgeon: Birdie Riddle, MD;  Location: [place] CATH [facility];  Service: Cardiovascular;  Laterality: N/A;   LEFT HEART CATHETERIZATION WITH CORONARY ANGIOGRAM N/A 01/17/2013   Procedure: LEFT HEART CATHETERIZATION WITH CORONARY ANGIOGRAM;  Surgeon: Wellington Hampshire, MD;  Location: [place] [facility];  Service: Cardiovascular;  Laterality: N/A;   PERCUTANEOUS CORONARY STENT INTERVENTION (PCI-S) N/A 12/06/2012   Procedure: PERCUTANEOUS CORONARY STENT INTERVENTION (PCI-S);  Surgeon: Burnell Blanks, MD;  Location: [facility] C[facility];  Service: Cardiovascular;  Laterality: N/A;   Patient Active Problem List   Diagnosis Date Noted   Right lumbar radiculopathy 03/01/2016   Chronic headache 03/01/2016   Frequent headaches 08/11/2015   Chronic neck pain 08/11/2015   Chronic low back pain 08/11/2015   Coronary artery disease involving native coronary artery of native heart without angina pectoris 04/13/2015   Abnormal stress test    Pain of upper abdomen 02/16/2015   Change in blood platelet count 02/16/2015   Essential hypertension    HLD (hyperlipidemia)    Benign prostatic hypertrophy without urinary obstruction 12/22/2014   Decreased libido 12/22/2014   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   Dyspnea 12/31/2013   Left elbow pain 11/29/2013   Chest pain, non-cardiac 10/16/2013   Chest pain 01/16/2013   Bradycardia 01/16/2013   Chest pain at rest 12/05/2012   Achalasia 07/21/2011   HEMORRHOIDS 11/02/2010   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   CONSTIPATION 11/02/2010   DYSPHAGIA 11/02/2010   FLATULENCE-GAS-BLOATING 11/02/2010   GERD 08/10/2009   ABDOMINAL PAIN RIGHT UPPER QUADRANT 08/10/2009   Nonspecific (abnormal)  findings on radiological and other examination of biliary tract 08/10/2009   PERSONAL HX COLONIC POLYPS 08/10/2009   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  Currently pain scale 4/10, increased to 7/10 with pressure.      PERTINENT HISTORY: Chronic back pain  PAIN:  Are you having pain? Yes: NPRS scale: 4/10 at rest, with pressure 7/10 Pain location: L heel Pain description: sharp Aggravating factors: pressure, movement Relieving factors: rest  PRECAUTIONS: None  WEIGHT BEARING RESTRICTIONS No  FALLS:  Has patient fallen in last 6 months? No  LIVING ENVIRONMENT: Lives with: lives with their spouse Lives in: House/apartment Stairs: Yes: External: 12-13 steps; on right going up and on left going up Has following equipment at home: None  OCCUPATION: Retired  PLOF: Independent  PATIENT GOALS decrease pain   OBJECTIVE:   DIAGNOSTIC FINDINGS: XR 08/11/21 IMPRESSION: No recent fracture is seen in the left ankle. 3 mm smooth marginated calcification adjacent to the medial malleolus may be residual from previous injury. Small plantar spur is seen in calcaneus. Bony spurs seen in the dorsal aspect of talonavicular joint.  PATIENT SURVEYS:  FOTO complete next session  COGNITION:  Overall cognitive status: Within functional limits for tasks assessed     SENSATION: WFL   PALPATION: TTP distal achilles tendon above insertion, posterior inferior to medial malleolus just above calcaneous, Medial and lateral gastroc, TTP L retrocalcaneal bursae  LE ROM:  Active ROM Right EVAL Left Eval Left  02/23/22  Hip flexion     Hip extension     Hip abduction     Hip adduction     Hip internal rotation     Hip external rotation     Knee flexion     Knee extension     Ankle dorsiflexion 10 5 6   Ankle plantarflexion 49 39   Ankle inversion 35 31 35  Ankle eversion 10 10 14     (Blank rows = not tested)  LE MMT:  MMT Right Eval Left Eval  Hip flexion    Hip extension    Hip abduction    Hip adduction     Hip internal rotation    Hip external rotation    Knee flexion    Knee extension    Ankle dorsiflexion  5/5  Ankle plantarflexion    Ankle inversion  5/5  Ankle eversion  5/5   (Blank rows = not tested)   FUNCTIONAL TESTS:  Heel raise: requires heavy UE support bilaterally with bilateral LE to complete   GAIT: Distance walked: 100 Assistive device utilized: None Level of assistance: Complete Independence Comments: unsteady first couple steps, foot ER LLE    TODAY'S TREATMENT: 02/23/22:   Heel and toe raises 15x each incline slope   Steps reciprocal pattern 3RT 7in   Plantar flexion stretch on bottom step 3x 30"    SLS Lt 27", Rt 38"   Vector stance 2x 5"   Gastroc stretch against wall 3x 30"   BAPS L3  Ionto patch, encouraged for 14 hours and observe skin integrity following.    02/10/22: Seated: Reviewed seated HEP BAPS L3 Standing: Heel and toe raises 15x each SLS Rt 34" Lt 21" Slant board  Ionto patch, encouraged to keep for 14 hours and observe skin integrity following.  01/26/22 Calf stretch 3x 20 second holds Ankle pumps 1x 20 Heel/TR seated 1x 20  Ankle edema massage   PATIENT EDUCATION:  Education details: Patient educated on exam findings, POC, scope of PT, HEP, and ankle anatomy, ionto/dex, massage to ankle . Person educated: Patient Education method: Explanation, Demonstration, and Handouts Education comprehension: verbalized understanding, returned demonstration, verbal cues required, and tactile cues required    HOME EXERCISE PROGRAM: 4/26/23Access Code: [email or web address] Exercises - Seated Calf Towel Stretch  - 3 x daily - 7 x weekly - 3 reps - 20-30 second hold - Seated Ankle Pumps  - 3 x daily - 7 x weekly - 1 sets - 20 reps - Seated Heel Toe Raises  - 3 x daily - 7 x weekly - 2 sets - 10 reps  5/11: Standing heel/toe raises and SLS 02/23/22: Gastroc stretch again wall and plantar fascia on step ASSESSMENT:  CLINICAL IMPRESSION: Reviewed  goals per cert.  Pt reports he feels improvements with current HEP program and reports ionto treatment assisted for a couple days following last treatment.  Added stretches to HEP for mobility.  Pt able to ambulate reciprocal pattern stairs with good mechanics, reports of increased pain following.  ROM assessed with improvement all around, continues to be limited with DF at 6 degrees.  EOS with ionto, encouraged to keep on for 14 hours and assess skin integrity upon removal.   Assessment: Patient has met 2/3 short term goals and 0/3 long term goals with ability to complete HEP and improvement in symptoms. He remains limited by continued symptoms and deficits in ROM and functional mobility. 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 Extending POC to continue to improve symptoms and mobility. Patient will continue to benefit from physical therapy in order to improve function and reduce impairment. 10:26 AM, 02/24/22 Mearl Latin PT, DPT Physical Therapist at [facility]    OBJECTIVE IMPAIRMENTS Abnormal gait, decreased activity tolerance, decreased balance, decreased endurance, decreased mobility, difficulty walking, decreased ROM, decreased strength, hypomobility, increased muscle spasms, impaired flexibility, improper body mechanics, and pain.   ACTIVITY LIMITATIONS cleaning, community activity, meal prep, laundry, yard work, and shopping.   PERSONAL FACTORS Fitness and Time since onset of injury/illness/exacerbation are also affecting patient's functional outcome.    REHAB POTENTIAL: Good  CLINICAL DECISION MAKING: Stable/uncomplicated  EVALUATION COMPLEXITY: Low   GOALS: Goals reviewed with patient? No  SHORT TERM GOALS: Target date: 02/09/2022  Patient will be independent with HEP in order to improve functional outcomes. Baseline: 02/23/22:  Reports compliance with HEP daily Goal status: MET  2.  Patient will  report at least 25% improvement in symptoms for improved quality of life. Baseline: 02/23/22: 40% improvements Goal status: Met  3.  Patient will demonstrate at least 10 degrees L ankle DF for improved gait mechanics Baseline: 02/23/22: Lt foot DF at 6 degrees Goal status: Ongoing    LONG TERM GOALS: Target date:02/23/2022  Patient will report at least 75% improvement in symptoms for improved quality of life. Baseline: 02/23/22: 40% improvements Goal status: Ongoing  2.  Patient will be able to return to all activities unrestricted for improved ability to  perform work functions and participate with family.  Baseline:  02/23/22:  Continues to be limited by pain Goal status: Ongoing  3.  Patient will be able to navigate stairs with reciprocal pattern without compensation or pain no greater than 2/10 in order to demonstrate improved LE strength.  Baseline: 02/23/22:  Able to navigate stairs with reciprocal pattern with increased pain to 7/10 Goal status: Ongoing       PLAN: PT FREQUENCY: 2x/week  PT DURATION: 4 weeks  PLANNED INTERVENTIONS: Therapeutic exercises, Therapeutic activity, Neuromuscular re-education, Balance training, Gait training, Patient/Family education, Joint manipulation, Joint mobilization, Stair training, Orthotic/Fit training, DME instructions, Aquatic Therapy, Dry Needling, Electrical stimulation, Spinal manipulation, Spinal mobilization, Cryotherapy, Moist heat, Compression bandaging, scar mobilization, Splintting, Taping, Traction, Ultrasound, Ionotophoresis 51m/ml Dexamethasone, and Manual therapy   PLAN FOR NEXT SESSION: ankle mobility, eccentric gastoc/soleus strength, dex/ionto, add standing calf stretch next session.  CIhor Austin LPTA/CLT; CBIS [phone] BullsZaunegger, PT 02/24/2022, 10:20 AM

## 2022-02-24 NOTE — Addendum Note (Signed)
Addended by: Mearl Latin on: 02/24/2022 10:29 AM   Modules accepted: Orders

## 2022-03-01 ENCOUNTER — Encounter (HOSPITAL_COMMUNITY): Payer: Medicare Other

## 2022-03-02 ENCOUNTER — Encounter (HOSPITAL_COMMUNITY): Payer: Self-pay

## 2022-03-02 ENCOUNTER — Ambulatory Visit (HOSPITAL_COMMUNITY): Payer: Medicare Other

## 2022-03-02 DIAGNOSIS — M25572 Pain in left ankle and joints of left foot: Secondary | ICD-10-CM

## 2022-03-02 DIAGNOSIS — R29898 Other symptoms and signs involving the musculoskeletal system: Secondary | ICD-10-CM

## 2022-03-02 NOTE — Therapy (Signed)
OUTPATIENT PHYSICAL THERAPY LOWER EXTREMITY TREATMENT   Patient Name: Russell Padilla MRN: [id number] DOB:06/20/1956, 66 y.o., male Today's Date: 03/02/2022   PT End of Session - 03/02/22 1726     Visit Number 4    Number of Visits 16    Date for PT Re-Evaluation 03/23/22    Authorization Type UHC Medicare (no auth, no VL)    PT Start Time 1534    PT Stop Time 1615    PT Time Calculation (min) 41 min    Activity Tolerance Patient tolerated treatment well    Behavior During Therapy WFL for tasks assessed/performed                Past Medical History:  Diagnosis Date   Abdominal pain 07-11-13   ongoing and unspecified   Arthritis    Bladder stone 07-11-13   surgery planned   CAD (coronary artery disease)    a. 12/2012 Cath/PCI: DES to superior branch of OM2;  b. Repeat cath 4/14 with 20% LAD, patent stent OM3, 70% diffuse stenosis small, non-dominant RCA.   Essential hypertension    Headache    HLD (hyperlipidemia)    Renal disorder    renal calculi   Stomach pain    chronic   Past Surgical History:  Procedure Laterality Date   BACK SURGERY     2 cervical/ 2 lumbar fusions   CARDIAC CATHETERIZATION N/A 03/04/2015   Procedure: Left Heart Cath and Coronary Angiography;  Surgeon: Burnell Blanks, MD;  Location: [place] [facility];  Service: Cardiovascular;  Laterality: N/A;   CHOLECYSTECTOMY     laparoscopic.   CORONARY STENT PLACEMENT     3'14   ESOPHAGOGASTRODUODENOSCOPY ENDOSCOPY     multiple times-[facility]/ and now being followed by Endo- [facility]   KNEE ARTHROSCOPY Left    '80's   LEFT HEART CATHETERIZATION WITH CORONARY ANGIOGRAM N/A 12/05/2012   Procedure: LEFT HEART CATHETERIZATION WITH CORONARY ANGIOGRAM;  Surgeon: Birdie Riddle, MD;  Location: [place] CATH LAB;  Service: Cardiovascular;  Laterality: N/A;   LEFT HEART CATHETERIZATION WITH CORONARY ANGIOGRAM N/A 01/17/2013   Procedure: LEFT HEART CATHETERIZATION WITH CORONARY ANGIOGRAM;  Surgeon: Wellington Hampshire, MD;  Location: [place] CATH LAB;  Service: Cardiovascular;  Laterality: N/A;   PERCUTANEOUS CORONARY STENT INTERVENTION (PCI-S) N/A 12/06/2012   Procedure: PERCUTANEOUS CORONARY STENT INTERVENTION (PCI-S);  Surgeon: Burnell Blanks, MD;  Location: [facility] C[facility];  Service: Cardiovascular;  Laterality: N/A;   Patient Active Problem List   Diagnosis Date Noted   Right lumbar radiculopathy 03/01/2016   Chronic headache 03/01/2016   Frequent headaches 08/11/2015   Chronic neck pain 08/11/2015   Chronic low back pain 08/11/2015   Coronary artery disease involving native coronary artery of native heart without angina pectoris 04/13/2015   Abnormal stress test    Pain of upper abdomen 02/16/2015   Change in blood platelet count 02/16/2015   Essential hypertension    HLD (hyperlipidemia)    Benign prostatic hypertrophy without urinary obstruction 12/22/2014   Decreased libido 12/22/2014   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   Dyspnea 12/31/2013   Left elbow pain 11/29/2013   Chest pain, non-cardiac 10/16/2013   Chest pain 01/16/2013   Bradycardia 01/16/2013   Chest pain at rest 12/05/2012   Achalasia 07/21/2011   HEMORRHOIDS 11/02/2010   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   CONSTIPATION 11/02/2010   DYSPHAGIA 11/02/2010   FLATULENCE-GAS-BLOATING 11/02/2010   GERD 08/10/2009   ABDOMINAL PAIN RIGHT UPPER QUADRANT 08/10/2009   Nonspecific (  abnormal) findings on radiological and other examination of biliary tract 08/10/2009   PERSONAL HX COLONIC POLYPS 08/10/2009   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  Foot pain scale 6-7/10.  Reports difficulty sleeping last night, wife was up all night.  Ionto helped some.    PERTINENT HISTORY: Chronic back pain  PAIN:  Are you having pain? Yes: NPRS scale: 6-7/10 Pain location: L heel Pain description: sharp Aggravating factors: pressure, movement Relieving factors: rest  PRECAUTIONS: None  WEIGHT BEARING RESTRICTIONS No  FALLS:  Has patient fallen in last 6 months? No  LIVING ENVIRONMENT: Lives with: lives with their spouse Lives in: House/apartment Stairs: Yes: External: 12-13 steps; on right going up and on left going up Has following equipment at home: None  OCCUPATION: Retired  PLOF: Independent  PATIENT GOALS decrease pain   OBJECTIVE:   DIAGNOSTIC FINDINGS: XR 08/11/21 IMPRESSION: No recent fracture is seen in the left ankle. 3 mm smooth marginated calcification adjacent to the medial malleolus may be residual from previous injury. Small plantar spur is seen in calcaneus. Bony spurs seen in the dorsal aspect of talonavicular joint.  PATIENT SURVEYS:  FOTO complete next session  COGNITION:  Overall cognitive status: Within functional limits for tasks assessed     SENSATION: WFL   PALPATION: TTP distal achilles tendon above insertion, posterior inferior to medial malleolus just above calcaneous, Medial and lateral gastroc, TTP L retrocalcaneal bursae  LE ROM:  Active ROM Right EVAL Left Eval Left  02/23/22  Hip flexion     Hip extension     Hip abduction     Hip adduction     Hip internal rotation     Hip external rotation     Knee flexion     Knee extension     Ankle dorsiflexion 10 5 6   Ankle plantarflexion 49 39   Ankle inversion 35 31 35  Ankle eversion 10 10 14     (Blank rows = not tested)  LE MMT:  MMT Right Eval Left Eval  Hip flexion    Hip extension    Hip abduction    Hip  adduction    Hip internal rotation    Hip external rotation    Knee flexion    Knee extension    Ankle dorsiflexion  5/5  Ankle plantarflexion    Ankle inversion  5/5  Ankle eversion  5/5   (Blank rows = not tested)   FUNCTIONAL TESTS:  Heel raise: requires heavy UE support bilaterally with bilateral LE to complete   GAIT: Distance walked: 100 Assistive device utilized: None Level of assistance: Complete Independence Comments: unsteady first couple steps, foot ER LLE    TODAY'S TREATMENT: 03/02/22: Heel raises on 4in step 15x 5"  Toe raises 15x with 1 HHA   Knee drive on 87OM step 76H 10" for dorsiflexion   SLS Lt 28", Rt 37   Tandem stance on foam 2x 30"   7in reciprocal pattern 5RT   Slant board 3x 30"   BAPS  L5       Ionto patch, encouraged for 14 hours and observe skin integrity following.     02/23/22:   Heel and toe raises 15x each incline slope   Steps reciprocal pattern 3RT 7in   Plantar flexion stretch on bottom step 3x 30"    SLS Lt 27", Rt 38"   Vector stance 2x 5"   Gastroc stretch against wall 3x 30"   BAPS L3    Ionto patch, encouraged for 14 hours and observe skin integrity following.    02/10/22: Seated: Reviewed seated HEP BAPS L3 Standing: Heel and toe raises 15x each SLS Rt 34" Lt 21" Slant board  Ionto patch, encouraged to keep for 14 hours and observe skin integrity following.  01/26/22 Calf stretch 3x 20 second holds Ankle pumps 1x 20 Heel/TR seated 1x 20  Ankle edema massage   PATIENT EDUCATION:  Education details: Patient educated on exam findings, POC, scope of PT, HEP, and ankle anatomy, ionto/dex, massage to ankle . Person educated: Patient Education method: Explanation, Demonstration, and Handouts Education comprehension: verbalized understanding, returned demonstration, verbal cues required, and tactile cues required    HOME EXERCISE PROGRAM: 4/26/23Access Code: [email or web address] Exercises - Seated Calf Towel Stretch  - 3 x  daily - 7 x weekly - 3 reps - 20-30 second hold - Seated Ankle Pumps  - 3 x daily - 7 x weekly - 1 sets - 20 reps - Seated Heel Toe Raises  - 3 x daily - 7 x weekly - 2 sets - 10 reps  5/11: Standing heel/toe raises and SLS 02/23/22: Gastroc stretch again wall and plantar fascia on step ASSESSMENT:  CLINICAL IMPRESSION: Added knee drives to improve dorsiflexion.  Added dynamic surface with balance activities with good stability. Able to increase L5 with BAPS for mobility.  Reports of improved tolerance with reciprocal pattern stairs.  Pain reduced to 2/10 at EOS.  EOS with ionto, encouraged pt to assess skin integrity upon removal.   OBJECTIVE IMPAIRMENTS Abnormal gait, decreased activity tolerance, decreased balance, decreased endurance, decreased mobility, difficulty walking, decreased ROM, decreased strength, hypomobility, increased muscle spasms, impaired flexibility, improper body mechanics, and pain.   ACTIVITY LIMITATIONS cleaning, community activity, meal prep, laundry, yard work, and shopping.   PERSONAL FACTORS Fitness and Time since onset of injury/illness/exacerbation are also affecting patient's functional outcome.    REHAB POTENTIAL: Good  CLINICAL DECISION MAKING: Stable/uncomplicated  EVALUATION COMPLEXITY: Low   GOALS: Goals reviewed with patient? No  SHORT TERM GOALS: Target date: 02/09/2022  Patient will be independent with HEP in order to improve functional outcomes. Baseline: 02/23/22:  Reports compliance with HEP daily Goal status: MET  2.  Patient will report at least 25% improvement in symptoms for improved quality of life. Baseline: 02/23/22: 40% improvements Goal status: Met  3.  Patient will demonstrate at least 10 degrees L ankle DF for improved gait mechanics Baseline: 02/23/22: Lt foot DF at 6 degrees Goal status: Ongoing    LONG TERM GOALS: Target date:02/23/2022  Patient will report at least 75% improvement in symptoms for improved quality of  life. Baseline: 02/23/22: 40% improvements Goal status: Ongoing  2.  Patient will be able to return to all activities unrestricted for improved ability to perform work functions and participate with family.  Baseline:  02/23/22:  Continues to be limited by pain Goal status: Ongoing  3.  Patient will be able to navigate stairs with reciprocal pattern without compensation or pain no greater than 2/10 in order  to demonstrate improved LE strength.  Baseline: 02/23/22:  Able to navigate stairs with reciprocal pattern with increased pain to 7/10 Goal status: Ongoing       PLAN: PT FREQUENCY: 2x/week  PT DURATION: 4 weeks  PLANNED INTERVENTIONS: Therapeutic exercises, Therapeutic activity, Neuromuscular re-education, Balance training, Gait training, Patient/Family education, Joint manipulation, Joint mobilization, Stair training, Orthotic/Fit training, DME instructions, Aquatic Therapy, Dry Needling, Electrical stimulation, Spinal manipulation, Spinal mobilization, Cryotherapy, Moist heat, Compression bandaging, scar mobilization, Splintting, Taping, Traction, Ultrasound, Ionotophoresis 5m/ml Dexamethasone, and Manual therapy   PLAN FOR NEXT SESSION: ankle mobility, eccentric gastoc/soleus strength, dex/ionto, add standing calf stretch next session.  CIhor Austin LPTA/CLT; CBIS [phone] Lento PTA 03/02/2022, 5:27 PM

## 2022-03-08 ENCOUNTER — Encounter (HOSPITAL_COMMUNITY): Payer: Self-pay

## 2022-03-08 ENCOUNTER — Ambulatory Visit (HOSPITAL_COMMUNITY): Payer: Medicare Other | Attending: Orthopedic Surgery

## 2022-03-08 DIAGNOSIS — R29898 Other symptoms and signs involving the musculoskeletal system: Secondary | ICD-10-CM | POA: Diagnosis present

## 2022-03-08 DIAGNOSIS — M25572 Pain in left ankle and joints of left foot: Secondary | ICD-10-CM | POA: Insufficient documentation

## 2022-03-08 NOTE — Therapy (Signed)
OUTPATIENT PHYSICAL THERAPY LOWER EXTREMITY TREATMENT   Patient Name: Russell Padilla MRN: [id number] DOB:1956-05-10, 66 y.o., male Today's Date: 03/08/2022   PT End of Session - 03/08/22 1715     Visit Number 5    Number of Visits 16    Date for PT Re-Evaluation 03/23/22    Authorization Type U[employer] Medicare (no auth, no VL)    PT Start Time 1705    Activity Tolerance Patient tolerated treatment well    Behavior During Therapy [facility] for tasks assessed/performed                 Past Medical History:  Diagnosis Date   Abdominal pain 07-11-13   ongoing and unspecified   Arthritis    Bladder stone 07-11-13   surgery planned   CAD (coronary artery disease)    a. 12/2012 Cath/PCI: DES to superior branch of OM2;  b. Repeat cath 4/14 with 20% LAD, patent stent OM3, 70% diffuse stenosis small, non-dominant RCA.   Essential hypertension    Headache    HLD (hyperlipidemia)    Renal disorder    renal calculi   Stomach pain    chronic   Past Surgical History:  Procedure Laterality Date   BACK SURGERY     2 cervical/ 2 lumbar fusions   CARDIAC CATHETERIZATION N/A 03/04/2015   Procedure: Left Heart Cath and Coronary Angiography;  Surgeon: Burnell Blanks, MD;  Location: [place] CV LAB;  Service: Cardiovascular;  Laterality: N/A;   CHOLECYSTECTOMY     laparoscopic.   CORONARY STENT PLACEMENT     3'14   ESOPHAGOGASTRODUODENOSCOPY ENDOSCOPY     multiple times-[facility]/ and now being followed by Endo- [facility]   KNEE ARTHROSCOPY Left    '80's   LEFT HEART CATHETERIZATION WITH CORONARY ANGIOGRAM N/A 12/05/2012   Procedure: LEFT HEART CATHETERIZATION WITH CORONARY ANGIOGRAM;  Surgeon: Birdie Riddle, MD;  Location: [place] CATH LAB;  Service: Cardiovascular;  Laterality: N/A;   LEFT HEART CATHETERIZATION WITH CORONARY ANGIOGRAM N/A 01/17/2013   Procedure: LEFT HEART CATHETERIZATION WITH CORONARY ANGIOGRAM;  Surgeon: Wellington Hampshire, MD;  Location: [place] CATH LAB;  Service:  Cardiovascular;  Laterality: N/A;   PERCUTANEOUS CORONARY STENT INTERVENTION (PCI-S) N/A 12/06/2012   Procedure: PERCUTANEOUS CORONARY STENT INTERVENTION (PCI-S);  Surgeon: Burnell Blanks, MD;  Location: [facility] CATH LAB;  Service: Cardiovascular;  Laterality: N/A;   Patient Active Problem List   Diagnosis Date Noted   Right lumbar radiculopathy 03/01/2016   Chronic headache 03/01/2016   Frequent headaches 08/11/2015   Chronic neck pain 08/11/2015   Chronic low back pain 08/11/2015   Coronary artery disease involving native coronary artery of native heart without angina pectoris 04/13/2015   Abnormal stress test    Pain of upper abdomen 02/16/2015   Change in blood platelet count 02/16/2015   Essential hypertension    HLD (hyperlipidemia)    Benign prostatic hypertrophy without urinary obstruction 12/22/2014   Decreased libido 12/22/2014   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   Dyspnea 12/31/2013   Left elbow pain 11/29/2013   Chest pain, non-cardiac 10/16/2013   Chest pain 01/16/2013   Bradycardia 01/16/2013   Chest pain at rest 12/05/2012   Achalasia 07/21/2011   HEMORRHOIDS 11/02/2010   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   CONSTIPATION 11/02/2010   DYSPHAGIA 11/02/2010   FLATULENCE-GAS-BLOATING 11/02/2010   GERD 08/10/2009   ABDOMINAL PAIN RIGHT UPPER QUADRANT 08/10/2009   Nonspecific (abnormal) findings on radiological and other examination of biliary tract 08/10/2009   PERSONAL HX  COLONIC POLYPS 08/10/2009   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  Foot pain scale 6-7/10.  Reports difficulty sleeping last night, wife was up all night.  Ionto helped some.   Pt stated the ionto patches help with pain.  Current pain scale 1-2/10.  Continues to have pain in medial aspect of heel with certain movements.    PERTINENT HISTORY: Chronic back pain  PAIN:  Are you having pain? Yes: NPRS scale: 1/10 Pain location: L heel Pain description: sharp Aggravating factors: pressure, movement Relieving factors: rest  PRECAUTIONS: None  WEIGHT BEARING RESTRICTIONS No  FALLS:  Has patient fallen in last 6 months? No  LIVING ENVIRONMENT: Lives with: lives with their spouse Lives in: House/apartment Stairs: Yes: External: 12-13 steps; on right going up and on left going up Has following equipment at home: None  OCCUPATION: Retired  PLOF: Independent  PATIENT GOALS decrease pain   OBJECTIVE:   DIAGNOSTIC FINDINGS: XR 08/11/21 IMPRESSION: No recent fracture is seen in the left ankle. 3 mm smooth marginated calcification adjacent to the medial malleolus may be residual from previous injury. Small plantar spur is seen in calcaneus. Bony spurs seen in the dorsal aspect of talonavicular joint.  PATIENT SURVEYS:  FOTO complete next session  COGNITION:  Overall cognitive status: Within functional limits for tasks assessed     SENSATION: WFL   PALPATION: TTP distal achilles tendon above insertion, posterior inferior to medial malleolus just above calcaneous, Medial and lateral gastroc, TTP L retrocalcaneal bursae  LE ROM:  Active ROM Right EVAL Left Eval Left  02/23/22  Hip flexion     Hip extension     Hip abduction     Hip adduction     Hip internal rotation     Hip external rotation     Knee flexion     Knee extension     Ankle dorsiflexion _0 Ankle plantarflexion 49 39   Ankle inversion 35 31 35  Ankle eversion  _1 (Blank rows = not tested)  LE MMT:  MMT Right Eval Left Eval  Hip flexion    Hip extension    Hip abduction    Hip adduction    Hip internal rotation    Hip external rotation    Knee flexion    Knee extension    Ankle dorsiflexion  5/5  Ankle plantarflexion    Ankle inversion  5/5  Ankle eversion  5/5   (Blank rows = not tested)   FUNCTIONAL TESTS:  Heel raise: requires heavy UE support bilaterally with bilateral LE to complete   GAIT: Distance walked: 100 Assistive device utilized: None Level of assistance: Complete Independence Comments: unsteady first couple steps, foot ER LLE    TODAY'S TREATMENT: 03/08/22: Heel and toe raises incline slope 20x 5"   Knee drive on 91YN step 82N 10" for dorsiflexion   Forward Step up training Lt LE 15x 6in step   Lateral step up Lt LE 15x  6in step Forward step down 15x 6in 1 HHA Slant board 3x 30" Tandem stance on foam 2x 30"  Ionto patch, encouraged to leave on for 14 hours and observe skin integrity following.  03/02/22: Heel raises on 4in step 15x 5"  Toe raises 15x with 1 HHA   Knee drive on 09NA step 35T 10" for dorsiflexion   SLS Lt 28", Rt 37   Tandem stance on foam 2x 30"   7in reciprocal pattern 5RT   Slant board 3x 30"   BAPS L5       Ionto patch, encouraged for 14 hours and observe skin integrity following.     02/23/22:   Heel and toe raises 15x each incline slope   Steps reciprocal pattern 3RT 7in   Plantar flexion stretch on bottom step 3x 30"    SLS Lt 27", Rt 38"   Vector stance 2x 5"   Gastroc stretch against wall 3x 30"   BAPS L3    Ionto patch, encouraged for 14 hours and observe skin integrity following.    02/10/22: Seated: Reviewed seated HEP BAPS L3 Standing: Heel and toe raises 15x each SLS Rt 34" Lt 21" Slant board  Ionto patch, encouraged to keep for 14 hours and observe skin integrity following.  01/26/22 Calf stretch 3x 20 second holds Ankle pumps 1x 20 Heel/TR  seated 1x 20  Ankle edema massage   PATIENT EDUCATION:  Education details: Patient educated on exam findings, POC, scope of PT, HEP, and ankle anatomy, ionto/dex, massage to ankle . Person educated: Patient Education method: Explanation, Demonstration, and Handouts Education comprehension: verbalized understanding, returned demonstration, verbal cues required, and tactile cues required    HOME EXERCISE PROGRAM: 4/26/23Access Code: [email or web address] Exercises - Seated Calf Towel Stretch  - 3 x daily - 7 x weekly - 3 reps - 20-30 second hold - Seated Ankle Pumps  - 3 x daily - 7 x weekly - 1 sets - 20 reps - Seated Heel Toe Raises  - 3 x daily - 7 x weekly - 2 sets - 10 reps  5/11: Standing heel/toe raises and SLS 02/23/22: Gastroc stretch again wall and plantar fascia on step ASSESSMENT:  CLINICAL IMPRESSION: Progress functional strengthening with additional step up training, reports of minimal pain with Lt knee and ankle.  Continued mobility stretches. EOS with ionto, encouraged to leave on 14 hours then observe skin integrity following removal of patch.   OBJECTIVE IMPAIRMENTS Abnormal gait, decreased activity tolerance, decreased balance, decreased endurance, decreased mobility, difficulty walking, decreased ROM, decreased strength, hypomobility, increased muscle spasms, impaired flexibility, improper body mechanics, and pain.   ACTIVITY LIMITATIONS cleaning, community activity, meal prep, laundry, yard work, and shopping.   PERSONAL FACTORS Fitness and Time since onset of injury/illness/exacerbation are also affecting patient's functional outcome.    REHAB POTENTIAL: Good  CLINICAL DECISION MAKING: Stable/uncomplicated  EVALUATION COMPLEXITY: Low   GOALS: Goals reviewed with patient? No  SHORT TERM GOALS: Target date: 02/09/2022  Patient will be independent with HEP in order to improve functional outcomes. Baseline: 02/23/22:  Reports compliance with HEP daily Goal status:  MET  2.  Patient will report at least 25% improvement in symptoms for improved quality of life. Baseline: 02/23/22: 40% improvements Goal status: Met  3.  Patient will demonstrate at least 10 degrees L ankle DF for improved gait mechanics Baseline: 02/23/22: Lt foot DF at 6 degrees Goal status: Ongoing    LONG TERM GOALS: Target date:02/23/2022  Patient will report at least  75% improvement in symptoms for improved quality of life. Baseline: 02/23/22: 40% improvements Goal status: Ongoing  2.  Patient will be able to return to all activities unrestricted for improved ability to perform work functions and participate with family.  Baseline:  02/23/22:  Continues to be limited by pain Goal status: Ongoing  3.  Patient will be able to navigate stairs with reciprocal pattern without compensation or pain no greater than 2/10 in order to demonstrate improved LE strength.  Baseline: 02/23/22:  Able to navigate stairs with reciprocal pattern with increased pain to 7/10 Goal status: Ongoing       PLAN: PT FREQUENCY: 2x/week  PT DURATION: 4 weeks  PLANNED INTERVENTIONS: Therapeutic exercises, Therapeutic activity, Neuromuscular re-education, Balance training, Gait training, Patient/Family education, Joint manipulation, Joint mobilization, Stair training, Orthotic/Fit training, DME instructions, Aquatic Therapy, Dry Needling, Electrical stimulation, Spinal manipulation, Spinal mobilization, Cryotherapy, Moist heat, Compression bandaging, scar mobilization, Splintting, Taping, Traction, Ultrasound, Ionotophoresis 64m/ml Dexamethasone, and Manual therapy   PLAN FOR NEXT SESSION: ankle mobility, eccentric gastoc/soleus strength, dex/ionto.  Begin 2 up and 1 down next session for eccentric strengthening.   CIhor Austin LPTA/CLT; CBIS [phone] Lento PTA 03/08/2022, 5:17 PM

## 2022-03-10 ENCOUNTER — Ambulatory Visit (HOSPITAL_COMMUNITY): Payer: Medicare Other | Attending: Family Medicine

## 2022-03-10 ENCOUNTER — Encounter (HOSPITAL_COMMUNITY): Payer: Self-pay

## 2022-03-10 DIAGNOSIS — M25572 Pain in left ankle and joints of left foot: Secondary | ICD-10-CM | POA: Insufficient documentation

## 2022-03-10 DIAGNOSIS — R29898 Other symptoms and signs involving the musculoskeletal system: Secondary | ICD-10-CM | POA: Diagnosis present

## 2022-03-10 NOTE — Therapy (Signed)
OUTPATIENT PHYSICAL THERAPY LOWER EXTREMITY TREATMENT   Patient Name: Russell Padilla MRN: [id number] DOB:March 31, 1956, 66 y.o., male Today's Date: 03/10/2022  END OF SESSION:   Visit #: 6 # of visits: 16 Date of re-eval: 03/23/22 Authorization: [employer] medicare Start time: 1630 Stop time: 1700 PT time calculation: 30 Behavior: PT tolerated treatment well/WFL        Past Medical History:  Diagnosis Date   Abdominal pain 07-11-13   ongoing and unspecified   Arthritis    Bladder stone 07-11-13   surgery planned   CAD (coronary artery disease)    a. 12/2012 Cath/PCI: DES to superior branch of OM2;  b. Repeat cath 4/14 with 20% LAD, patent stent OM3, 70% diffuse stenosis small, non-dominant RCA.   Essential hypertension    Headache    HLD (hyperlipidemia)    Renal disorder    renal calculi   Stomach pain    chronic   Past Surgical History:  Procedure Laterality Date   BACK SURGERY     2 cervical/ 2 lumbar fusions   CARDIAC CATHETERIZATION N/A 03/04/2015   Procedure: Left Heart Cath and Coronary Angiography;  Surgeon: Burnell Blanks, MD;  Location: [place] CV LAB;  Service: Cardiovascular;  Laterality: N/A;   CHOLECYSTECTOMY     laparoscopic.   CORONARY STENT PLACEMENT     3'14   ESOPHAGOGASTRODUODENOSCOPY ENDOSCOPY     multiple times-[facility]/ and now being followed by Endo- [facility]   KNEE ARTHROSCOPY Left    '80's   LEFT HEART CATHETERIZATION WITH CORONARY ANGIOGRAM N/A 12/05/2012   Procedure: LEFT HEART CATHETERIZATION WITH CORONARY ANGIOGRAM;  Surgeon: Birdie Riddle, MD;  Location: [place] CATH LAB;  Service: Cardiovascular;  Laterality: N/A;   LEFT HEART CATHETERIZATION WITH CORONARY ANGIOGRAM N/A 01/17/2013   Procedure: LEFT HEART CATHETERIZATION WITH CORONARY ANGIOGRAM;  Surgeon: Wellington Hampshire, MD;  Location: [place] CATH LAB;  Service: Cardiovascular;  Laterality: N/A;   PERCUTANEOUS CORONARY STENT INTERVENTION (PCI-S) N/A 12/06/2012   Procedure: PERCUTANEOUS  CORONARY STENT INTERVENTION (PCI-S);  Surgeon: Burnell Blanks, MD;  Location: [facility] C[facility] LAB;  Service: Cardiovascular;  Laterality: N/A;   Patient Active Problem List   Diagnosis Date Noted   Right lumbar radiculopathy 03/01/2016   Chronic headache 03/01/2016   Frequent headaches 08/11/2015   Chronic neck pain 08/11/2015   Chronic low back pain 08/11/2015   Coronary artery disease involving native coronary artery of native heart without angina pectoris 04/13/2015   Abnormal stress test    Pain of upper abdomen 02/16/2015   Change in blood platelet count 02/16/2015   Essential hypertension    HLD (hyperlipidemia)    Benign prostatic hypertrophy without urinary obstruction 12/22/2014   Decreased libido 12/22/2014   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   Dyspnea 12/31/2013   Left elbow pain 11/29/2013   Chest pain, non-cardiac 10/16/2013   Chest pain 01/16/2013   Bradycardia 01/16/2013   Chest pain at rest 12/05/2012   Achalasia 07/21/2011   HEMORRHOIDS 11/02/2010   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   CONSTIPATION 11/02/2010   DYSPHAGIA 11/02/2010   FLATULENCE-GAS-BLOATING 11/02/2010   GERD 08/10/2009   ABDOMINAL PAIN RIGHT UPPER QUADRANT 08/10/2009   Nonspecific (abnormal) findings on radiological and other examination of biliary tract 08/10/2009   PERSONAL HX COLONIC POLYPS 08/10/2009   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  ONSET DATE: 5-6 months  SUBJECTIVE:   SUBJECTIVE STATEMENT: Reports he felt good when removed ionto patch yesterday morning, he feels this helps more than exercise.    PERTINENT HISTORY: Chronic back pain  PAIN:  Are you having pain?  Yes: NPRS scale: 2/10 Pain location: L heel Pain description: sharp Aggravating factors: pressure, movement Relieving factors: rest  PRECAUTIONS: None  WEIGHT BEARING RESTRICTIONS No  FALLS:  Has patient fallen in last 6 months? No  LIVING ENVIRONMENT: Lives with: lives with their spouse Lives in: House/apartment Stairs: Yes: External: 12-13 steps; on right going up and on left going up Has following equipment at home: None  OCCUPATION: Retired  PLOF: Independent  PATIENT GOALS decrease pain   OBJECTIVE:   DIAGNOSTIC FINDINGS: XR 08/11/21 IMPRESSION: No recent fracture is seen in the left ankle. 3 mm smooth marginated calcification adjacent to the medial malleolus may be residual from previous injury. Small plantar spur is seen in calcaneus. Bony spurs seen in the dorsal aspect of talonavicular joint.  PATIENT SURVEYS:  FOTO complete next session  COGNITION:  Overall cognitive status: Within functional limits for tasks assessed     SENSATION: WFL   PALPATION: TTP distal achilles tendon above insertion, posterior inferior to medial malleolus just above calcaneous, Medial and lateral gastroc, TTP L retrocalcaneal bursae  LE ROM:  Active ROM Right EVAL Left Eval Left  02/23/22  Hip flexion     Hip extension     Hip abduction     Hip adduction     Hip internal rotation     Hip external rotation     Knee flexion     Knee extension     Ankle dorsiflexion 10 5 6   Ankle plantarflexion 49 39   Ankle inversion 35 31 35  Ankle eversion 10 10 14     (Blank rows = not tested)  LE MMT:  MMT Right Eval Left Eval  Hip flexion    Hip extension    Hip abduction    Hip adduction    Hip internal rotation    Hip external rotation    Knee flexion    Knee extension    Ankle dorsiflexion  5/5  Ankle plantarflexion    Ankle inversion  5/5  Ankle eversion  5/5   (Blank rows = not tested)   FUNCTIONAL TESTS:  Heel raise: requires heavy UE support bilaterally  with bilateral LE to complete   GAIT: Distance walked: 100 Assistive device utilized: None Level of assistance: Complete Independence Comments: unsteady first couple steps, foot ER LLE    TODAY'S TREATMENT: 03/10/22:  Slow lowering heel slides BLE 15x Incline slope On 7in Step eccentric control BLE 15x 2 up and 1 down each foot 10x 3-5" holds Reciprocal 7in step height 7RT  Slant board 3x 30" Leg press plantarflexion 3x 10 5" holds  Ionto patch, encouraged to leave on for 14 hours and observe skin integrity following.  03/08/22: Heel and toe raises incline slope 20x 5"   Knee drive on 50DT step 26Z 10" for dorsiflexion   Forward Step up training Lt LE 15x 6in step   Lateral step up Lt LE 15x 6in step Forward step down 15x 6in 1 HHA Slant board 3x 30" Tandem stance on foam 2x 30"  Ionto patch, encouraged to leave on for 14 hours and observe skin integrity following.  03/02/22: Heel  raises on 4in step 15x 5"  Toe raises 15x with 1 HHA   Knee drive on 00FH step 21F 10" for dorsiflexion   SLS Lt 28", Rt 37   Tandem stance on foam 2x 30"   7in reciprocal pattern 5RT   Slant board 3x 30"   BAPS L5       Ionto patch, encouraged for 14 hours and observe skin integrity following.     02/23/22:   Heel and toe raises 15x each incline slope   Steps reciprocal pattern 3RT 7in   Plantar flexion stretch on bottom step 3x 30"    SLS Lt 27", Rt 38"   Vector stance 2x 5"   Gastroc stretch against wall 3x 30"   BAPS L3    Ionto patch, encouraged for 14 hours and observe skin integrity following.    02/10/22: Seated: Reviewed seated HEP BAPS L3 Standing: Heel and toe raises 15x each SLS Rt 34" Lt 21" Slant board  Ionto patch, encouraged to keep for 14 hours and observe skin integrity following.  01/26/22 Calf stretch 3x 20 second holds Ankle pumps 1x 20 Heel/TR seated 1x 20  Ankle edema massage   PATIENT EDUCATION:  Education details: Patient educated on exam findings,  POC, scope of PT, HEP, and ankle anatomy, ionto/dex, massage to ankle . Person educated: Patient Education method: Explanation, Demonstration, and Handouts Education comprehension: verbalized understanding, returned demonstration, verbal cues required, and tactile cues required    HOME EXERCISE PROGRAM: 4/26/23Access Code: [email or web address] Exercises - Seated Calf Towel Stretch  - 3 x daily - 7 x weekly - 3 reps - 20-30 second hold - Seated Ankle Pumps  - 3 x daily - 7 x weekly - 1 sets - 20 reps - Seated Heel Toe Raises  - 3 x daily - 7 x weekly - 2 sets - 10 reps  5/11: Standing heel/toe raises and SLS 02/23/22: Gastroc stretch again wall and plantar fascia on step ASSESSMENT:  CLINICAL IMPRESSION: Progressed eccentric control plantar flexion with 2 up and 1 down, pt presents with increased weakness Lt LE compared to Rt.  EOS with 6th ionto patch, plan to discuss continued ionto vs hold.  Added single leg heel raises to HEP with printout given.  Pt reports pain minimal at EOS.   OBJECTIVE IMPAIRMENTS Abnormal gait, decreased activity tolerance, decreased balance, decreased endurance, decreased mobility, difficulty walking, decreased ROM, decreased strength, hypomobility, increased muscle spasms, impaired flexibility, improper body mechanics, and pain.   ACTIVITY LIMITATIONS cleaning, community activity, meal prep, laundry, yard work, and shopping.   PERSONAL FACTORS Fitness and Time since onset of injury/illness/exacerbation are also affecting patient's functional outcome.    REHAB POTENTIAL: Good  CLINICAL DECISION MAKING: Stable/uncomplicated  EVALUATION COMPLEXITY: Low   GOALS: Goals reviewed with patient? No  SHORT TERM GOALS: Target date: 02/09/2022  Patient will be independent with HEP in order to improve functional outcomes. Baseline: 02/23/22:  Reports compliance with HEP daily Goal status: MET  2.  Patient will report at least 25% improvement in symptoms for improved  quality of life. Baseline: 02/23/22: 40% improvements Goal status: Met  3.  Patient will demonstrate at least 10 degrees L ankle DF for improved gait mechanics Baseline: 02/23/22: Lt foot DF at 6 degrees Goal status: Ongoing    LONG TERM GOALS: Target date:02/23/2022  Patient will report at least 75% improvement in symptoms for improved quality of life. Baseline: 02/23/22: 40% improvements Goal status: Ongoing  2.  Patient will be able  to return to all activities unrestricted for improved ability to perform work functions and participate with family.  Baseline:  02/23/22:  Continues to be limited by pain Goal status: Ongoing  3.  Patient will be able to navigate stairs with reciprocal pattern without compensation or pain no greater than 2/10 in order to demonstrate improved LE strength.  Baseline: 02/23/22:  Able to navigate stairs with reciprocal pattern with increased pain to 7/10 Goal status: Ongoing       PLAN: PT FREQUENCY: 2x/week  PT DURATION: 4 weeks  PLANNED INTERVENTIONS: Therapeutic exercises, Therapeutic activity, Neuromuscular re-education, Balance training, Gait training, Patient/Family education, Joint manipulation, Joint mobilization, Stair training, Orthotic/Fit training, DME instructions, Aquatic Therapy, Dry Needling, Electrical stimulation, Spinal manipulation, Spinal mobilization, Cryotherapy, Moist heat, Compression bandaging, scar mobilization, Splintting, Taping, Traction, Ultrasound, Ionotophoresis 3m/ml Dexamethasone, and Manual therapy   PLAN FOR NEXT SESSION: ankle mobility, eccentric gastoc/soleus strength, dex/ionto.  Begin 2 up and 1 down next session for eccentric strengthening.   CIhor Austin LPTA/CLT; CBIS [phone] Lento PTA 03/10/2022, 4:28 PM

## 2022-03-15 ENCOUNTER — Encounter (HOSPITAL_COMMUNITY): Payer: Medicare Other

## 2022-03-17 ENCOUNTER — Encounter (HOSPITAL_COMMUNITY): Payer: Medicare Other

## 2022-03-22 ENCOUNTER — Telehealth (HOSPITAL_COMMUNITY): Payer: Self-pay

## 2022-03-22 ENCOUNTER — Encounter (HOSPITAL_COMMUNITY): Payer: Medicare Other

## 2022-03-22 NOTE — Telephone Encounter (Signed)
Received call to call back, pt arrived to discuss with therapist.  Pt with increased Lt hip and back pain following mowing.  Discussed continue vs DC, pt wishes to hold on therapy until back/leg pain is resolved.  Pt encourage to contact referring MD when wishes to resume therapy.  Will be discharged today.  Ihor Austin, LPTA/CLT; Delana Meyer [phone]

## 2022-03-23 ENCOUNTER — Encounter (HOSPITAL_COMMUNITY): Payer: Self-pay | Admitting: Physical Therapy

## 2022-03-23 NOTE — Therapy (Signed)
[place], [place], [address] Phone: [phone]   Fax:  [phone]  Patient Details  Name: VIOLET CART MRN: [id number] Date of Birth: 10/01/1956 Referring Provider:  No ref. provider found  Encounter Date: 03/23/2022  PHYSICAL THERAPY DISCHARGE SUMMARY  Visits from Start of Care: 6  Current functional level related to goals / functional outcomes: Patient with continued symptoms. Per PTA encounter, on 03/22/22: Received call to call back, pt arrived to discuss with therapist.  Pt with increased Lt hip and back pain following mowing.  Discussed continue vs DC, pt wishes to hold on therapy until back/leg pain is resolved.  Pt encourage to contact referring MD when wishes to resume therapy.  Will be discharged today.   Remaining deficits: Pain, ROM   Education / Equipment: HEP   Patient agrees to discharge. Patient goals were not met. Patient is being discharged due to the patient's request.    Mearl Latin, PT 03/23/2022, 10:35 AM  [place], [place], [address] Phone: [phone]   Fax:  [phone]

## 2022-03-24 ENCOUNTER — Encounter (HOSPITAL_COMMUNITY): Payer: Medicare Other

## 2022-07-25 ENCOUNTER — Telehealth: Payer: Self-pay | Admitting: Gastroenterology

## 2022-07-25 NOTE — Telephone Encounter (Signed)
Hi Dr. Fuller Plan,   Patient called states he has previously seen you and was referred to [facility] stated the provider there is not able to proceed with his care and he would like to continue his care with you specifically. He said he desperately needs an EGD or something to help him with his throat. Records are available in Epic and Care Everywhere for you to review and advise on scheduling.   Thanks

## 2022-07-26 NOTE — Telephone Encounter (Signed)
Request received to transfer GI care to [facility].  We appreciate the interest in our practice however, due to capacity, we cannot accommodate this transfer.

## 2022-08-11 ENCOUNTER — Other Ambulatory Visit (HOSPITAL_COMMUNITY): Payer: Self-pay | Admitting: Family Medicine

## 2022-08-11 ENCOUNTER — Ambulatory Visit (HOSPITAL_COMMUNITY)
Admission: RE | Admit: 2022-08-11 | Discharge: 2022-08-11 | Disposition: A | Discharge status: Home / Self Care | Payer: Medicare Other | Source: Ambulatory Visit | Attending: Family Medicine | Admitting: Family Medicine

## 2022-08-11 DIAGNOSIS — R1310 Dysphagia, unspecified: Secondary | ICD-10-CM | POA: Insufficient documentation

## 2022-08-16 NOTE — Telephone Encounter (Signed)
Patient is aware of your recommendation. Thank you

## 2022-09-20 ENCOUNTER — Telehealth: Payer: Self-pay | Admitting: *Deleted

## 2022-09-20 ENCOUNTER — Other Ambulatory Visit (HOSPITAL_COMMUNITY): Payer: Self-pay | Admitting: Internal Medicine

## 2022-09-20 DIAGNOSIS — R1319 Other dysphagia: Secondary | ICD-10-CM

## 2022-09-20 DIAGNOSIS — K224 Dyskinesia of esophagus: Secondary | ICD-10-CM

## 2022-09-20 DIAGNOSIS — R1312 Dysphagia, oropharyngeal phase: Secondary | ICD-10-CM

## 2022-09-20 NOTE — Telephone Encounter (Signed)
   Name: ELDRICK PENICK  DOB: 22-Aug-1956  MRN: [id number]  Primary Cardiologist: Lauree Chandler, MD  Chart reviewed as part of pre-operative protocol coverage. Because of Bronislaus Verdell Cimo's past medical history and time since last visit, he will require a follow-up in-office visit in order to better assess preoperative cardiovascular risk.  Pre-op covering staff: - Please schedule appointment and call patient to inform them. If patient already had an upcoming appointment within acceptable timeframe, please add "pre-op clearance" to the appointment notes so provider is aware. - Please contact requesting surgeon's office via preferred method (i.e, phone, fax) to inform them of need for appointment prior to surgery.  Lenna Sciara, NP  09/20/2022, 12:05 PM

## 2022-09-20 NOTE — Telephone Encounter (Signed)
Pt has been scheduled for in office appt in [place], due to no availability in [place] before his procedure 10/05/22. Pt is grateful for the help so he can have his procedure. I will update all parties involved in this matter.

## 2022-09-20 NOTE — Telephone Encounter (Signed)
   Pre-operative Risk Assessment    Patient Name: Russell Padilla  DOB: [birth year] MRN: [id number]      Request for Surgical Clearance    Procedure:   ENDOSCOPY/COLONOSCOPY  Date of Surgery:  Clearance 10/05/22                                 Surgeon: NOT LISTED Surgeon's Group or Practice Name:  [facility] Phone number:  [phone] Fax number:  [phone]   Type of Clearance Requested:   - Medical ; NO MEDICATIONS LISTED AS NEEDING TO BE HELD   Type of Anesthesia:  Not Indicated (PROPOFOL?)   Additional requests/questions:    Jiles Prows   09/20/2022, 11:45 AM

## 2022-09-23 ENCOUNTER — Encounter: Payer: Self-pay | Admitting: Physician Assistant

## 2022-09-23 ENCOUNTER — Ambulatory Visit: Payer: Medicare Other | Attending: Physician Assistant | Admitting: Physician Assistant

## 2022-09-23 VITALS — BP 109/64 | HR 58 | Ht 70.5 in | Wt 239.4 lb

## 2022-09-23 DIAGNOSIS — I6523 Occlusion and stenosis of bilateral carotid arteries: Secondary | ICD-10-CM

## 2022-09-23 DIAGNOSIS — I2510 Atherosclerotic heart disease of native coronary artery without angina pectoris: Secondary | ICD-10-CM | POA: Diagnosis not present

## 2022-09-23 DIAGNOSIS — Z01818 Encounter for other preprocedural examination: Secondary | ICD-10-CM | POA: Diagnosis not present

## 2022-09-23 DIAGNOSIS — I10 Essential (primary) hypertension: Secondary | ICD-10-CM

## 2022-09-23 DIAGNOSIS — E785 Hyperlipidemia, unspecified: Secondary | ICD-10-CM | POA: Diagnosis not present

## 2022-09-23 NOTE — Progress Notes (Signed)
Office Visit    Patient Name: Russell Padilla Date of Encounter: 09/23/2022  PCP:  Sharilyn Sites, [facility]  Cardiologist:  Lauree Chandler, MD  Advanced Practice Provider:  No care team member to display Electrophysiologist:  None   HPI    Russell Padilla is a 66 y.o. male with a past medical history of hypertension, hyperlipidemia, chronic neck and shoulder pain, CAD (status post DES to [place] on 12/2012 with residual nonobstructive disease, normal LVEF on echo 2014, repeat cath for 2014 with patent stent for pulmonary and hematology workup for thrombocytopenia which was normal.  Immediate risk NST 2016 with repeat cath stable CAD, CTO of OM1 with left-to-right collaterals, mild LAD stenosis.  Mild carotid disease.  Presents today for follow-up appointment and preop clearance.  Patient with seen by Dr. Angelena Form 01/15/2020 was having some esophageal problems.  Carotid Dopplers ordered and had mild 1 to 39% right and left ICAs.  He was seen in follow-up 08/30/2021 and endorsed some burning in his chest while working in the yard.  Worked with a chainsaw the week prior for about 6 hours and had no symptoms or dyspnea at that time.  Occasional ankle swelling.  Since his chronic angina was unchanged no further studies were ordered at that time.  We were working on better cholesterol control.  Today, he presents for preop clearance for his colonoscopy/endoscopy.  He has not had any recent cardiovascular issues.  He does state that in the past when he stood up quickly he would get dizzy.  This has not happened in a while.   Past Medical History    Past Medical History:  Diagnosis Date   Abdominal pain 07-11-13   ongoing and unspecified   Arthritis    Bladder stone 07-11-13   surgery planned   CAD (coronary artery disease)    a. 12/2012 Cath/PCI: DES to superior branch of OM2;  b. Repeat cath 4/14 with 20% LAD, patent stent OM3, 70% diffuse stenosis small,  non-dominant RCA.   Essential hypertension    Headache    HLD (hyperlipidemia)    Renal disorder    renal calculi   Stomach pain    chronic   Past Surgical History:  Procedure Laterality Date   BACK SURGERY     2 cervical/ 2 lumbar fusions   CARDIAC CATHETERIZATION N/A 03/04/2015   Procedure: Left Heart Cath and Coronary Angiography;  Surgeon: Burnell Blanks, MD;  Location: [facility];  Service: Cardiovascular;  Laterality: N/A;   CHOLECYSTECTOMY     laparoscopic.   CORONARY STENT PLACEMENT     3'14   ESOPHAGOGASTRODUODENOSCOPY ENDOSCOPY     multiple times-[facility]/ and now being followed by Endo- [facility]   KNEE ARTHROSCOPY Left    '80's   LEFT HEART CATHETERIZATION WITH CORONARY ANGIOGRAM N/A 12/05/2012   Procedure: LEFT HEART CATHETERIZATION WITH CORONARY ANGIOGRAM;  Surgeon: Birdie Riddle, MD;  Location: [facility];  Service: Cardiovascular;  Laterality: N/A;   LEFT HEART CATHETERIZATION WITH CORONARY ANGIOGRAM N/A 01/17/2013   Procedure: LEFT HEART CATHETERIZATION WITH CORONARY ANGIOGRAM;  Surgeon: Wellington Hampshire, MD;  Location: [facility];  Service: Cardiovascular;  Laterality: N/A;   PERCUTANEOUS CORONARY STENT INTERVENTION (PCI-S) N/A 12/06/2012   Procedure: PERCUTANEOUS CORONARY STENT INTERVENTION (PCI-S);  Surgeon: Burnell Blanks, MD;  Location: [facility] CATH LAB;  Service: Cardiovascular;  Laterality: N/A;    Allergies  Allergies  Allergen Reactions  Crestor [Rosuvastatin Calcium] Other (See Comments)    Aches    Latex Hives and Rash    When at dentist    EKGs/Labs/Other Studies Reviewed:   The following studies were reviewed today:  Carotid ultrasound 02/04/2020 Summary:  Right Carotid: Velocities in the right ICA are consistent with a 1-39%  stenosis.   Left Carotid: Velocities in the left ICA are consistent with a 1-39%  stenosis.   Vertebrals: Bilateral vertebral arteries demonstrate antegrade flow.  Subclavians: Normal flow  hemodynamics were seen in bilateral subclavian               arteries.   EKG:  EKG is not ordered today.    Recent Labs: 01/18/2022: ALT 17  Recent Lipid Panel    Component Value Date/Time   CHOL 93 (L) 01/18/2022 1437   TRIG 98 01/18/2022 1437   HDL 54 01/18/2022 1437   CHOLHDL 1.7 01/18/2022 1437   LDLCALC 20 01/18/2022 1437   Home Medications   Current Meds  Medication Sig   acetaminophen (TYLENOL) 500 MG tablet Take 1,000 mg by mouth every 8 (eight) hours as needed for moderate pain.   aspirin 81 MG EC tablet Take 81 mg by mouth daily. Swallow whole.   Biotin (SUPER BIOTIN) 5 MG TABS Take 1 tablet by mouth daily.   cholecalciferol (VITAMIN D3) 25 MCG (1000 UNIT) tablet Take 1,000 Units by mouth daily.   dicyclomine (BENTYL) 20 MG tablet Take 20 mg by mouth 4 (four) times daily as needed (IBS).    docusate sodium (COLACE) 100 MG capsule Take 100 mg by mouth daily.    Evolocumab (REPATHA SURECLICK) 027 MG/ML SOAJ Inject 140 mg into the skin every 14 (fourteen) days.   fluticasone (FLONASE) 50 MCG/ACT nasal spray Place 1 spray into both nostrils at bedtime.    gabapentin (NEURONTIN) 300 MG capsule Take 1 tablet PO in the morning and take 2 tablets PO at bedtime.   Multiple Vitamins-Minerals (MULTIVITAMIN WITH MINERALS) tablet Take 1 tablet by mouth daily.   nitroGLYCERIN (NITROSTAT) 0.4 MG SL tablet Place 1 tablet (0.4 mg total) under the tongue every 5 (five) minutes as needed for chest pain.   olmesartan (BENICAR) 40 MG tablet Take 40 mg by mouth daily.   oxyCODONE-acetaminophen (PERCOCET/ROXICET) 5-325 MG per tablet Take 1 tablet by mouth every 4 (four) hours as needed for severe pain.    pantoprazole (PROTONIX) 40 MG tablet Take 40 mg by mouth 2 (two) times daily.    senna (SENOKOT) 8.6 MG TABS Take 1-2 tablets by mouth daily.    sildenafil (VIAGRA) 100 MG tablet Take 100 mg by mouth daily as needed for erectile dysfunction.   simvastatin (ZOCOR) 40 MG tablet Take 40 mg by  mouth at bedtime.   sucralfate (CARAFATE) 1 GM/10ML suspension Take 1 g by mouth 3 (three) times daily as needed (spasms). With Lidocaine 2%  and Mylanta   traMADol (ULTRAM) 50 MG tablet Take 50 mg by mouth every 6 (six) hours as needed for moderate pain.     Review of Systems      All other systems reviewed and are otherwise negative except as noted above.  Physical Exam    VS:  BP 109/64   Pulse (!) 58   Ht 5' 10.5" (1.791 m)   Wt 239 lb 6.4 oz (108.6 kg)   SpO2 95%   BMI 33.86 kg/m  , BMI Body mass index is 33.86 kg/m.  Wt Readings from Last 3 Encounters:  09/23/22 239 lb 6.4 oz (108.6 kg)  08/30/21 234 lb (106.1 kg)  08/30/21 234 lb (106.1 kg)     GEN: Well nourished, well developed, in no acute distress. HEENT: normal. Neck: Supple, no JVD, carotid bruits, or masses. Cardiac: RRR, no murmurs, rubs, or gallops. No clubbing, cyanosis, edema.  Radials/PT 2+ and equal bilaterally.  Respiratory:  Respirations regular and unlabored, clear to auscultation bilaterally. GI: Soft, nontender, nondistended. MS: No deformity or atrophy. Skin: Warm and dry, no rash. Neuro:  Strength and sensation are intact. Psych: Normal affect.  Assessment & Plan    Preop Evaluation Mr. Wymer's perioperative risk of a major cardiac event is 0.9% according to the Revised Cardiac Risk Index (RCRI).  Therefore, he is at low risk for perioperative complications.   His functional capacity is good at 6.61 METs according to the Duke Activity Status Index (DASI). Recommendations: According to ACC/AHA guidelines, no further cardiovascular testing needed.  The patient may proceed to surgery at acceptable risk.   Antiplatelet and/or Anticoagulation Recommendations: The patient should remain on Aspirin without interruption.     CAD status post DES to OM2 on 12/2012 -no chest pains no SOB -continue current medications  -ASA does not need to be stopped for upcoming procedure per notes  Hypertension -BP  well controlled  Hyperlipidemia -LDL 19  Mild carotid stenosis on Dopplers 02/2020 -he will need updated dopplers, next week we will get these done          Disposition: Follow up 6 month with Lauree Chandler, MD or APP.  Signed, Elgie Collard, PA-C 09/23/2022, 5:17 PM [facility]

## 2022-09-23 NOTE — Patient Instructions (Signed)
Medication Instructions:  Your physician recommends that you continue on your current medications as directed. Please refer to the Current Medication list given to you today.  *If you need a refill on your cardiac medications before your next appointment, please call your pharmacy*   Lab Work: None If you have labs (blood work) drawn today and your tests are completely normal, you will receive your results only by: [place] (if you have MyChart) OR A paper copy in the mail If you have any lab test that is abnormal or we need to change your treatment, we will call you to review the results.   Testing/Procedures: Your physician has requested that you have a carotid duplex. This test is an ultrasound of the carotid arteries in your neck. It looks at blood flow through these arteries that supply the brain with blood. Allow one hour for this exam. There are no restrictions or special instructions.    Follow-Up: At [facility], you and your health needs are our priority.  As part of our continuing mission to provide you with exceptional heart care, we have created designated Provider Care Teams.  These Care Teams include your primary Cardiologist (physician) and Advanced Practice Providers (APPs -  Physician Assistants and Nurse Practitioners) who all work together to provide you with the care you need, when you need it.  We recommend signing up for the patient portal called "MyChart".  Sign up information is provided on this After Visit Summary.  MyChart is used to connect with patients for Virtual Visits (Telemedicine).  Patients are able to view lab/test results, encounter notes, upcoming appointments, etc.  Non-urgent messages can be sent to your provider as well.   To learn more about what you can do with MyChart, go to [email or web address].    Your next appointment:   6 month(s)  The format for your next appointment:   In Person  Provider:   Lauree Chandler, MD   or APP  Important Information About Sugar

## 2022-09-28 ENCOUNTER — Ambulatory Visit (HOSPITAL_COMMUNITY)
Admission: RE | Admit: 2022-09-28 | Discharge: 2022-09-28 | Disposition: A | Discharge status: Home / Self Care | Payer: Medicare Other | Source: Ambulatory Visit | Attending: Cardiovascular Disease | Admitting: Cardiovascular Disease

## 2022-09-28 DIAGNOSIS — I6523 Occlusion and stenosis of bilateral carotid arteries: Secondary | ICD-10-CM | POA: Diagnosis not present

## 2022-09-29 ENCOUNTER — Ambulatory Visit (HOSPITAL_COMMUNITY)
Admission: RE | Admit: 2022-09-29 | Discharge: 2022-09-29 | Disposition: A | Discharge status: Home / Self Care | Payer: Medicare Other | Source: Ambulatory Visit | Attending: Internal Medicine | Admitting: Internal Medicine

## 2022-09-29 DIAGNOSIS — R1312 Dysphagia, oropharyngeal phase: Secondary | ICD-10-CM | POA: Diagnosis present

## 2022-09-29 DIAGNOSIS — R1319 Other dysphagia: Secondary | ICD-10-CM | POA: Diagnosis present

## 2022-09-29 DIAGNOSIS — K224 Dyskinesia of esophagus: Secondary | ICD-10-CM | POA: Insufficient documentation

## 2022-10-04 ENCOUNTER — Telehealth: Payer: Self-pay | Admitting: Cardiovascular Disease

## 2022-10-04 NOTE — Telephone Encounter (Signed)
Vivien Rota with [facility] is calling stating that they have not received clearance for patients procedure tomorrow. She is requesting it be faxed to [phone]. If it is not received by 4 pm they will have to cancel the procedure. Please advise.

## 2022-10-04 NOTE — Telephone Encounter (Signed)
I will re-fax clearance notes from Nicholes Rough, NP 09/23/22. Clearance notes were faxed on 09/23/22.

## 2022-10-04 NOTE — Telephone Encounter (Signed)
Patient states the fax for clearance still has not been received. He requests it be faxed again to: [phone] and wants a call back when it is sent.

## 2022-10-04 NOTE — Telephone Encounter (Signed)
CLEARANCE NOTES HAVE BEEN RE-FAXED TO [phone] ATTN: TONI

## 2022-10-12 ENCOUNTER — Other Ambulatory Visit (HOSPITAL_COMMUNITY): Payer: Self-pay

## 2022-12-01 ENCOUNTER — Other Ambulatory Visit: Payer: Self-pay | Admitting: Cardiovascular Disease

## 2022-12-01 ENCOUNTER — Other Ambulatory Visit: Payer: Self-pay

## 2022-12-01 MED ORDER — REPATHA SURECLICK 140 MG/ML ~~LOC~~ SOAJ: 140.0000 mg | SUBCUTANEOUS | 11 refills | Status: DC

## 2022-12-01 NOTE — Addendum Note (Signed)
Addended by: Carter Kitten D on: 12/01/2022 10:47 AM   Modules accepted: Orders

## 2022-12-01 NOTE — Telephone Encounter (Signed)
Pt called requesting a refill on repatha. This medication has not been sent to pt's pharmacy as requested. Please address

## 2022-12-08 ENCOUNTER — Ambulatory Visit (INDEPENDENT_AMBULATORY_CARE_PROVIDER_SITE_OTHER): Payer: Medicare Other

## 2022-12-08 ENCOUNTER — Ambulatory Visit
Admission: RE | Admit: 2022-12-08 | Discharge: 2022-12-08 | Disposition: A | Discharge status: Home / Self Care | Payer: Medicare Other | Source: Ambulatory Visit | Attending: Nurse Practitioner | Admitting: Nurse Practitioner

## 2022-12-08 VITALS — BP 146/80 | HR 57 | Temp 98.0°F | Resp 18

## 2022-12-08 DIAGNOSIS — M79671 Pain in right foot: Secondary | ICD-10-CM | POA: Diagnosis not present

## 2022-12-08 NOTE — Discharge Instructions (Addendum)
The x-ray today shows spurring in your right heel.  This could explain your pain.  You could also have plantar fasciitis.  I recommended getting cushioned heel insoles to help with pain and provide support.  Also recommend following up with a Podiatrist for possible injection, to get fitted insoles, etc.   You can try the stretches I have attached.  Also rest, ice, and you can take Tylenol as needed for pain.

## 2022-12-08 NOTE — ED Provider Notes (Signed)
[facility]    CSN: [id number] Arrival date & time: 12/08/22  1456      History   Chief Complaint Chief Complaint  Patient presents with   Appointment    1500    HPI Russell Padilla is a 67 y.o. male.   Patient presents today for 2-week history of right heel pain.  He denies any recent injury, fall, accident, or trauma to his heel.  No swelling, redness, or bruising.  Reports it is worse when he walks.  No weakness in the foot or numbness/tingling in the toes that is new.  Reports he has a history of neuropathy in his feet at baseline for which she takes gabapentin.  No foot surgeries.    Past Medical History:  Diagnosis Date   Abdominal pain 07-11-13   ongoing and unspecified   Arthritis    Bladder stone 07-11-13   surgery planned   CAD (coronary artery disease)    a. 12/2012 Cath/PCI: DES to superior branch of OM2;  b. Repeat cath 4/14 with 20% LAD, patent stent OM3, 70% diffuse stenosis small, non-dominant RCA.   Essential hypertension    Headache    HLD (hyperlipidemia)    Renal disorder    renal calculi   Stomach pain    chronic    Patient Active Problem List   Diagnosis Date Noted   Right lumbar radiculopathy 03/01/2016   Chronic headache 03/01/2016   Frequent headaches 08/11/2015   Chronic neck pain 08/11/2015   Chronic low back pain 08/11/2015   Coronary artery disease involving native coronary artery of native heart without angina pectoris 04/13/2015   Abnormal stress test    Pain of upper abdomen 02/16/2015   Change in blood platelet count 02/16/2015   Essential hypertension    HLD (hyperlipidemia)    Benign prostatic hypertrophy without urinary obstruction 12/22/2014   Decreased libido 12/22/2014   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   Dyspnea 12/31/2013   Left elbow pain 11/29/2013   Chest pain, non-cardiac 10/16/2013   Chest pain 01/16/2013   Bradycardia 01/16/2013   Chest pain at rest 12/05/2012   Achalasia 07/21/2011   HEMORRHOIDS 11/02/2010    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   CONSTIPATION 11/02/2010   DYSPHAGIA 11/02/2010   FLATULENCE-GAS-BLOATING 11/02/2010   GERD 08/10/2009   ABDOMINAL PAIN RIGHT UPPER QUADRANT 08/10/2009   Nonspecific (abnormal) findings on radiological and other examination of biliary tract 08/10/2009   PERSONAL HX COLONIC POLYPS 08/10/2009   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   CORONARY STENT PLACEMENT     3'14   ESOPHAGOGASTRODUODENOSCOPY ENDOSCOPY     multiple times-[facility]/ and now being followed by Endo- [facility]   KNEE ARTHROSCOPY Left    '80's   LEFT HEART CATHETERIZATION WITH CORONARY ANGIOGRAM N/A 12/05/2012   Procedure: LEFT HEART CATHETERIZATION WITH CORONARY ANGIOGRAM;  Surgeon: Birdie Riddle, MD;  Location: [place] [facility];  Service: Cardiovascular;  Laterality: N/A;   LEFT HEART CATHETERIZATION WITH CORONARY ANGIOGRAM N/A 01/17/2013   Procedure: LEFT HEART CATHETERIZATION WITH CORONARY ANGIOGRAM;  Surgeon:  Wellington Hampshire, MD;  Location: [place] [facility];  Service: Cardiovascular;  Laterality: N/A;   PERCUTANEOUS CORONARY STENT INTERVENTION (PCI-S) N/A 12/06/2012   Procedure: PERCUTANEOUS CORONARY STENT INTERVENTION (PCI-S);  Surgeon: Burnell Blanks, MD;  Location: [facility] C[facility];  Service: Cardiovascular;  Laterality: N/A;       Home Medications    Prior to Admission medications   Medication Sig Start Date End Date Taking? Authorizing Provider  acetaminophen (TYLENOL) 500 MG tablet Take 1,000 mg by mouth every 8  (eight) hours as needed for moderate pain.    [provider]  aspirin 81 MG EC tablet Take 81 mg by mouth daily. Swallow whole.    [provider]  Biotin (SUPER BIOTIN) 5 MG TABS Take 1 tablet by mouth daily.    [provider]  cholecalciferol (VITAMIN D3) 25 MCG (1000 UNIT) tablet Take 1,000 Units by mouth daily.    [provider]  dicyclomine (BENTYL) 20 MG tablet Take 20 mg by mouth 4 (four) times daily as needed (IBS).     [provider]  docusate sodium (COLACE) 100 MG capsule Take 100 mg by mouth daily.     [provider]  Evolocumab (REPATHA SURECLICK) [id number] MG/ML SOAJ Inject 140 mg into the skin every 14 (fourteen) days. 12/01/22   Burnell Blanks, MD  fluticasone (FLONASE) 50 MCG/ACT nasal spray Place 1 spray into both nostrils at bedtime.  01/20/15   [provider]  gabapentin (NEURONTIN) 300 MG capsule Take 1 tablet PO in the morning and take 2 tablets PO at bedtime. 08/03/15   Marcial Pacas, MD  Multiple Vitamins-Minerals (MULTIVITAMIN WITH MINERALS) tablet Take 1 tablet by mouth daily.    [provider]  nitroGLYCERIN (NITROSTAT) 0.4 MG SL tablet Place 1 tablet (0.4 mg total) under the tongue every 5 (five) minutes as needed for chest pain. 01/15/20   Burnell Blanks, MD  olmesartan (BENICAR) 40 MG tablet Take 40 mg by mouth daily.    [provider]  oxyCODONE-acetaminophen (PERCOCET/ROXICET) 5-325 MG per tablet Take 1 tablet by mouth every 4 (four) hours as needed for severe pain.     [provider]  pantoprazole (PROTONIX) 40 MG tablet Take 40 mg by mouth 2 (two) times daily.     [provider]  senna (SENOKOT) 8.6 MG TABS Take 1-2 tablets by mouth daily.     [provider]  sildenafil (VIAGRA) 100 MG tablet Take 100 mg by mouth daily as needed for erectile dysfunction.    [provider]  simvastatin (ZOCOR) 40 MG tablet Take 40 mg by mouth at  bedtime. 09/22/21   [provider]  sucralfate (CARAFATE) 1 GM/10ML suspension Take 1 g by mouth 3 (three) times daily as needed (spasms). With Lidocaine 2%  and Mylanta    [provider]  traMADol (ULTRAM) 50 MG tablet Take 50 mg by mouth every 6 (six) hours as needed for moderate pain.    [provider]    Family History Family History  Problem Relation Age of Onset   Stomach cancer Mother    Congestive Heart Failure Father    Dementia Father    Heart disease Brother     Social History Social History   Tobacco Use   Smoking status: Former    Packs/day: 0.50    Years: 30.00    Total pack years: 15.00    Types: Cigarettes    Quit date: 12/04/2012  Years since quitting: 10.0   Smokeless tobacco: Never  Vaping Use   Vaping Use: Never used  Substance Use Topics   Alcohol use: No    Alcohol/week: 0.0 standard drinks of alcohol   Drug use: No     Allergies   Crestor [rosuvastatin calcium], Latex, and Rosuvastatin calcium   Review of Systems Review of Systems Per HPI  Physical Exam Triage Vital Signs ED Triage Vitals  Enc Vitals Group     BP 12/08/22 1521 (!) 146/80     Pulse Rate 12/08/22 1521 (!) 57     Resp 12/08/22 1521 18     Temp 12/08/22 1521 98 F (36.7 C)     Temp Source 12/08/22 1521 Oral     SpO2 12/08/22 1521 95 %     Weight --      Height --      Head Circumference --      Peak Flow --      Pain Score 12/08/22 1522 10     Pain Loc --      Pain Edu? --      Excl. in [place]? --    No data found.  Updated Vital Signs BP (!) 146/80 (BP Location: Left Arm)   Pulse (!) 57   Temp 98 F (36.7 C) (Oral)   Resp 18   SpO2 95%   Visual Acuity Right Eye Distance:   Left Eye Distance:   Bilateral Distance:    Right Eye Near:   Left Eye Near:    Bilateral Near:     Physical Exam Vitals and nursing note reviewed.  Constitutional:      General: He is not in acute distress.    Appearance: Normal appearance. He is not  toxic-appearing.  Pulmonary:     Effort: Pulmonary effort is normal. No respiratory distress.  Musculoskeletal:     Right lower leg: No edema.     Left lower leg: No edema.     Right foot: Normal. Normal range of motion and normal capillary refill. No swelling. Normal pulse.       Feet:     Comments: Inspection: No swelling, obvious deformity, or redness to the right heel  Palpation: Right heel tender to palpation in approximately area marked; no obvious deformities palpated ROM: Full ROM to ankle and flexibility of foot  Neurovascular: neurovascularly intact in right lower extremity   Skin:    General: Skin is warm and dry.     Capillary Refill: Capillary refill takes less than 2 seconds.     Coloration: Skin is not jaundiced or pale.     Findings: No erythema.  Neurological:     Mental Status: He is alert and oriented to person, place, and time.  Psychiatric:        Behavior: Behavior is cooperative.      UC Treatments / Results  Labs (all labs ordered are listed, but only abnormal results are displayed) Labs Reviewed - No data to display  EKG   Radiology DG Os Calcis Right  Result Date: 12/08/2022 CLINICAL DATA:  Pain x2 weeks without injury EXAM: RIGHT OS CALCIS - 2+ VIEW COMPARISON:  None Available. FINDINGS: There is no evidence of fracture or other focal bone lesions. Small calcaneal spurs. Soft tissues are unremarkable. IMPRESSION: Small calcaneal spurs. No acute findings. Electronically Signed   By: Lucrezia Europe M.D.   On: 12/08/2022 16:03    Procedures Procedures (including critical care time)  Medications Ordered in UC  Medications - No data to display  Initial Impression / Assessment and Plan / UC Course  I have reviewed the triage vital signs and the nursing notes.  Pertinent labs & imaging results that were available during my care of the patient were reviewed by me and considered in my medical decision making (see chart for details).   Patient is  well-appearing, normotensive, afebrile, not tachycardic, not tachypneic, oxygenating well on room air.    1. Pain of right heel Differentials include pain secondary to calcaneal spur versus plantar fasciitis Recommended follow-up with podiatrist In meantime, start plantar fasciitis exercises, try to get fitted insoles Can take Tylenol and try ice as needed for pain  The patient was given the opportunity to ask questions.  All questions answered to their satisfaction.  The patient is in agreement to this plan.    Final Clinical Impressions(s) / UC Diagnoses   Final diagnoses:  Pain of right heel     Discharge Instructions      The x-ray today shows spurring in your right heel.  This could explain your pain.  You could also have plantar fasciitis.  I recommended getting cushioned heel insoles to help with pain and provide support.  Also recommend following up with a Podiatrist for possible injection, to get fitted insoles, etc.   You can try the stretches I have attached.  Also rest, ice, and you can take Tylenol as needed for pain.    ED Prescriptions   None    PDMP not reviewed this encounter.   Eulogio Bear, NP 12/08/22 [phone]

## 2022-12-08 NOTE — ED Triage Notes (Signed)
Pt reports pain and swelling  in the right foot x 2 weeks. Pain is worse in the heel when walking.

## 2023-07-19 ENCOUNTER — Ambulatory Visit (HOSPITAL_COMMUNITY)
Admission: RE | Admit: 2023-07-19 | Discharge: 2023-07-19 | Disposition: A | Discharge status: Home / Self Care | Payer: Medicare Other | Source: Ambulatory Visit | Attending: Family Medicine | Admitting: Family Medicine

## 2023-07-19 ENCOUNTER — Ambulatory Visit
Admission: RE | Admit: 2023-07-19 | Discharge: 2023-07-19 | Disposition: A | Discharge status: Home / Self Care | Payer: Medicare Other | Source: Ambulatory Visit | Attending: Nurse Practitioner | Admitting: Nurse Practitioner

## 2023-07-19 VITALS — BP 113/60 | HR 63 | Temp 98.0°F | Resp 15

## 2023-07-19 DIAGNOSIS — M79604 Pain in right leg: Secondary | ICD-10-CM

## 2023-07-19 DIAGNOSIS — M7989 Other specified soft tissue disorders: Secondary | ICD-10-CM

## 2023-07-19 NOTE — ED Triage Notes (Signed)
Pt c/o right leg swelling x 3 days. Swelling is from the knee to the foot. Pt states a couple weeks ago he hit his leg on something that left him with knot on his leg and bruising.Marland Kitchen

## 2023-07-19 NOTE — Discharge Instructions (Signed)
We will contact you tomorrow if the blood work comes back abnormal.  In the meantime, recommend wearing a compression stocking to help with swelling of your lower extremities.    Please go to [facility] imaging department after leaving here.  They will be able to work you in to you can get the ultrasound today.  We will contact you if the ultrasound shows a blood clot in your leg.

## 2023-07-19 NOTE — ED Notes (Signed)
Called centralized scheduling and pt is able to be worked in for [place] at [employer] post discharge from UC. Pt and provider aware.

## 2023-07-19 NOTE — ED Provider Notes (Addendum)
[facility]    CSN: [id number] Arrival date & time: 07/19/23  1531      History   Chief Complaint No chief complaint on file.   HPI Russell Padilla is a 67 y.o. male.   Patient presents today with 3 day history of right lower extremity redness, swelling, and pain.  He denies recent fall, trauma, or injury to the leg.  No chest pain, shortness of breath, fever, body aches, or chills.  Reports a couple of weeks ago, he bumped his right leg on something in his garage and had a bruise, but did not break the skin.  He denies treatment for active cancer, has not recently been bedridden, denies calf pain or swelling, recent paralysis or recent immobilization of the right lower extremity.  Also denies history of DVT or recent large surgery.  Has not take anything for symptoms so far.  Reports he has been having a flareup of his IBD for the past couple of days but this is unrelated.    Past Medical History:  Diagnosis Date   Abdominal pain 07-11-13   ongoing and unspecified   Arthritis    Bladder stone 07-11-13   surgery planned   CAD (coronary artery disease)    a. 12/2012 Cath/PCI: DES to superior branch of OM2;  b. Repeat cath 4/14 with 20% LAD, patent stent OM3, 70% diffuse stenosis small, non-dominant RCA.   Essential hypertension    Headache    HLD (hyperlipidemia)    Renal disorder    renal calculi   Stomach pain    chronic    Patient Active Problem List   Diagnosis Date Noted   Right lumbar radiculopathy 03/01/2016   Chronic headache 03/01/2016   Frequent headaches 08/11/2015   Chronic neck pain 08/11/2015   Chronic low back pain 08/11/2015   Coronary artery disease involving native coronary artery of native heart without angina pectoris 04/13/2015   Abnormal stress test    Pain of upper abdomen 02/16/2015   Change in blood platelet count 02/16/2015   Essential hypertension    HLD (hyperlipidemia)    Benign prostatic hyperplasia without urinary obstruction  12/22/2014   Decreased libido 12/22/2014   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   Dyspnea 12/31/2013   Left elbow pain 11/29/2013   Chest pain, non-cardiac 10/16/2013   Chest pain 01/16/2013   Bradycardia 01/16/2013   Chest pain at rest 12/05/2012   Achalasia 07/21/2011   Hemorrhoids 11/02/2010   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   Constipation 11/02/2010   DYSPHAGIA 11/02/2010   FLATULENCE-GAS-BLOATING 11/02/2010   GERD 08/10/2009   ABDOMINAL PAIN RIGHT UPPER QUADRANT 08/10/2009   Nonspecific (abnormal) findings on radiological and other examination of biliary tract 08/10/2009   History of colonic polyps 08/10/2009   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   CORONARY STENT PLACEMENT     3'14   ESOPHAGOGASTRODUODENOSCOPY ENDOSCOPY     multiple times-[facility]/ and now being followed by Endo- [facility]   KNEE ARTHROSCOPY Left    '80's   LEFT HEART CATHETERIZATION WITH CORONARY  ANGIOGRAM N/A 12/05/2012   Procedure: LEFT HEART CATHETERIZATION WITH CORONARY ANGIOGRAM;  Surgeon: Ricki Rodriguez, MD;  Location: [facility];  Service: Cardiovascular;  Laterality: N/A;   LEFT HEART CATHETERIZATION WITH CORONARY ANGIOGRAM N/A 01/17/2013   Procedure: LEFT HEART CATHETERIZATION WITH CORONARY ANGIOGRAM;  Surgeon: Iran Ouch, MD;  Location: [facility];  Service: Cardiovascular;  Laterality: N/A;   PERCUTANEOUS CORONARY STENT INTERVENTION (PCI-S) N/A 12/06/2012   Procedure: PERCUTANEOUS CORONARY STENT INTERVENTION (PCI-S);  Surgeon: Kathleene Hazel, MD;  Location: [facility];  Service: Cardiovascular;  Laterality: N/A;       Home Medications    Prior to Admission medications   Medication Sig Start Date End Date Taking? Authorizing Provider  acetaminophen (TYLENOL) 500 MG tablet Take 1,000 mg by mouth every 8 (eight) hours as needed for moderate pain.    [provider]  aspirin 81 MG EC tablet Take 81 mg by mouth daily. Swallow whole.    [provider]  Biotin (SUPER BIOTIN) 5 MG TABS Take 1 tablet by mouth daily.    [provider]  cholecalciferol (VITAMIN D3) 25 MCG (1000 UNIT) tablet Take 1,000 Units by mouth daily.    [provider]  dicyclomine (BENTYL) 20 MG tablet Take 20 mg by mouth 4 (four) times daily as needed (IBS).     [provider]  docusate sodium (COLACE) 100 MG capsule Take 100 mg by mouth daily.     [provider]  Evolocumab (REPATHA SURECLICK) 140 MG/ML SOAJ Inject 140 mg into the skin every 14 (fourteen) days. 12/01/22   Kathleene Hazel, MD  fluticasone (FLONASE) 50 MCG/ACT nasal spray Place 1 spray into both nostrils at bedtime.  01/20/15   [provider]  gabapentin (NEURONTIN) 300 MG capsule Take 1 tablet PO in the morning and take 2 tablets PO at bedtime. 08/03/15   Levert Feinstein, MD  Multiple Vitamins-Minerals (MULTIVITAMIN WITH MINERALS) tablet Take 1 tablet by mouth daily.    [provider]  nitroGLYCERIN (NITROSTAT) 0.4 MG SL tablet Place 1 tablet (0.4 mg total) under the tongue every 5 (five) minutes as needed for chest pain. 01/15/20   Kathleene Hazel, MD  olmesartan (BENICAR) 40 MG tablet Take 40 mg by mouth daily.    [provider]  oxyCODONE-acetaminophen (PERCOCET/ROXICET) 5-325 MG per tablet Take 1 tablet by mouth every 4 (four) hours as needed for severe pain.     [provider]  pantoprazole (PROTONIX) 40 MG tablet Take 40 mg by mouth 2 (two) times daily.     [provider]  senna (SENOKOT) 8.6 MG TABS Take 1-2 tablets by mouth daily.     [provider]  sildenafil (VIAGRA) 100 MG tablet Take 100 mg by mouth daily as needed for erectile dysfunction.    [provider]  simvastatin (ZOCOR) 40 MG tablet Take 40 mg by mouth at bedtime. 09/22/21   [provider]  sucralfate (CARAFATE) 1 GM/10ML suspension Take 1 g by mouth 3 (three) times daily as needed (spasms). With Lidocaine 2%  and Mylanta    [provider]  traMADol (ULTRAM) 50 MG tablet Take 50 mg by mouth every 6 (six) hours as needed for moderate pain.    [provider]    Family History Family History  Problem Relation Age of Onset   Stomach cancer Mother    Congestive Heart Failure Father    Dementia Father    Heart  disease Brother     Social History Social History   Tobacco Use   Smoking status: Former    Current packs/day: 0.00    Average packs/day: 0.5 packs/day for 30.0 years (15.0 ttl pk-yrs)    Types: Cigarettes    Start date: 12/05/1982    Quit date: 12/04/2012    Years since quitting: 10.6   Smokeless tobacco: Never  Vaping Use   Vaping status: Never Used  Substance Use Topics   Alcohol use: No    Alcohol/week: 0.0 standard drinks of alcohol   Drug use: No     Allergies   Crestor [rosuvastatin calcium], Latex, and Rosuvastatin calcium   Review of Systems Review of Systems Per HPI  Physical Exam Triage Vital Signs ED Triage Vitals  Encounter Vitals Group     BP 07/19/23 1540 113/60     Systolic BP Percentile --      Diastolic BP Percentile --      Pulse Rate 07/19/23 1540 63     Resp 07/19/23 1540 15     Temp 07/19/23 1540 98 F (36.7 C)     Temp src --      SpO2 07/19/23 1540 95 %     Weight --      Height --      Head Circumference --      Peak Flow --      Pain Score 07/19/23 1541 5     Pain Loc --      Pain Education --      Exclude from Growth Chart --    No data found.  Updated Vital Signs BP 113/60  (BP Location: Right Arm)   Pulse 63   Temp 98 F (36.7 C)   Resp 15   SpO2 95%   Visual Acuity Right Eye Distance:   Left Eye Distance:   Bilateral Distance:    Right Eye Near:   Left Eye Near:    Bilateral Near:     Physical Exam Vitals and nursing note reviewed.  Constitutional:      General: He is not in acute distress.    Appearance: Normal appearance. He is not toxic-appearing.  HENT:     Mouth/Throat:     Mouth: Mucous membranes are moist.     Pharynx: Oropharynx is clear.  Pulmonary:     Effort: Pulmonary effort is normal. No respiratory distress.  Musculoskeletal:     Right lower leg: Edema present.     Left lower leg: Edema present.       Legs:     Comments: Swelling and erythema noted to right lower extremity in approximately area marked; area is not hot to touch and no obvious open wound noted. Pain to palpation of medial tibia.  No obvious deformity or bruising.  No calf tenderness, negative Homan's sign  Skin:    General: Skin is warm and dry.     Capillary Refill: Capillary refill takes less than 2 seconds.     Coloration: Skin is not jaundiced or pale.     Findings: No erythema.  Neurological:     Mental Status: He is alert and oriented to person, place, and time.  Psychiatric:        Behavior: Behavior is cooperative.      UC Treatments / Results  Labs (all labs ordered are listed, but only abnormal results are displayed) Labs Reviewed  CBC WITH DIFFERENTIAL/PLATELET  COMPREHENSIVE METABOLIC PANEL    EKG   Radiology No  results found.  Procedures Procedures (including critical care time)  Medications Ordered in UC Medications - No data to display  Initial Impression / Assessment and Plan / UC Course  I have reviewed the triage vital signs and the nursing notes.  Pertinent labs & imaging results that were available during my care of the patient were reviewed by me and considered in my medical decision making (see chart for  details).   Patient is well-appearing, normotensive, afebrile, not tachycardic, not tachypneic, oxygenating well on room air.    1. Pain and swelling of right lower extremity No red flags, low suspicion for DVT however given unilateral swelling and redness, will obtain venous ultrasound for DVT rule out If positive, recommend referral to DVT clinic CBC and CMP obtained for rule out metabolic abnormality Low suspicion for cellulitis given presentation Recommended compression stockings, strict ER precautions discussed with patient  The patient was given the opportunity to ask questions.  All questions answered to their satisfaction.  The patient is in agreement to this plan.    Final Clinical Impressions(s) / UC Diagnoses   Final diagnoses:  Pain and swelling of right lower extremity     Discharge Instructions      We will contact you tomorrow if the blood work comes back abnormal.  In the meantime, recommend wearing a compression stocking to help with swelling of your lower extremities.    Please go to [facility] imaging department after leaving here.  They will be able to work you in to you can get the ultrasound today.  We will contact you if the ultrasound shows a blood clot in your leg.    ED Prescriptions   None    PDMP not reviewed this encounter.   Valentino Nose, NP 07/19/23 1642    Valentino Nose, NP 07/19/23 [phone]

## 2023-07-20 LAB — COMPREHENSIVE METABOLIC PANEL
ALT: 17 [IU]/L (ref 0–44)
AST: 16 [IU]/L (ref 0–40)
Albumin: 3.8 g/dL — ABNORMAL LOW (ref 3.9–4.9)
Alkaline Phosphatase: 68 [IU]/L (ref 44–121)
BUN/Creatinine Ratio: 7 — ABNORMAL LOW (ref 10–24)
BUN: 9 mg/dL (ref 8–27)
Bilirubin Total: 0.3 mg/dL (ref 0.0–1.2)
CO2: 25 mmol/L (ref 20–29)
Calcium: 9 mg/dL (ref 8.6–10.2)
Chloride: 106 mmol/L (ref 96–106)
Creatinine, Ser: 1.25 mg/dL (ref 0.76–1.27)
Globulin, Total: 2.4 g/dL (ref 1.5–4.5)
Glucose: 97 mg/dL (ref 70–99)
Potassium: 5 mmol/L (ref 3.5–5.2)
Sodium: 144 mmol/L (ref 134–144)
Total Protein: 6.2 g/dL (ref 6.0–8.5)
eGFR: 63 mL/min/{1.73_m2} (ref >59)

## 2023-07-20 LAB — CBC WITH DIFFERENTIAL/PLATELET
Basophils Absolute: 0 10*3/uL (ref 0.0–0.2)
Basos: 1 %
EOS (ABSOLUTE): 0.4 10*3/uL (ref 0.0–0.4)
Eos: 6 %
Hematocrit: 36.8 % — ABNORMAL LOW (ref 37.5–51.0)
Hemoglobin: 11.5 g/dL — ABNORMAL LOW (ref 13.0–17.7)
Immature Grans (Abs): 0 10*3/uL (ref 0.0–0.1)
Immature Granulocytes: 0 %
Lymphocytes Absolute: 0.9 10*3/uL (ref 0.7–3.1)
Lymphs: 12 %
MCH: 26.7 pg (ref 26.6–33.0)
MCHC: 31.3 g/dL — ABNORMAL LOW (ref 31.5–35.7)
MCV: 86 fL (ref 79–97)
Monocytes Absolute: 0.5 10*3/uL (ref 0.1–0.9)
Monocytes: 8 %
Neutrophils Absolute: 5.1 10*3/uL (ref 1.4–7.0)
Neutrophils: 73 %
Platelets: 207 10*3/uL (ref 150–450)
RBC: 4.3 x10E6/uL (ref 4.14–5.80)
RDW: 13.9 % (ref 11.6–15.4)
WBC: 7 10*3/uL (ref 3.4–10.8)

## 2023-08-10 ENCOUNTER — Encounter: Payer: Medicare Other | Admitting: Vascular Surgery

## 2023-10-16 ENCOUNTER — Other Ambulatory Visit: Payer: Self-pay

## 2023-10-16 DIAGNOSIS — I739 Peripheral vascular disease, unspecified: Secondary | ICD-10-CM

## 2023-10-17 ENCOUNTER — Ambulatory Visit (INDEPENDENT_AMBULATORY_CARE_PROVIDER_SITE_OTHER): Payer: Medicare Other

## 2023-10-17 DIAGNOSIS — I739 Peripheral vascular disease, unspecified: Secondary | ICD-10-CM | POA: Diagnosis not present

## 2023-10-17 LAB — VAS US ABI WITH/WO TBI
Left ABI: 1.25
Right ABI: 1.17

## 2023-10-17 NOTE — Progress Notes (Signed)
Office Note     CC:  Lower extremity pain and edema  Requesting Provider:  Assunta Found, MD  HPI: Russell Padilla is a 68 y.o. (01/21/1956) male presenting at the request of .Assunta Found, MD for bilateral lower extremity pain in the legs.  On exam, Russell Padilla was doing well.  I retired Emergency planning/management officer, he currently lives in [place].  He has struggled over the last several months after losing his wife who battled with Parkinson's and dementia prior to her passing.  At the time of her passing, Russell Padilla was not taking care of himself, and noted severe lower extremity edema.  This resolved somewhat, but then Russell Padilla requiring high-dose prednisone.  He is still on this prednisone.  This caused anasarca.  He stated he feels like the Michelin man at this point.  Russell Padilla denies symptoms of claudication, ischemic rest pain, tissue loss. He has had no swelling on the dorsal aspects of his feet since October, but continues to have swelling in the calves. Denies varicose veins.  Does note worsening heaviness and swelling that is not. Has not worn compression stockings.    Past Medical History:  Diagnosis Date   Abdominal pain 07-11-13   ongoing and unspecified   Arthritis    Bladder stone 07-11-13   surgery planned   CAD (coronary artery disease)    a. 12/2012 Cath/PCI: DES to superior branch of OM2;  b. Repeat cath 4/14 with 20% LAD, patent stent OM3, 70% diffuse stenosis small, non-dominant RCA.   Essential hypertension    Headache    HLD (hyperlipidemia)    Renal disorder    renal calculi   Stomach pain    chronic    Past Surgical History:  Procedure Laterality Date   BACK SURGERY     2 cervical/ 2 lumbar fusions   CARDIAC CATHETERIZATION N/A 03/04/2015   Procedure: Left Heart Cath and Coronary Angiography;  Surgeon: Kathleene Hazel, MD;  Location: [facility] INVASIVE CV LAB;  Service: Cardiovascular;  Laterality: N/A;   CHOLECYSTECTOMY     laparoscopic.    CORONARY STENT PLACEMENT     3'14   ESOPHAGOGASTRODUODENOSCOPY ENDOSCOPY     multiple times-[facility]/ and now being followed by Endo- [facility]   KNEE ARTHROSCOPY Left    '80's   LEFT HEART CATHETERIZATION WITH CORONARY ANGIOGRAM N/A 12/05/2012   Procedure: LEFT HEART CATHETERIZATION WITH CORONARY ANGIOGRAM;  Surgeon: Ricki Rodriguez, MD;  Location: [facility] CATH LAB;  Service: Cardiovascular;  Laterality: N/A;   LEFT HEART CATHETERIZATION WITH CORONARY ANGIOGRAM N/A 01/17/2013   Procedure: LEFT HEART CATHETERIZATION WITH CORONARY ANGIOGRAM;  Surgeon: Iran Ouch, MD;  Location: [facility] CATH LAB;  Service: Cardiovascular;  Laterality: N/A;   PERCUTANEOUS CORONARY STENT INTERVENTION (PCI-S) N/A 12/06/2012   Procedure: PERCUTANEOUS CORONARY STENT INTERVENTION (PCI-S);  Surgeon: Kathleene Hazel, MD;  Location: [facility] CATH LAB;  Service: Cardiovascular;  Laterality: N/A;    Social History   Socioeconomic History   Marital status: Married    Spouse name: Not on file   Number of children: 0   Years of education: 14   Highest education level: Not on file  Occupational History   Occupation: Retired  Tobacco Use   Smoking status: Former    Current packs/day: 0.00    Average packs/day: 0.5 packs/day for 30.0 years (15.0 ttl pk-yrs)    Types: Cigarettes    Start date: 12/05/1982    Quit date: 12/04/2012    Years  since quitting: 10.8   Smokeless tobacco: Never  Vaping Use   Vaping status: Never Used  Substance and Sexual Activity   Alcohol use: No    Alcohol/week: 0.0 standard drinks of alcohol   Drug use: No   Sexual activity: Not Currently  Other Topics Concern   Not on file  Social History Narrative   Lives at home with his wife, Russell Padilla.   Right-handed.   4-5 cans diet colas and occasional glasses of tea per day.   Retired 2007.   Social Drivers of Corporate investment banker Strain: Not on file  Food Insecurity: Not on file  Transportation Needs: Not on file  Physical Activity: Not on  file  Stress: Not on file  Social Connections: Unknown (02/14/2022)   Received from [facility], [facility]   Social Network    Social Network: Not on file  Intimate Partner Violence: Unknown (01/06/2022)   Received from [facility], [facility]   HITS    Physically Hurt: Not on file    Insult or Talk Down To: Not on file    Threaten Physical Harm: Not on file    Scream or Curse: Not on file   Family History  Problem Relation Age of Onset   Stomach cancer Mother    Congestive Heart Failure Father    Dementia Father    Heart disease Brother     Current Outpatient Medications  Medication Sig Dispense Refill   acetaminophen (TYLENOL) 500 MG tablet Take 1,000 mg by mouth every 8 (eight) hours as needed for moderate pain.     aspirin 81 MG EC tablet Take 81 mg by mouth daily. Swallow whole.     Biotin (SUPER BIOTIN) 5 MG TABS Take 1 tablet by mouth daily.     cholecalciferol (VITAMIN D3) 25 MCG (1000 UNIT) tablet Take 1,000 Units by mouth daily.     dicyclomine (BENTYL) 20 MG tablet Take 20 mg by mouth 4 (four) times daily as needed (IBS).      docusate sodium (COLACE) 100 MG capsule Take 100 mg by mouth daily.      Evolocumab (REPATHA SURECLICK) 140 MG/ML SOAJ Inject 140 mg into the skin every 14 (fourteen) days. 2 mL 11   fluticasone (FLONASE) 50 MCG/ACT nasal spray Place 1 spray into both nostrils at bedtime.      gabapentin (NEURONTIN) 300 MG capsule Take 1 tablet PO in the morning and take 2 tablets PO at bedtime. 270 capsule 3   Multiple Vitamins-Minerals (MULTIVITAMIN WITH MINERALS) tablet Take 1 tablet by mouth daily.     nitroGLYCERIN (NITROSTAT) 0.4 MG SL tablet Place 1 tablet (0.4 mg total) under the tongue every 5 (five) minutes as needed for chest pain. 25 tablet 5   olmesartan (BENICAR) 40 MG tablet Take 40 mg by mouth daily.     oxyCODONE-acetaminophen (PERCOCET/ROXICET) 5-325 MG per tablet Take 1 tablet by mouth every 4 (four) hours as needed for severe pain.       pantoprazole (PROTONIX) 40 MG tablet Take 40 mg by mouth 2 (two) times daily.      senna (SENOKOT) 8.6 MG TABS Take 1-2 tablets by mouth daily.      sildenafil (VIAGRA) 100 MG tablet Take 100 mg by mouth daily as needed for erectile dysfunction.     simvastatin (ZOCOR) 40 MG tablet Take 40 mg by mouth at bedtime.     sucralfate (CARAFATE) 1 GM/10ML suspension Take 1 g by mouth 3 (three) times  daily as needed (spasms). With Lidocaine 2%  and Mylanta     traMADol (ULTRAM) 50 MG tablet Take 50 mg by mouth every 6 (six) hours as needed for moderate pain.     No current facility-administered medications for this visit.    Allergies  Allergen Reactions   Crestor [Rosuvastatin Calcium] Other (See Comments)    Aches    Latex Hives and Rash    When at dentist   Rosuvastatin Calcium Other (See Comments)    Aches     REVIEW OF SYSTEMS:  [X]  denotes positive finding, [ ]  denotes negative finding Cardiac  Comments:  Chest pain or chest pressure:    Shortness of breath upon exertion:    Short of breath when lying flat:    Irregular heart rhythm:        Vascular    Pain in calf, thigh, or hip brought on by ambulation:    Pain in feet at night that wakes you up from your sleep:     Blood clot in your veins:    Leg swelling:         Pulmonary    Oxygen at home:    Productive cough:     Wheezing:         Neurologic    Sudden weakness in arms or legs:     Sudden numbness in arms or legs:     Sudden onset of difficulty speaking or slurred speech:    Temporary loss of vision in one eye:     Problems with dizziness:         Gastrointestinal    Blood in stool:     Vomited blood:         Genitourinary    Burning when urinating:     Blood in urine:        Psychiatric    Major depression:         Hematologic    Bleeding problems:    Problems with blood clotting too easily:        Skin    Rashes or ulcers:        Constitutional    Fever or chills:      PHYSICAL  EXAMINATION:  There were no vitals filed for this visit.  General:  WDWN in NAD; vital signs documented above Gait: Not observed HENT: WNL, normocephalic Pulmonary: normal non-labored breathing , without wheezing Cardiac: regular HR Abdomen: soft, NT, no masses Skin: without rashes Vascular Exam/Pulses:  Right Left  Radial 2+ (normal) 2+ (normal)  Ulnar    Femoral    Popliteal    DP 2+ (normal) 2+ (normal)  PT     Extremities: without ischemic changes, without Gangrene , without cellulitis; without open wounds;  2+ pitting edema in the calves bilaterally. Musculoskeletal: no muscle wasting or atrophy  Neurologic: A&O X 3;  No focal weakness or paresthesias are detected Psychiatric:  The pt has Normal affect.   Non-Invasive Vascular Imaging:   +-------+-----------+-----------+------------+------------+  ABI/TBIToday's ABIToday's TBIPrevious ABIPrevious TBI  +-------+-----------+-----------+------------+------------+  Right 1.17       0.99                                 +-------+-----------+-----------+------------+------------+  Left  1.25       0.99                                 +-------+-----------+-----------+------------+------------+  ASSESSMENT/PLAN: Russell Padilla is a 68 y.o. male presenting with bilateral lower extremity swelling.  The swelling was first appreciated in October when the patient was under significant stress taking care of his dying wife who was struggling with both Parkinson's and dementia.  This swelling improved in November /December, however he has been on hide his prednisone for Padilla for the last month and a half with anasarca from the steroids.  From a peripheral vascular perspective, he has palpable pulses in his feet.  Imaging was reviewed-ABIs are normal, no DVT, SVT.   I have no concern for peripheral arterial disease. Leg does have edema, he has a medical reason for this.  Being that the edema started prior to the  initiation of prednisone, he would benefit from lower extremity reflux study in an effort to see if he has competent valves in both the superficial and deep systems.   Heath and I had a long discussion regarding the above.  I think that the prednisone is likely culprit, and that he would also benefit from new echocardiogram to ensure that there is not a cardiac component with his extensive history.  Should the lower extremity venous study demonstrate reflux, the treatment would be bilateral lower extremity compression.  If the compression did not work after 3 months, we can discuss possible intervention.  Mykah was measured, and given compression stockings today while in clinic. My plan is to call him with results of the study in the coming weeks.    Russell Sparrow, MD [facility] [phone]

## 2023-10-19 ENCOUNTER — Ambulatory Visit
Admission: RE | Admit: 2023-10-19 | Discharge: 2023-10-19 | Disposition: A | Discharge status: Home / Self Care | Payer: Medicare Other | Source: Ambulatory Visit | Attending: Family Medicine | Admitting: Family Medicine

## 2023-10-19 ENCOUNTER — Ambulatory Visit: Payer: Medicare Other | Admitting: Vascular Surgery

## 2023-10-19 ENCOUNTER — Encounter: Payer: Self-pay | Admitting: Vascular Surgery

## 2023-10-19 ENCOUNTER — Other Ambulatory Visit: Payer: Self-pay | Admitting: Family Medicine

## 2023-10-19 VITALS — BP 125/81 | HR 75 | Temp 97.9°F | Resp 20 | Ht 70.5 in | Wt 244.0 lb

## 2023-10-19 DIAGNOSIS — M7989 Other specified soft tissue disorders: Secondary | ICD-10-CM

## 2023-10-19 DIAGNOSIS — R7612 Nonspecific reaction to cell mediated immunity measurement of gamma interferon antigen response without active tuberculosis: Secondary | ICD-10-CM

## 2023-10-26 ENCOUNTER — Other Ambulatory Visit: Payer: Self-pay

## 2023-10-26 DIAGNOSIS — I739 Peripheral vascular disease, unspecified: Secondary | ICD-10-CM

## 2023-10-26 DIAGNOSIS — M7989 Other specified soft tissue disorders: Secondary | ICD-10-CM

## 2023-11-03 NOTE — Progress Notes (Addendum)
 Cardiology Office Note    Date:  11/06/2023  ID:  Alonzo, Owczarzak Aug 12, 1956, MRN [id number] PCP:  Assunta Found, MD  Cardiologist:  Verne Carrow, MD  Electrophysiologist:  None   Chief Complaint: throat discomfort, fatigue  History of Present Illness: .    Russell Padilla is a 68 y.o. male with visit-pertinent history of CAD s/p DES to OM2 12/2012 with residual nonobstructive disease, HTN, HLD, IBD, esophageal stricture, chronic neck/shoulder pain, mild carotid disease, arthritis, renal calculi, progressive anemia with iron deficiency seen for follow-up. Last cath 03/2015 (prompted by abnormal stress test) showed patent OM stent, severe stenosis of small sub-branch of OM2 jailed by stent and unchanged, moderate disease in small non-dominant RCA, chronic occlusion of OM1 with filling from left to left collaterals. Last carotid duplex 09/2022 showed near-normal with only minimal thickening.   He reports that his last several months have been rough. He has been following closely with GI for his IBD, reports colonoscopy/EGD done in January of last year and had to have esophagus stretched at that time. He was having a lot of blood in his stool then as well. He has been treated with prednisone and Humira. His beloved wife passed away in Jul 30, 2024 and he began having increasing LE edema as well as facial bloating around that time in the setting of prednisone use. BNP 07/31/2023 by outside labs was normal. He saw vascular surgery for his edema. ABIs 10/2023 were normal. He has venous reflux study pending. In the meantime he also reports for the last month and a half he's had 3 separate episodes of throat discomfort that he's not sure if cardiac or GI in nature. He reports he saw ENT and had office scope and everything looked OK. These episodes seemed to happen when he exerted himself, but other times he has been able to exert himself (I.e. going up and down stairs carrying laundry or groceries) without  provocation of this. The last episode was several weeks ago. He took old expired SL NTG and the symptoms were slow to ease off, episodes lasting 10-15 minutes each, without other associated symptoms. He notes generalized fatigue. No SOB, syncope.   His labwork has demonstrated a gradual steady drop in his Hgb and PCP labs earlier this month also demonstrated iron deficiency. Per his records PCP had forwarded to Dr. Lorenso Quarry felt stable in setting of IBD. It's unclear from his documentation to me if he was supposed to start any therapy for this. He sees GI later this month. His prior rectal bleeding that he was having last year is much improved. He also reports ongoing issue with food getting stuck in his esophagus, sometimes will hack up a piece of food an hour later.  Remote Hgb baseline 13-15 10/2022 13.6 Jul 31, 2023 11.5 10/2023 10.3   Labwork independently reviewed: [facility] 10/13/23 Hgb 10.3 [facility] 09/08/23 BUN 5, Cr 1.33, LDL 22, trig 59, TSH wnl, K 4.5, albumin 3.7 Jul 31, 2023 K 5.0, Cr 1.25, alb 3.8, AST ALT OK, Hb 11.5, plt 207 down from 15 in 2016, BNP wnl at 66 10/2022 [facility] Hgb 13.6 01/2022 LDL 20, trig 98  ROS: .    Please see the history of present illness.  All other systems are reviewed and otherwise negative.  Studies Reviewed: Marland Kitchen    EKG:  EKG is ordered today, personally reviewed, demonstrating NSR with one PAC, nonspecific STTW changes, no acute change from prior  CV Studies: Cardiac studies reviewed are outlined and summarized above. Otherwise  please see EMR for full report.   Current Reported Medications:.    Current Meds  Medication Sig   acetaminophen (TYLENOL) 500 MG tablet Take 1,000 mg by mouth every 8 (eight) hours as needed for moderate pain.   adalimumab (HUMIRA-CD/UC/HS STARTER) 80 MG/0.8ML pen take 2 pens( 160mg ) on day one and 1 pen(80mg ) on day 15 Subcutaneous as directed for 15 days   aspirin 81 MG EC tablet Take 81 mg by mouth daily. Swallow whole.   Biotin (SUPER  BIOTIN) 5 MG TABS Take 1 tablet by mouth daily.   cholecalciferol (VITAMIN D3) 25 MCG (1000 UNIT) tablet Take 1,000 Units by mouth daily.   dicyclomine (BENTYL) 20 MG tablet Take 20 mg by mouth 4 (four) times daily as needed (IBS).    docusate sodium (COLACE) 100 MG capsule Take 100 mg by mouth daily.    Evolocumab (REPATHA SURECLICK) 140 MG/ML SOAJ Inject 140 mg into the skin every 14 (fourteen) days.   fluticasone (FLONASE) 50 MCG/ACT nasal spray Place 1 spray into both nostrils at bedtime.    gabapentin (NEURONTIN) 300 MG capsule Take 1 tablet PO in the morning and take 2 tablets PO at bedtime.   Multiple Vitamins-Minerals (MULTIVITAMIN WITH MINERALS) tablet Take 1 tablet by mouth daily.   nitroGLYCERIN (NITROSTAT) 0.4 MG SL tablet Place 1 tablet (0.4 mg total) under the tongue every 5 (five) minutes as needed for chest pain.   olmesartan (BENICAR) 40 MG tablet Take 40 mg by mouth daily.   oxyCODONE-acetaminophen (PERCOCET/ROXICET) 5-325 MG per tablet Take 1 tablet by mouth every 4 (four) hours as needed for severe pain.    pantoprazole (PROTONIX) 40 MG tablet Take 40 mg by mouth 2 (two) times daily.    senna (SENOKOT) 8.6 MG TABS Take 1-2 tablets by mouth daily.    sildenafil (VIAGRA) 100 MG tablet Take 100 mg by mouth daily as needed for erectile dysfunction.   simvastatin (ZOCOR) 40 MG tablet Take 40 mg by mouth at bedtime.   sucralfate (CARAFATE) 1 GM/10ML suspension Take 1 g by mouth 3 (three) times daily as needed (spasms). With Lidocaine 2%  and Mylanta   traMADol (ULTRAM) 50 MG tablet Take 50 mg by mouth every 6 (six) hours as needed for moderate pain.    Physical Exam:    VS:  BP 116/78 (BP Location: Right Arm, Patient Position: Sitting, Cuff Size: Large)   Pulse 74   Ht 5' 10.5" (1.791 m)   Wt 249 lb (112.9 kg)   SpO2 96%   BMI 35.22 kg/m    Wt Readings from Last 3 Encounters:  11/06/23 249 lb (112.9 kg)  10/19/23 244 lb (110.7 kg)  09/23/22 239 lb 6.4 oz (108.6 kg)     GEN: Well nourished, well developed in no acute distress NECK: No JVD; No carotid bruits. No acute facial swelling appreciated CARDIAC: RRR, no murmurs, rubs, gallops RESPIRATORY:  Clear to auscultation without rales, wheezing or rhonchi  ABDOMEN: Soft, non-tender, non-distended EXTREMITIES:  Mild BLE edema; No acute deformity   Asessement and Plan:.    1. CAD with atypical throat/precordial pain - mixed features, difficult to tell if angina or noncardiac. He is also noted to have progressive anemia with iron deficiency on labs. It's unclear to me from his printed records if he was supposed to start any therapy for this; his notes indicate recommendation to call in TE. I will message Dr. Lorenso Quarry to review. Will proceed with 2D echo and cardiac PET for further  risk stratification. Get f/u H/H today. Refill SL NTG per his request. Reviewed drug/drug interaction with sildenafil, advised to avoid NTG within 48 hours of sildenafil and vice versa. Addendum: was able to connect with Dr. Lorenso Quarry. She suspects anemia is caused from IBD. She has requested their office start the process for supplemental iron infusion for him. Appreciate the collaboration of care.   Informed Consent   Shared Decision Making/Informed Consent The risks [chest pain, shortness of breath, cardiac arrhythmias, dizziness, blood pressure fluctuations, myocardial infarction, stroke/transient ischemic attack, nausea, vomiting, allergic reaction, radiation exposure, metallic taste sensation and life-threatening complications (estimated to be 1 in 10,000)], benefits (risk stratification, diagnosing coronary artery disease, treatment guidance) and alternatives of a cardiac PET stress test were discussed in detail with Mr. Thomley and he agrees to proceed.    2. Lower extremity edema - BNP normal 07/2023, recent TSH also normal. He relates the development of this to his use of prednisone and loss of his wife. The fact that he feels swollen  in other areas including his face alludes to steroid-precipitated fluid retention. However, we need to ensure LV function is stable. Will obtain echocardiogram and go from there. Also seeing vascular surgery for reflux studies. Also encouraged ongoing GI f/u for management of his prednisone.  3. Essential HTN - controlled, no changes made today.  4. HLD - controlled, no changes made today. Had prior issues with rosuvastatin but tolerating simvastatin, prescribed by outside source. Also on Repatha.  5. Mild carotid disease - last duplex 2023 showed near-normal carotids, consider repeat in 2026 given generalized stability.     Disposition: F/u with me or APP 4-6 weeks. Patient lives in [place] but does eventually plan to see Dr. Clifton James back as well.  Signed, Laurann Montana, PA-C

## 2023-11-06 ENCOUNTER — Encounter: Payer: Self-pay | Admitting: Physician Assistant

## 2023-11-06 ENCOUNTER — Ambulatory Visit: Payer: Medicare Other | Attending: Physician Assistant | Admitting: Physician Assistant

## 2023-11-06 VITALS — BP 116/78 | HR 74 | Ht 70.5 in | Wt 249.0 lb

## 2023-11-06 DIAGNOSIS — E785 Hyperlipidemia, unspecified: Secondary | ICD-10-CM | POA: Diagnosis not present

## 2023-11-06 DIAGNOSIS — I2510 Atherosclerotic heart disease of native coronary artery without angina pectoris: Secondary | ICD-10-CM

## 2023-11-06 DIAGNOSIS — R072 Precordial pain: Secondary | ICD-10-CM

## 2023-11-06 DIAGNOSIS — D509 Iron deficiency anemia, unspecified: Secondary | ICD-10-CM

## 2023-11-06 DIAGNOSIS — R600 Localized edema: Secondary | ICD-10-CM

## 2023-11-06 DIAGNOSIS — I10 Essential (primary) hypertension: Secondary | ICD-10-CM

## 2023-11-06 DIAGNOSIS — I779 Disorder of arteries and arterioles, unspecified: Secondary | ICD-10-CM

## 2023-11-06 MED ORDER — NITROGLYCERIN 0.4 MG SL SUBL: 0.4000 mg | SUBLINGUAL_TABLET | SUBLINGUAL | 5 refills | Status: AC | PRN

## 2023-11-06 NOTE — Patient Instructions (Signed)
Do not take nitroglycerin if you have taken sildenafil in the last 48 hours. The opposite is true as well. Do not take sildenafil if you have taken nitroglycerin in the last 48 hours. If you take these two medicines, they can cause low blood pressure if taken close together.   Medication Instructions:  Your physician recommends that you continue on your current medications as directed. Please refer to the Current Medication list given to you today.  *If you need a refill on your cardiac medications before your next appointment, please call your pharmacy*   Lab Work: Your physician recommends that you return for lab work in: Today   If you have labs (blood work) drawn today and your tests are completely normal, you will receive your results only by: MyChart Message (if you have MyChart) OR A paper copy in the mail If you have any lab test that is abnormal or we need to change your treatment, we will call you to review the results.   Testing/Procedures:    Please report to Radiology at the [facility] Main Entrance 30 minutes early for your test.  [address] [place], [place] [address]                         OR   Please report to Radiology at [facility] Main Entrance, medical mall, 30 mins prior to your test.  [address]  [place], [place]  How to Prepare for Your Cardiac PET/CT Stress Test:  Nothing to eat or drink, except water, 3 hours prior to arrival time.  NO caffeine/decaffeinated products, or chocolate 12 hours prior to arrival. (Please note decaffeinated beverages (teas/coffees) still contain caffeine).  If you have caffeine within 12 hours prior, the test will need to be rescheduled.  Medication instructions: Do not take erectile dysfunction medications for 72 hours prior to test (sildenafil, tadalafil) Do not take nitrates (isosorbide mononitrate, Ranexa) the day before or day of test Do not take tamsulosin the day before or  morning of test Hold theophylline containing medications for 12 hours. Hold Dipyridamole 48 hours prior to the test.  Diabetic Preparation: If able to eat breakfast prior to 3 hour fasting, you may take all medications, including your insulin. Do not worry if you miss your breakfast dose of insulin - start at your next meal. If you do not eat prior to 3 hour fast-Hold all diabetes (oral and insulin) medications. Patients who wear a continuous glucose monitor MUST remove the device prior to scanning.  You may take your remaining medications with water.  NO perfume, cologne or lotion on chest or abdomen area. FEMALES - Please avoid wearing dresses to this appointment.  Total time is 1 to 2 hours; you may want to bring reading material for the waiting time.  IF YOU THINK YOU MAY BE PREGNANT, OR ARE NURSING PLEASE INFORM THE TECHNOLOGIST.  In preparation for your appointment, medication and supplies will be purchased.  Appointment availability is limited, so if you need to cancel or reschedule, please call the Radiology Department Scheduler at [phone] 24 hours in advance to avoid a cancellation fee of $100.00  What to Expect When you Arrive:  Once you arrive and check in for your appointment, you will be taken to a preparation room within the Radiology Department.  A technologist or Nurse will obtain your medical history, verify that you are correctly prepped for the exam, and explain the  procedure.  Afterwards, an IV will be started in your arm and electrodes will be placed on your skin for EKG monitoring during the stress portion of the exam. Then you will be escorted to the PET/CT scanner.  There, staff will get you positioned on the scanner and obtain a blood pressure and EKG.  During the exam, you will continue to be connected to the EKG and blood pressure machines.  A small, safe amount of a radioactive tracer will be injected in your IV to obtain a series of pictures of your heart  along with an injection of a stress agent.    After your Exam:  It is recommended that you eat a meal and drink a caffeinated beverage to counter act any effects of the stress agent.  Drink plenty of fluids for the remainder of the day and urinate frequently for the first couple of hours after the exam.  Your doctor will inform you of your test results within 7-10 business days.  For more information and frequently asked questions, please visit our website: [email or web address]  For questions about your test or how to prepare for your test, please call: Cardiac Imaging Nurse Navigators Office: [phone]    Follow-Up: At [facility], you and your health needs are our priority.  As part of our continuing mission to provide you with exceptional heart care, we have created designated Provider Care Teams.  These Care Teams include your primary Cardiologist (physician) and Advanced Practice Providers (APPs -  Physician Assistants and Nurse Practitioners) who all work together to provide you with the care you need, when you need it.  We recommend signing up for the patient portal called "MyChart".  Sign up information is provided on this After Visit Summary.  MyChart is used to connect with patients for Virtual Visits (Telemedicine).  Patients are able to view lab/test results, encounter notes, upcoming appointments, etc.  Non-urgent messages can be sent to your provider as well.   To learn more about what you can do with MyChart, go to [email or web address].    Your next appointment:   6 week(s)  Provider:   You will see one of the following Advanced Practice Providers on your designated Care Team:   Randall An, PA-C  Jacolyn Reedy, PA-C    Other Instructions Thank you for choosing [facility]!

## 2023-11-06 NOTE — Addendum Note (Signed)
Addended by: Laurann Montana on: 11/06/2023 04:28 PM   Modules accepted: Orders

## 2023-11-07 LAB — HEMOGLOBIN AND HEMATOCRIT, BLOOD
Hematocrit: 35.4 % — ABNORMAL LOW (ref 37.5–51.0)
Hemoglobin: 10.6 g/dL — ABNORMAL LOW (ref 13.0–17.7)

## 2023-11-08 ENCOUNTER — Ambulatory Visit (HOSPITAL_COMMUNITY)
Admission: RE | Admit: 2023-11-08 | Discharge: 2023-11-08 | Disposition: A | Discharge status: Home / Self Care | Payer: Medicare Other | Source: Ambulatory Visit | Attending: Vascular Surgery | Admitting: Vascular Surgery

## 2023-11-08 DIAGNOSIS — M7989 Other specified soft tissue disorders: Secondary | ICD-10-CM | POA: Insufficient documentation

## 2023-11-09 ENCOUNTER — Encounter (HOSPITAL_COMMUNITY): Payer: Medicare Other

## 2023-11-10 ENCOUNTER — Ambulatory Visit (INDEPENDENT_AMBULATORY_CARE_PROVIDER_SITE_OTHER): Payer: Medicare Other | Admitting: Vascular Surgery

## 2023-11-10 DIAGNOSIS — I872 Venous insufficiency (chronic) (peripheral): Secondary | ICD-10-CM

## 2023-11-10 NOTE — Progress Notes (Signed)
 I called Russell Padilla regarding his recent lower extremity venous reflux study.   Russell Padilla continues to appreciate bilateral lower extremity edema, left greater than right.  This is accompanied by heaviness and a tired feeling by days end.  He is currently being weaned off of prednisone , and a started Humira for his IBD.  Upon evaluation of the venous reflux study, there is both superficial and deep reflux. The greater saphenous vein itself is relatively small except for at the saphenofemoral junction.  At this size, I do not think venous ablation would have significant benefit. I recommend he continue treatment with compression.  If he continues to have swelling once he is off prednisone , I think it is reasonable to discuss greater saphenous vein ablation should the greater saphenous vein dilate.  I have set him up for 66-month follow-up.    Fonda FORBES Rim MD

## 2023-11-11 ENCOUNTER — Other Ambulatory Visit: Payer: Self-pay | Admitting: Cardiovascular Disease

## 2023-11-13 ENCOUNTER — Telehealth: Payer: Self-pay | Admitting: Cardiovascular Disease

## 2023-11-13 MED ORDER — REPATHA SURECLICK 140 MG/ML ~~LOC~~ SOAJ: 140.0000 mg | SUBCUTANEOUS | 3 refills | Status: DC

## 2023-11-13 NOTE — Telephone Encounter (Signed)
*  STAT* If patient is at the pharmacy, call can be transferred to refill team.   1. Which medications need to be refilled? (please list name of each medication and dose if known) Evolocumab  (REPATHA  SURECLICK) 140 MG/ML SOAJ   2. Which pharmacy/location (including street and city if local pharmacy) is medication to be sent to? [facility] [phone] - , [place] - [address] AT [facility] [address] [phone]   3. Do they need a 30 day or 90 day supply? 90

## 2023-11-13 NOTE — Telephone Encounter (Signed)
 He already sees Dr. Oswaldo Blessing so referral not needed  - please let him know Dr. Oswaldo Blessing replied to me that she suspects his anemia is related to his IBD and had requested that her office start the process for supplemental IV iron infusions for him - please ask him to reach out to Dr. Cyndi Drain office to inquire about this if he had not heard back yet from them. Thanks.

## 2023-11-13 NOTE — Telephone Encounter (Signed)
 Pt calling to check status of referral to Dr. Oswaldo Blessing??? Requesting cb

## 2023-11-13 NOTE — Telephone Encounter (Signed)
 Left message for patient informing him of [name]'s message and recommendation to call Dr. Cyndi Drain office. Provided their office number [phone].  Also provided our office number for callback if any other questions.

## 2023-11-20 NOTE — Progress Notes (Unsigned)
 [facility] CONSULT NOTE  Patient Care Team: Tally Joe, MD as PCP - General (Family Medicine) Kathleene Hazel, MD as PCP - Cardiology (Cardiology)  ASSESSMENT & PLAN:  @AGE  male with history of IBD being seen for iron deficiency anemia.  Relevant history: IBD Last colonoscopy: Last EGD:  No problem-specific Assessment & Plan notes found for this encounter.  No orders of the defined types were placed in this encounter.    The mechanism of IDA is due to either blood loss or decreased absorptive mechanism or both. We discussed some of the risks, benefits, and alternatives of intravenous iron infusions. The patient is symptomatic from anemia and the iron level is critically low. He tolerated oral iron supplement poorly and desires to achieved higher levels of iron faster for adequate hematopoesis. Some of the side-effects to be expected including risks of infusion reactions, phlebitis, headaches, nausea and fatigue.  The patient is willing to proceed. Patient education material was dispensed.   IV iron *** ordered. CBC, Iron, TIBC, ferritin Referral for colonoscopy and EGD  All questions were answered. The patient knows to call the clinic with any problems, questions or concerns.  Melven Sartorius, MD 2/17/202510:45 PM   CHIEF COMPLAINTS/PURPOSE OF CONSULTATION:  Anemia  HISTORY OF PRESENTING ILLNESS:  Russell Padilla 68 y.o. male is here because of anemia.  Trentyn had not noticed any recent bleeding such as melena, hematuria or hematochezia His last colonoscopy was *** ***He had no prior history or diagnosis of cancer. His age appropriate screening programs are up-to-date. ***He denies any pica and eats a variety of diet. ***He denies blood donation or received blood transfusion ***The patient was prescribed oral iron supplements and he takes ***  MEDICAL HISTORY:  Past Medical History:  Diagnosis Date   Abdominal pain 07-11-13   ongoing and  unspecified   Arthritis    Bladder stone 07-11-13   surgery planned   CAD (coronary artery disease)    a. 12/2012 Cath/PCI: DES to superior branch of OM2;  b. Repeat cath 4/14 with 20% LAD, patent stent OM3, 70% diffuse stenosis small, non-dominant RCA.   Essential hypertension    Headache    HLD (hyperlipidemia)    Renal disorder    renal calculi   Stomach pain    chronic    SURGICAL HISTORY: Past Surgical History:  Procedure Laterality Date   BACK SURGERY     2 cervical/ 2 lumbar fusions   CARDIAC CATHETERIZATION N/A 03/04/2015   Procedure: Left Heart Cath and Coronary Angiography;  Surgeon: Kathleene Hazel, MD;  Location: [facility] INVASIVE CV LAB;  Service: Cardiovascular;  Laterality: N/A;   CHOLECYSTECTOMY     laparoscopic.   CORONARY STENT PLACEMENT     3'14   ESOPHAGOGASTRODUODENOSCOPY ENDOSCOPY     multiple times-[facility]/ and now being followed by Endo- [facility]   KNEE ARTHROSCOPY Left    '80's   LEFT HEART CATHETERIZATION WITH CORONARY ANGIOGRAM N/A 12/05/2012   Procedure: LEFT HEART CATHETERIZATION WITH CORONARY ANGIOGRAM;  Surgeon: Ricki Rodriguez, MD;  Location: [facility] CATH LAB;  Service: Cardiovascular;  Laterality: N/A;   LEFT HEART CATHETERIZATION WITH CORONARY ANGIOGRAM N/A 01/17/2013   Procedure: LEFT HEART CATHETERIZATION WITH CORONARY ANGIOGRAM;  Surgeon: Iran Ouch, MD;  Location: [facility] CATH LAB;  Service: Cardiovascular;  Laterality: N/A;   PERCUTANEOUS CORONARY STENT INTERVENTION (PCI-S) N/A 12/06/2012   Procedure: PERCUTANEOUS CORONARY STENT INTERVENTION (PCI-S);  Surgeon: Kathleene Hazel, MD;  Location: [facility] CATH LAB;  Service: Cardiovascular;  Laterality: N/A;    SOCIAL HISTORY: Social History   Socioeconomic History   Marital status: Married    Spouse name: Not on file   Number of children: 0   Years of education: 14   Highest education level: Not on file  Occupational History   Occupation: Retired  Tobacco Use   Smoking status: Former     Current packs/day: 0.00    Average packs/day: 0.5 packs/day for 30.0 years (15.0 ttl pk-yrs)    Types: Cigarettes    Start date: 12/05/1982    Quit date: 12/04/2012    Years since quitting: 10.9   Smokeless tobacco: Never  Vaping Use   Vaping status: Never Used  Substance and Sexual Activity   Alcohol use: No    Alcohol/week: 0.0 standard drinks of alcohol   Drug use: No   Sexual activity: Not Currently  Other Topics Concern   Not on file  Social History Narrative   Lives at home with his wife, Russell Padilla.   Right-handed.   4-5 cans diet colas and occasional glasses of tea per day.   Retired 2007.   Social Drivers of Corporate investment banker Strain: Not on file  Food Insecurity: Not on file  Transportation Needs: Not on file  Physical Activity: Not on file  Stress: Not on file  Social Connections: Unknown (02/14/2022)   Received from [facility]   Social Network    Social Network: Not on file  Intimate Partner Violence: Unknown (01/06/2022)   [facility], [facility]   HITS    Physically Hurt: Not on file    Insult or Talk Down To: Not on file    Threaten Physical Harm: Not on file    Scream or Curse: Not on file    FAMILY HISTORY: Family History  Problem Relation Age of Onset   Stomach cancer Mother    Congestive Heart Failure Father    Dementia Father    Heart disease Brother     ALLERGIES:  is allergic to crestor [rosuvastatin calcium], latex, and rosuvastatin calcium.  MEDICATIONS:  Current Outpatient Medications  Medication Sig Dispense Refill   acetaminophen (TYLENOL) 500 MG tablet Take 1,000 mg by mouth every 8 (eight) hours as needed for moderate pain.     adalimumab (HUMIRA-CD/UC/HS STARTER) 80 MG/0.8ML pen take 2 pens( 160mg ) on day one and 1 pen(80mg ) on day 15 Subcutaneous as directed for 15 days     aspirin 81 MG EC tablet Take 81 mg by mouth daily. Swallow whole.     Biotin (SUPER BIOTIN) 5 MG TABS Take 1 tablet by  mouth daily.     cholecalciferol (VITAMIN D3) 25 MCG (1000 UNIT) tablet Take 1,000 Units by mouth daily.     dicyclomine (BENTYL) 20 MG tablet Take 20 mg by mouth 4 (four) times daily as needed (IBS).      docusate sodium (COLACE) 100 MG capsule Take 100 mg by mouth daily.      Evolocumab (REPATHA SURECLICK) 140 MG/ML SOAJ Inject 140 mg into the skin every 14 (fourteen) days. 6 mL 3   fluticasone (FLONASE) 50 MCG/ACT nasal spray Place 1 spray into both nostrils at bedtime.      gabapentin (NEURONTIN) 300 MG capsule Take 1 tablet PO in the morning and take 2 tablets PO at bedtime. 270 capsule 3   Multiple Vitamins-Minerals (MULTIVITAMIN WITH MINERALS) tablet Take 1 tablet by mouth daily.     nitroGLYCERIN (NITROSTAT)  0.4 MG SL tablet Place 1 tablet (0.4 mg total) under the tongue every 5 (five) minutes as needed for chest pain (up to 3 doses). 25 tablet 5   olmesartan (BENICAR) 40 MG tablet Take 40 mg by mouth daily.     oxyCODONE-acetaminophen (PERCOCET/ROXICET) 5-325 MG per tablet Take 1 tablet by mouth every 4 (four) hours as needed for severe pain.      pantoprazole (PROTONIX) 40 MG tablet Take 40 mg by mouth 2 (two) times daily.      senna (SENOKOT) 8.6 MG TABS Take 1-2 tablets by mouth daily.      sildenafil (VIAGRA) 100 MG tablet Take 100 mg by mouth daily as needed for erectile dysfunction.     simvastatin (ZOCOR) 40 MG tablet Take 40 mg by mouth at bedtime.     sucralfate (CARAFATE) 1 GM/10ML suspension Take 1 g by mouth 3 (three) times daily as needed (spasms). With Lidocaine 2%  and Mylanta     traMADol (ULTRAM) 50 MG tablet Take 50 mg by mouth every 6 (six) hours as needed for moderate pain.     No current facility-administered medications for this visit.    REVIEW OF SYSTEMS:   All relevant systems were reviewed with the patient and are negative.  PHYSICAL EXAMINATION: ECOG PERFORMANCE STATUS: {CHL ONC ECOG PS:[id number]}  There were no vitals filed for this visit. There were  no vitals filed for this visit.  GENERAL: alert, no distress and comfortable SKIN: skin color normal EYES: normal conjunctiva, sclera clear LUNGS: normal breathing effort HEART: regular rate & rhythm ABDOMEN: abdomen soft, non-tender and nondistended  RADIOGRAPHIC STUDIES: I have personally reviewed the radiological images as listed and agreed with the findings in the report. VAS [place] LOWER EXTREMITY VENOUS REFLUX Result Date: 11/08/2023  Lower Venous Reflux Study Patient Name:  REEVE TURNLEY Lanzer  Date of Exam:   11/08/2023 Medical Rec #: [id number]       Accession #:    [id number] Date of Birth: 10/22/1955       Patient Gender: M Patient Age:   68 years Exam Location:  [facility] Procedure:      VAS [place] LOWER EXTREMITY VENOUS REFLUX Referring Phys: Ivin Booty ROBINS --------------------------------------------------------------------------------  Indications: Pain, Swelling, and Edema.  Risk Factors: Venous Insufficiency. Performing Technologist: Criss Rosales RVT  Examination Guidelines: A complete evaluation includes B-mode imaging, spectral Doppler, color Doppler, and power Doppler as needed of all accessible portions of each vessel. Bilateral testing is considered an integral part of a complete examination. Limited examinations for reoccurring indications may be performed as noted. The reflux portion of the exam is performed with the patient in reverse Trendelenburg. Significant venous reflux is defined as >500 ms in the superficial venous system, and >1 second in the deep venous system.  +--------------+---------+------+-----------+------------+--------+ LEFT          Reflux NoRefluxReflux TimeDiameter cmsComments                         Yes                                  +--------------+---------+------+-----------+------------+--------+ CFV                     yes   >1 second                      +--------------+---------+------+-----------+------------+--------+  FV mid                   yes   >1 second                      +--------------+---------+------+-----------+------------+--------+ GSV at [facility]              yes    >500 ms      0.70             +--------------+---------+------+-----------+------------+--------+ GSV prox thigh          yes    >500 ms      0.20             +--------------+---------+------+-----------+------------+--------+ GSV mid thigh no                            0.30             +--------------+---------+------+-----------+------------+--------+ GSV dist thighno                            0.35             +--------------+---------+------+-----------+------------+--------+ GSV at knee             yes    >500 ms      0.20             +--------------+---------+------+-----------+------------+--------+ GSV prox calf           yes    >500 ms      0.20             +--------------+---------+------+-----------+------------+--------+ GSV mid calf            yes    >500 ms      0.20             +--------------+---------+------+-----------+------------+--------+ SSV prox calf no                            0.20             +--------------+---------+------+-----------+------------+--------+  Summary: Left: - No evidence of deep vein thrombosis seen in the left lower extremity, from the common femoral through the popliteal veins. - No evidence of superficial venous thrombosis in the left lower extremity. - Venous reflux is noted in the left common femoral and femoral veins. - Venous reflux is noted in the left sapheno-femoral junction. - Venous reflux is noted in the left greater saphenous vein in the thigh. - Venous reflux is noted in the left greater saphenous vein in the calf.  *See table(s) above for measurements and observations. Electronically signed by Lemar Livings MD on 11/08/2023 at 3:43:48 PM.    Final

## 2023-11-21 ENCOUNTER — Inpatient Hospital Stay: Payer: Medicare Other

## 2023-11-21 ENCOUNTER — Telehealth: Payer: Self-pay

## 2023-11-21 VITALS — BP 105/69 | HR 79 | Temp 97.7°F | Resp 17 | Wt 252.2 lb

## 2023-11-21 DIAGNOSIS — Z800 Family history of malignant neoplasm of digestive organs: Secondary | ICD-10-CM | POA: Diagnosis not present

## 2023-11-21 DIAGNOSIS — K529 Noninfective gastroenteritis and colitis, unspecified: Secondary | ICD-10-CM | POA: Insufficient documentation

## 2023-11-21 DIAGNOSIS — D509 Iron deficiency anemia, unspecified: Secondary | ICD-10-CM | POA: Diagnosis present

## 2023-11-21 DIAGNOSIS — Z7952 Long term (current) use of systemic steroids: Secondary | ICD-10-CM | POA: Insufficient documentation

## 2023-11-21 DIAGNOSIS — Z87891 Personal history of nicotine dependence: Secondary | ICD-10-CM | POA: Diagnosis not present

## 2023-11-21 DIAGNOSIS — D500 Iron deficiency anemia secondary to blood loss (chronic): Secondary | ICD-10-CM

## 2023-11-21 NOTE — Telephone Encounter (Signed)
 Dr. Cherly Hensen, patient will be scheduled as soon as possible.  Auth Submission: NO AUTH NEEDED Site of care: Site of care: [facility] Payer: [employer] medicare Medication & CPT/J Code(s) submitted: Feraheme (ferumoxytol) [id number] Route of submission (phone, fax, portal): portal Phone # Fax # Auth type: Buy/Bill PB Units/visits requested: 510mg  x 2 doses Reference number: [id number] Approval from: 11/21/23 to 05/20/24

## 2023-11-22 ENCOUNTER — Ambulatory Visit: Payer: Medicare Other

## 2023-11-22 VITALS — BP 121/70 | HR 53 | Temp 97.7°F | Resp 18 | Ht 72.0 in | Wt 251.8 lb

## 2023-11-22 DIAGNOSIS — D500 Iron deficiency anemia secondary to blood loss (chronic): Secondary | ICD-10-CM

## 2023-11-22 DIAGNOSIS — K529 Noninfective gastroenteritis and colitis, unspecified: Secondary | ICD-10-CM

## 2023-11-22 MED ORDER — DIPHENHYDRAMINE HCL 25 MG PO CAPS
25.0000 mg | ORAL_CAPSULE | Freq: Once | ORAL | Status: AC
  Administered 2023-11-22: 25 mg via ORAL
  Filled 2023-11-22: qty 1

## 2023-11-22 MED ORDER — ACETAMINOPHEN 325 MG PO TABS
650.0000 mg | ORAL_TABLET | Freq: Once | ORAL | Status: AC
  Administered 2023-11-22: 650 mg via ORAL
  Filled 2023-11-22: qty 2

## 2023-11-22 MED ORDER — SODIUM CHLORIDE 0.9 % IV SOLN
510.0000 mg | Freq: Once | INTRAVENOUS | Status: AC
  Administered 2023-11-22: 510.003 mg via INTRAVENOUS
  Filled 2023-11-22: qty 17

## 2023-11-22 NOTE — Telephone Encounter (Signed)
 Patient scheduled.

## 2023-11-22 NOTE — Progress Notes (Signed)
 Diagnosis: Iron Deficiency Anemia  Provider:  Chilton Greathouse MD  Procedure: IV Infusion  IV Type: Peripheral, IV Location: L Forearm  Feraheme (Ferumoxytol), Dose: 510 mg  Infusion Start Time: 1043  Infusion Stop Time: 1059  Post Infusion IV Care: Observation period completed and Peripheral IV Discontinued  Discharge: Condition: Good, Destination: Home . AVS Provided  Performed by:  Garnette Czech, RN

## 2023-11-27 ENCOUNTER — Ambulatory Visit (HOSPITAL_COMMUNITY)
Admission: RE | Admit: 2023-11-27 | Discharge: 2023-11-27 | Disposition: A | Discharge status: Home / Self Care | Payer: Medicare Other | Source: Ambulatory Visit | Attending: Physician Assistant | Admitting: Physician Assistant

## 2023-11-27 DIAGNOSIS — R072 Precordial pain: Secondary | ICD-10-CM | POA: Insufficient documentation

## 2023-11-27 LAB — ECHOCARDIOGRAM COMPLETE
AR max vel: 2.58 cm2
AV Area VTI: 2.81 cm2
AV Area mean vel: 2.59 cm2
AV Mean grad: 7 mmHg
AV Peak grad: 13.7 mmHg
Ao pk vel: 1.85 m/s
Area-P 1/2: 3.17 cm2
S' Lateral: 3.6 cm

## 2023-11-27 NOTE — Progress Notes (Signed)
*  PRELIMINARY RESULTS* Echocardiogram 2D Echocardiogram has been performed.  Stacey Drain 11/27/2023, 4:01 PM

## 2023-11-28 ENCOUNTER — Telehealth: Payer: Self-pay | Admitting: *Deleted

## 2023-11-28 DIAGNOSIS — M7989 Other specified soft tissue disorders: Secondary | ICD-10-CM

## 2023-11-28 DIAGNOSIS — R635 Abnormal weight gain: Secondary | ICD-10-CM

## 2023-11-28 NOTE — Telephone Encounter (Signed)
-----   Message from Laurann Montana sent at 11/28/2023 11:45 AM EST ----- Per verbal discussion with Russell Padilla patient reported his edema is about the same. Given the ongoing weight gain, would like him to return for BNP, BMET when he can so we can guide whether to trial a little low dose Lasix for symptom relief.

## 2023-11-29 ENCOUNTER — Ambulatory Visit (INDEPENDENT_AMBULATORY_CARE_PROVIDER_SITE_OTHER): Payer: Medicare Other

## 2023-11-29 VITALS — BP 129/78 | HR 58 | Temp 97.8°F | Resp 20 | Ht 70.5 in | Wt 256.4 lb

## 2023-11-29 DIAGNOSIS — D500 Iron deficiency anemia secondary to blood loss (chronic): Secondary | ICD-10-CM | POA: Diagnosis not present

## 2023-11-29 DIAGNOSIS — K529 Noninfective gastroenteritis and colitis, unspecified: Secondary | ICD-10-CM | POA: Diagnosis not present

## 2023-11-29 LAB — BRAIN NATRIURETIC PEPTIDE: BNP: 130.7 pg/mL — ABNORMAL HIGH (ref 0.0–100.0)

## 2023-11-29 LAB — BASIC METABOLIC PANEL
BUN/Creatinine Ratio: 9 — ABNORMAL LOW (ref 10–24)
BUN: 11 mg/dL (ref 8–27)
CO2: 24 mmol/L (ref 20–29)
Calcium: 9.2 mg/dL (ref 8.6–10.2)
Chloride: 107 mmol/L — ABNORMAL HIGH (ref 96–106)
Creatinine, Ser: 1.25 mg/dL (ref 0.76–1.27)
Glucose: 123 mg/dL — ABNORMAL HIGH (ref 70–99)
Potassium: 4.7 mmol/L (ref 3.5–5.2)
Sodium: 143 mmol/L (ref 134–144)
eGFR: 63 mL/min/{1.73_m2} (ref >59)

## 2023-11-29 MED ORDER — DIPHENHYDRAMINE HCL 25 MG PO CAPS
25.0000 mg | ORAL_CAPSULE | Freq: Once | ORAL | Status: AC
Start: 2023-11-29 — End: 2023-11-29
  Administered 2023-11-29: 25 mg via ORAL
  Filled 2023-11-29: qty 1

## 2023-11-29 MED ORDER — SODIUM CHLORIDE 0.9 % IV SOLN
510.0000 mg | Freq: Once | INTRAVENOUS | Status: AC
  Administered 2023-11-29: 510 mg via INTRAVENOUS
  Filled 2023-11-29: qty 17

## 2023-11-29 MED ORDER — ACETAMINOPHEN 325 MG PO TABS
650.0000 mg | ORAL_TABLET | Freq: Once | ORAL | Status: AC
  Administered 2023-11-29: 650 mg via ORAL
  Filled 2023-11-29: qty 2

## 2023-11-29 NOTE — Progress Notes (Signed)
 Diagnosis: Acute Anemia  Provider:  Chilton Greathouse MD  Procedure: IV Infusion  IV Type: Peripheral, IV Location: L Antecubital  Feraheme (Ferumoxytol), Dose: 510 mg  Infusion Start Time: 1540  Infusion Stop Time: 1557  Post Infusion IV Care: Patient declined observation and Peripheral IV Discontinued  Discharge: Condition: Good, Destination: Home . AVS Provided  Performed by:  Nat Math, RN

## 2023-11-30 ENCOUNTER — Telehealth: Payer: Self-pay | Admitting: *Deleted

## 2023-11-30 DIAGNOSIS — I10 Essential (primary) hypertension: Secondary | ICD-10-CM

## 2023-11-30 MED ORDER — FUROSEMIDE 20 MG PO TABS: 20.0000 mg | ORAL_TABLET | Freq: Every day | ORAL | 11 refills | Status: DC

## 2023-11-30 NOTE — Telephone Encounter (Signed)
 Pt reports that his tongue is sore and he has been feeling fatigue since November. Encouraged pt to reach out to GI. Please advise.

## 2023-11-30 NOTE — Telephone Encounter (Signed)
-----   Message from Laurann Montana sent at 11/29/2023  4:57 PM EST ----- Please let pt know labs are stable. BNP number is just marginally up. I again think most of his swelling is related to his steroid treatment but I want to try to help with symptoms. Let's start Lasix 20mg  daily to see if we can help with his edema. If he finds his swelling goes completely away, he can change to PRN only and give [place] that update. Would recheck BMET 1 week after starting. Please have him check in to see how he is feeling in 2 weeks. Was supposed to schedule f/u with [place] in 6 weeks, please offer to schedule. Thank you.

## 2023-12-05 NOTE — Telephone Encounter (Signed)
 Pt notified of D. Dunn's response via voicemail ( OK per DPR).

## 2023-12-11 NOTE — Telephone Encounter (Signed)
 Did not see that pt went for f/u BMET. Just confirming that he had gotten original message below to start Lasix and f/u BMET 1 week after from last month. Thank you!

## 2023-12-11 NOTE — Telephone Encounter (Signed)
 Spoke with pt who states that he did start taking Lasix but did not know to have labs done. Pt states that he will go have labs done at next available time.

## 2023-12-15 ENCOUNTER — Telehealth: Payer: Self-pay

## 2023-12-15 DIAGNOSIS — Z79899 Other long term (current) drug therapy: Secondary | ICD-10-CM

## 2023-12-15 LAB — BASIC METABOLIC PANEL
BUN/Creatinine Ratio: 7 — ABNORMAL LOW (ref 10–24)
BUN: 11 mg/dL (ref 8–27)
CO2: 26 mmol/L (ref 20–29)
Calcium: 9 mg/dL (ref 8.6–10.2)
Chloride: 104 mmol/L (ref 96–106)
Creatinine, Ser: 1.47 mg/dL — ABNORMAL HIGH (ref 0.76–1.27)
Glucose: 95 mg/dL (ref 70–99)
Potassium: 4.2 mmol/L (ref 3.5–5.2)
Sodium: 145 mmol/L — ABNORMAL HIGH (ref 134–144)
eGFR: 52 mL/min/{1.73_m2} — ABNORMAL LOW (ref >59)

## 2023-12-15 MED ORDER — FUROSEMIDE 20 MG PO TABS
20.0000 mg | ORAL_TABLET | ORAL | 6 refills | Status: AC | PRN
Start: 2023-12-15 — End: ?

## 2023-12-15 NOTE — Telephone Encounter (Signed)
 The patient has been notified of the result and verbalized understanding.  All questions (if any) were answered. Roseanne Reno, CMA 12/15/2023 10:20 AM

## 2023-12-15 NOTE — Telephone Encounter (Signed)
-----   Message from Laurann Montana sent at 12/15/2023  7:55 AM EDT ----- Please let pt know that labs show that kidney function has risen with the furosemide/Lasix. It's not wildly different than his prior baseline, but please find out how swelling is doing. If improved, stop Lasix altogether and use only PRN swelling going forward. If still diuresing, would decrease dose to 2x/week. Either way would get BMET 1 week to trend.

## 2024-01-18 ENCOUNTER — Telehealth: Payer: Self-pay

## 2024-01-18 ENCOUNTER — Other Ambulatory Visit: Payer: Self-pay

## 2024-01-18 DIAGNOSIS — D500 Iron deficiency anemia secondary to blood loss (chronic): Secondary | ICD-10-CM

## 2024-01-18 NOTE — Telephone Encounter (Addendum)
 Faxed orders for CBC, Iron and TIBC, Ferritin, Folate, and Vit B12 to [facility] [phone]) per pt's and Dr. Joseph Nickel request. Asked for results to be faxed back to Dr. Joseph Nickel office at [phone]. Received confirmation of receipt.

## 2024-01-23 ENCOUNTER — Telehealth: Payer: Self-pay | Admitting: *Deleted

## 2024-01-23 ENCOUNTER — Encounter: Payer: Self-pay | Admitting: *Deleted

## 2024-01-23 DIAGNOSIS — Z79899 Other long term (current) drug therapy: Secondary | ICD-10-CM

## 2024-01-23 NOTE — Telephone Encounter (Signed)
 Spoke with Russell Padilla a [facility]. She states that she believes that we can add a BMP to the labs drawn on yesterday. Verbal order given and order placed in epic. [facility] will fax a req to be filled out and signed.

## 2024-01-23 NOTE — Progress Notes (Unsigned)
 Cardiology Office Note:  .   Date:  02/05/2024  ID:  Russell Padilla, DOB Oct 08, 1955, MRN [id number] PCP: Rae Bugler, MD  [facility] Providers Cardiologist:  Antoinette Batman, MD    History of Present Illness: .   Russell Padilla is a 68 y.o. male  history of CAD s/p DES to OM2 12/2012 with residual nonobstructive disease, HTN, HLD, IBD, esophageal stricture, chronic neck/shoulder pain, mild carotid disease, arthritis, renal calculi, progressive anemia with iron deficiency   Last cath 03/2015 (prompted by abnormal stress test) showed patent OM stent, severe stenosis of small sub-branch of OM2 jailed by stent and unchanged, moderate disease in small non-dominant RCA, chronic occlusion of OM1 with filling from left to left collaterals. Last carotid duplex 09/2022 showed near-normal with only minimal thickening.   Patient saw [name] PA-C 11/2023 with atypical chest/throat pain. She ordered echo and cardiac PET. Echo ordered for LE edema-felt due to prednisone . Echo normal LVEF 60-65% mild LVH grade 1DD. Patient is having a lot of trouble with Chron's disease and sees GI tomorrow. Russell spoke with Dr. Abel Hoe and recommend cardiac cath. He's been unable to do any exercise or leave his home due to Chron's disease. Has a lot of anxiety since his wife died. Denies chest pain. Has occasional throat pain but also has acid reflux and food gets caught. Has DOE and fatigue walking short distance. ENT didn't find anything. BP running very low. Not eating or drinking well. Had a can of soup yesterday.   ROS:    Studies Reviewed: [name]         Prior CV Studies:   PET CT 01/31/24   Findings are consistent with circumflex ischemia (and associated stress flows of 1.34). The study is high risk.  Though territory of ischemia is low, presence of low myocardial blood flow reserve (1.20) is a high risk feature.  This is a less specific finding in those with prior intervention.   LV perfusion is abnormal.  There is evidence of ischemia. There is no evidence of infarction. Defect 1: There is a medium defect with moderate reduction in uptake present in the apical to basal lateral location(s) that is reversible. There is abnormal wall motion in the defect area. Consistent with ischemia. The defect is consistent with abnormal perfusion in the LCx territory.   Rest left ventricular function is normal. Stress left ventricular function is normal. End diastolic cavity size is normal.   Coronary calcium  assessment not performed due to prior revascularization. Aortic atherosclerosis.  Pulmonary artery dilation.   Electronically Signed  By: Gloriann Larger M.D.   Echo 11/2023 IMPRESSIONS     1. Left ventricular ejection fraction, by estimation, is 60 to 65%. The  left ventricle has normal function. The left ventricle has no regional  wall motion abnormalities. The left ventricular internal cavity size was  mildly dilated. There is mild left  ventricular hypertrophy. Left ventricular diastolic parameters are  consistent with Grade I diastolic dysfunction (impaired relaxation).   2. Right ventricular systolic function is normal. The right ventricular  size is normal.   3. Left atrial size was mildly dilated.   4. Right atrial size was mildly dilated.   5. The mitral valve is normal in structure. Trivial mitral valve  regurgitation.   6. The aortic valve is normal in structure. Aortic valve regurgitation is  not visualized.   7. The inferior vena cava is normal in size with greater than 50%  respiratory variability, suggesting right  atrial pressure of 3 mmHg.    Risk Assessment/Calculations:             Physical Exam:   VS:  BP (!) 100/56 (BP Location: Left Arm, Patient Position: Sitting, Cuff Size: Large)   Pulse 86   Ht 5\' 10"  (1.778 m)   Wt 242 lb 6.4 oz (110 kg)   SpO2 95%   BMI 34.78 kg/m    Wt Readings from Last 3 Encounters:  02/05/24 242 lb 6.4 oz (110 kg)  01/29/24 239 lb 12.8  oz (108.8 kg)  11/29/23 256 lb 6.4 oz (116.3 kg)    GEN: Well nourished, well developed in no acute distress NECK: No JVD; No carotid bruits CARDIAC:  RRR, no murmurs, rubs, gallops RESPIRATORY:  Clear to auscultation without rales, wheezing or rhonchi  ABDOMEN: Soft, non-tender, non-distended EXTREMITIES:  trace  edema; No deformity   ASSESSMENT AND PLAN: .    CAD, status post DES to the OM 2 12/2012 with residual nonobstructive disease, normal LVEF on echo 2014, repeat cath 01/2013 patent stent .  Intermediate risk NST 2016 repeat cath stable CAD CTO of OM1 with left-to-right collaterals mild LAD stenosis.  PET CT: high risk with ischemia in Cfx. Russell Dunn PA-C discussed in detail with Dr. Paulita Boss and Dr. Abel Hoe who recommend a Cardiac cath  to further access. Patient agreeable. Crt 1.47 12/14/23.  I have reviewed the risks, indications, and alternatives to angioplasty and stenting with the patient. Risks include but are not limited to bleeding, infection, vascular injury, stroke, myocardial infection, arrhythmia, kidney injury, radiation-related injury in the case of prolonged fluoroscopy use, emergency cardiac surgery, and death. The patient understands the risks of serious complication is low (<1%) and patient agrees to proceed.    LE edema felt due to steroids-echo normal LVEF, mild LVH, G1DD-2 gm sodium diet recommended  Hypertension-BP running low on olmesartan  10 mg daily. Suspect due to poor diet/water intake from Chron's. Will decrease to 20 mg 1/2 tablet daily.  HLD LDL 22 09/2023  Mild carotid stenosis on Dopplers 01/2022  Chron's disease has been debilitating for him to a point he can't leave his home. He sees GI tomorrow.         Dispo: f/u after cath.  Signed, Theotis Flake, PA-C

## 2024-01-23 NOTE — H&P (View-Only) (Signed)
 Cardiology Office Note:  .   Date:  02/05/2024  ID:  [name], DOB 1956-05-20, MRN [id number] PCP: Rae Bugler, MD  [facility] Providers Cardiologist:  Antoinette Batman, MD    History of Present Illness: .   Russell Padilla is a 68 y.o. male  history of CAD s/p DES to OM2 12/2012 with residual nonobstructive disease, HTN, HLD, IBD, esophageal stricture, chronic neck/shoulder pain, mild carotid disease, arthritis, renal calculi, progressive anemia with iron deficiency   Last cath 03/2015 (prompted by abnormal stress test) showed patent OM stent, severe stenosis of small sub-branch of OM2 jailed by stent and unchanged, moderate disease in small non-dominant RCA, chronic occlusion of OM1 with filling from left to left collaterals. Last carotid duplex 09/2022 showed near-normal with only minimal thickening.   Patient saw [name] PA-C 11/2023 with atypical chest/throat pain. She ordered echo and cardiac PET. Echo ordered for LE edema-felt due to prednisone . Echo normal LVEF 60-65% mild LVH grade 1DD. Patient is having a lot of trouble with Chron's disease and sees GI tomorrow. [name] spoke with Dr. Abel Hoe and recommend cardiac cath. He's been unable to do any exercise or leave his home due to Chron's disease. Has a lot of anxiety since his wife died. Denies chest pain. Has occasional throat pain but also has acid reflux and food gets caught. Has DOE and fatigue walking short distance. ENT didn't find anything. BP running very low. Not eating or drinking well. Had a can of soup yesterday.   ROS:    Studies Reviewed: [name]         Prior CV Studies:   PET CT 01/31/24   Findings are consistent with circumflex ischemia (and associated stress flows of 1.34). The study is high risk.  Though territory of ischemia is low, presence of low myocardial blood flow reserve (1.20) is a high risk feature.  This is a less specific finding in those with prior intervention.   LV perfusion is abnormal.  There is evidence of ischemia. There is no evidence of infarction. Defect 1: There is a medium defect with moderate reduction in uptake present in the apical to basal lateral location(s) that is reversible. There is abnormal wall motion in the defect area. Consistent with ischemia. The defect is consistent with abnormal perfusion in the LCx territory.   Rest left ventricular function is normal. Stress left ventricular function is normal. End diastolic cavity size is normal.   Coronary calcium  assessment not performed due to prior revascularization. Aortic atherosclerosis.  Pulmonary artery dilation.   Electronically Signed  By: Gloriann Larger M.D.   Echo 11/2023 IMPRESSIONS     1. Left ventricular ejection fraction, by estimation, is 60 to 65%. The  left ventricle has normal function. The left ventricle has no regional  wall motion abnormalities. The left ventricular internal cavity size was  mildly dilated. There is mild left  ventricular hypertrophy. Left ventricular diastolic parameters are  consistent with Grade I diastolic dysfunction (impaired relaxation).   2. Right ventricular systolic function is normal. The right ventricular  size is normal.   3. Left atrial size was mildly dilated.   4. Right atrial size was mildly dilated.   5. The mitral valve is normal in structure. Trivial mitral valve  regurgitation.   6. The aortic valve is normal in structure. Aortic valve regurgitation is  not visualized.   7. The inferior vena cava is normal in size with greater than 50%  respiratory variability, suggesting right  atrial pressure of 3 mmHg.    Risk Assessment/Calculations:             Physical Exam:   VS:  BP (!) 100/56 (BP Location: Left Arm, Patient Position: Sitting, Cuff Size: Large)   Pulse 86   Ht 5\' 10"  (1.778 m)   Wt 242 lb 6.4 oz (110 kg)   SpO2 95%   BMI 34.78 kg/m    Wt Readings from Last 3 Encounters:  02/05/24 242 lb 6.4 oz (110 kg)  01/29/24 239 lb 12.8  oz (108.8 kg)  11/29/23 256 lb 6.4 oz (116.3 kg)    GEN: Well nourished, well developed in no acute distress NECK: No JVD; No carotid bruits CARDIAC:  RRR, no murmurs, rubs, gallops RESPIRATORY:  Clear to auscultation without rales, wheezing or rhonchi  ABDOMEN: Soft, non-tender, non-distended EXTREMITIES:  trace  edema; No deformity   ASSESSMENT AND PLAN: .    CAD, status post DES to the OM 2 12/2012 with residual nonobstructive disease, normal LVEF on echo 2014, repeat cath 01/2013 patent stent .  Intermediate risk NST 2016 repeat cath stable CAD CTO of OM1 with left-to-right collaterals mild LAD stenosis.  PET CT: high risk with ischemia in Cfx. [name] Dunn PA-C discussed in detail with Dr. Paulita Boss and Dr. Abel Hoe who recommend a Cardiac cath  to further access. Patient agreeable. Crt 1.47 12/14/23.  I have reviewed the risks, indications, and alternatives to angioplasty and stenting with the patient. Risks include but are not limited to bleeding, infection, vascular injury, stroke, myocardial infection, arrhythmia, kidney injury, radiation-related injury in the case of prolonged fluoroscopy use, emergency cardiac surgery, and death. The patient understands the risks of serious complication is low (<1%) and patient agrees to proceed.    LE edema felt due to steroids-echo normal LVEF, mild LVH, G1DD-2 gm sodium diet recommended  Hypertension-BP running low on olmesartan  10 mg daily. Suspect due to poor diet/water intake from Chron's. Will decrease to 20 mg 1/2 tablet daily.  HLD LDL 22 09/2023  Mild carotid stenosis on Dopplers 01/2022  Chron's disease has been debilitating for him to a point he can't leave his home. He sees GI tomorrow.         Dispo: f/u after cath.  Signed, Theotis Flake, PA-C

## 2024-01-23 NOTE — Telephone Encounter (Signed)
-----   Message from Dorma Gash sent at 01/23/2024  8:29 AM EDT ----- Covering for [name]. She had previously requested a BMET but it does not appear this was obtained by his PCP with recent labs. Could see if it can be added on since just checked yesterday at [facility]. If not, would recheck when able to make sure renal function has improved with prior adjustment of Lasix .

## 2024-01-26 NOTE — Telephone Encounter (Signed)
 Message sent to provider

## 2024-01-26 NOTE — Progress Notes (Signed)
 [facility] CONSULT NOTE  Patient Care Team: Rae Bugler, MD as PCP - General (Family Medicine) Odie Benne, MD as PCP - Cardiology (Cardiology)  ASSESSMENT & PLAN:  68 y.o. male with history of CAD with stent, hypertension, hyperlipidemia, IBD, dysphagia being seen for iron deficiency anemia.  IDA from IBD. He still has symptoms of diarrhea but bloody stool mostly resolved. Report still having struggles with symptoms of IBD.  He has been on Humira since Feb.  He has active symptoms and is still on prednisone  taper.     Repeat labs on 01/22/24 showed hgb 11.9. MCV 86. Iron saturation of 13%. Ferritin 99. B12 was 507 and folate >20.  Relevant history: IBD Last colonoscopy: 2024 Last EGD: 2024  Discussed results. Repeat labs in about 3 months. Nurse Tammi faxed the orders of CBC, ferritin & b12 to his [facility]. Also printed out a copy for him to take to them  Other iron deficiency anemia Assessment & Plan:  CBC, ferritin, and b12 about a week before next visit at the end of July at [facility]   All questions were answered. The patient knows to call the clinic with any problems, questions or concerns.  Lowanda Ruddy, MD 4/28/20254:55 PM   CHIEF COMPLAINTS/PURPOSE OF CONSULTATION:  Anemia  HISTORY OF PRESENTING ILLNESS:  Interval history showed resolution of blood stool but has diarrhea, bloating still having difficulty with control of bowel movement.   Initial history MUAAD BOEHNING 68 y.o. male is here because of anemia.  Per outside records, patient was diagnosed of inflammatory bowel disease.  Report he underwent EGD and colonoscopy in 10/2022 with findings of antral erythema, mild stenosis in the lower third of esophagus status post dilation.  Erythema was noted in the descending colon and sigmoid colon and he had a biopsy.  Terminal ileum biopsy show small bowel mucosa with no significant pathologic findings.  Left colon showed mild active chronic  colitis negative for dysplasia.  He was started mesalamine.  Fecal calprotectin was elevated in the 11/2022.  Report of no improvement by 03/2023.  Fecal calprotectin increased to 23.  He was started on prednisone  taper.  Adalimumab was recommended. Per report it was approved in September 2024.  Records report that he had bloody bowel movement.  He has been on prednisone  taper to this date.  10/13/2023 WBC 7.3 hemoglobin 10.3 MCV 79 RDW 14.7 platelet 190.  Ferritin 10 iron saturation 5%  He feels he has no energy. No craving for ice craving.  Danuel had not noticed any hematuria.  He denies blood donation or received blood transfusion in the recent years.   MEDICAL HISTORY:  Past Medical History:  Diagnosis Date   Abdominal pain 07-11-13   ongoing and unspecified   Arthritis    Bladder stone 07-11-13   surgery planned   CAD (coronary artery disease)    a. 12/2012 Cath/PCI: DES to superior branch of OM2;  b. Repeat cath 4/14 with 20% LAD, patent stent OM3, 70% diffuse stenosis small, non-dominant RCA.   Essential hypertension    Headache    HLD (hyperlipidemia)    Renal disorder    renal calculi   Stomach pain    chronic    SURGICAL HISTORY: Past Surgical History:  Procedure Laterality Date   BACK SURGERY     2 cervical/ 2 lumbar fusions   CARDIAC CATHETERIZATION N/A 03/04/2015   Procedure: Left Heart Cath and Coronary Angiography;  Surgeon: Odie Benne, MD;  Location: [facility] INVASIVE CV LAB;  Service: Cardiovascular;  Laterality: N/A;   CHOLECYSTECTOMY     laparoscopic.   CORONARY STENT PLACEMENT     3'14   ESOPHAGOGASTRODUODENOSCOPY ENDOSCOPY     multiple times-[facility]/ and now being followed by [facility]- [facility]   KNEE ARTHROSCOPY Left    '80's   LEFT HEART CATHETERIZATION WITH CORONARY ANGIOGRAM N/A 12/05/2012   Procedure: LEFT HEART CATHETERIZATION WITH CORONARY ANGIOGRAM;  Surgeon: Darrold Emms, MD;  Location: [facility] CATH LAB;  Service: Cardiovascular;  Laterality:  N/A;   LEFT HEART CATHETERIZATION WITH CORONARY ANGIOGRAM N/A 01/17/2013   Procedure: LEFT HEART CATHETERIZATION WITH CORONARY ANGIOGRAM;  Surgeon: Wenona Hamilton, MD;  Location: [facility] CATH LAB;  Service: Cardiovascular;  Laterality: N/A;   PERCUTANEOUS CORONARY STENT INTERVENTION (PCI-S) N/A 12/06/2012   Procedure: PERCUTANEOUS CORONARY STENT INTERVENTION (PCI-S);  Surgeon: Odie Benne, MD;  Location: [facility] CATH LAB;  Service: Cardiovascular;  Laterality: N/A;   ALLERGIES:  is allergic to crestor [rosuvastatin calcium ], latex, and rosuvastatin calcium .  MEDICATIONS:  Current Outpatient Medications  Medication Sig Dispense Refill   ketoconazole (NIZORAL) 2 % cream Apply 1 Application topically daily.     triamcinolone  cream (KENALOG ) 0.1 % Apply 1 Application topically 2 (two) times daily.     acetaminophen  (TYLENOL ) 500 MG tablet Take 1,000 mg by mouth every 8 (eight) hours as needed for moderate pain. (Patient not taking: Reported on 11/21/2023)     adalimumab (HUMIRA-CD/UC/HS STARTER) 80 MG/0.8ML pen take 2 pens( 160mg ) on day one and 1 pen(80mg ) on day 15 Subcutaneous as directed for 15 days     aspirin  81 MG EC tablet Take 81 mg by mouth daily. Swallow whole.     Biotin (SUPER BIOTIN) 5 MG TABS Take 1 tablet by mouth daily.     cholecalciferol (VITAMIN D3) 25 MCG (1000 UNIT) tablet Take 1,000 Units by mouth daily.     dicyclomine  (BENTYL ) 20 MG tablet Take 20 mg by mouth 4 (four) times daily as needed (IBS).      docusate sodium  (COLACE) 100 MG capsule Take 100 mg by mouth daily.  (Patient not taking: Reported on 11/21/2023)     doxycycline (VIBRA-TABS) 100 MG tablet Take by mouth.     Evolocumab  (REPATHA  SURECLICK) 140 MG/ML SOAJ Inject 140 mg into the skin every 14 (fourteen) days. 6 mL 3   fluticasone  (FLONASE ) 50 MCG/ACT nasal spray Place 1 spray into both nostrils at bedtime.      furosemide  (LASIX ) 20 MG tablet Take 1 tablet (20 mg total) by mouth as needed. 30 tablet 6    gabapentin  (NEURONTIN ) 300 MG capsule Take 1 tablet PO in the morning and take 2 tablets PO at bedtime. 270 capsule 3   mesalamine (LIALDA) 1.2 g EC tablet      Multiple Vitamins-Minerals (MULTIVITAMIN WITH MINERALS) tablet Take 1 tablet by mouth daily.     nitroGLYCERIN  (NITROSTAT ) 0.4 MG SL tablet Place 1 tablet (0.4 mg total) under the tongue every 5 (five) minutes as needed for chest pain (up to 3 doses). (Patient not taking: Reported on 11/21/2023) 25 tablet 5   olmesartan  (BENICAR ) 40 MG tablet Take 40 mg by mouth daily.     oxyCODONE -acetaminophen  (PERCOCET/ROXICET) 5-325 MG per tablet Take 1 tablet by mouth every 4 (four) hours as needed for severe pain.  (Patient not taking: Reported on 11/21/2023)     pantoprazole  (PROTONIX ) 40 MG tablet Take 40 mg by mouth 2 (two) times daily.  predniSONE  (DELTASONE ) 10 MG tablet Take 1 tablet by mouth daily.     senna (SENOKOT) 8.6 MG TABS Take 1-2 tablets by mouth daily.  (Patient not taking: Reported on 11/21/2023)     sildenafil (VIAGRA) 100 MG tablet Take 100 mg by mouth daily as needed for erectile dysfunction. (Patient not taking: Reported on 11/21/2023)     simvastatin  (ZOCOR ) 40 MG tablet Take 40 mg by mouth at bedtime.     sucralfate  (CARAFATE ) 1 GM/10ML suspension Take 1 g by mouth 3 (three) times daily as needed (spasms). With Lidocaine  2%  and Mylanta     traMADol (ULTRAM) 50 MG tablet Take 50 mg by mouth every 6 (six) hours as needed for moderate pain.     No current facility-administered medications for this visit.    REVIEW OF SYSTEMS:   All relevant systems were reviewed with the patient and are negative.  PHYSICAL EXAMINATION:  Vitals:   01/29/24 1539  BP: 125/73  Pulse: 82  Resp: 17  Temp: 97.9 F (36.6 C)  SpO2: 97%    Filed Weights   01/29/24 1539  Weight: 239 lb 12.8 oz (108.8 kg)    GENERAL: alert, no distress and comfortable SKIN: skin color normal. Facial rash. Rash over arms LUNGS: normal breathing  effort  Labs reviewed from outside records.

## 2024-01-26 NOTE — Telephone Encounter (Signed)
    This patient does NOT need a repeat BMET on our behalf today. Recent labs on 4/21 showed his creatinine had already improved to 1.35 (previously 1.47).   Signed, Dorma Gash, PA-C 01/26/2024, 1:12 PM Pager: [phone]

## 2024-01-26 NOTE — Telephone Encounter (Signed)
 Pt returning call to a nurse

## 2024-01-29 ENCOUNTER — Inpatient Hospital Stay: Payer: Medicare Other

## 2024-01-29 ENCOUNTER — Other Ambulatory Visit: Payer: Self-pay | Admitting: *Deleted

## 2024-01-29 ENCOUNTER — Other Ambulatory Visit: Payer: Self-pay

## 2024-01-29 VITALS — BP 125/73 | HR 82 | Temp 97.9°F | Resp 17 | Ht 70.0 in | Wt 239.8 lb

## 2024-01-29 DIAGNOSIS — Z79899 Other long term (current) drug therapy: Secondary | ICD-10-CM | POA: Insufficient documentation

## 2024-01-29 DIAGNOSIS — K589 Irritable bowel syndrome without diarrhea: Secondary | ICD-10-CM | POA: Insufficient documentation

## 2024-01-29 DIAGNOSIS — D508 Other iron deficiency anemias: Secondary | ICD-10-CM | POA: Insufficient documentation

## 2024-01-29 DIAGNOSIS — Z7952 Long term (current) use of systemic steroids: Secondary | ICD-10-CM | POA: Insufficient documentation

## 2024-01-29 DIAGNOSIS — D500 Iron deficiency anemia secondary to blood loss (chronic): Secondary | ICD-10-CM

## 2024-01-29 NOTE — Assessment & Plan Note (Signed)
  CBC, ferritin, and b12 about a week before next visit at the end of July at [facility]

## 2024-01-30 ENCOUNTER — Telehealth (HOSPITAL_COMMUNITY): Payer: Self-pay | Admitting: *Deleted

## 2024-01-30 ENCOUNTER — Telehealth: Payer: Self-pay | Admitting: *Deleted

## 2024-01-30 NOTE — Telephone Encounter (Signed)
 Lab orders for CBC with diff, Ferritin and B-12 faxed to [facility] at [phone]. Requested they fax results to Dr Joseph Nickel office @ [phone]

## 2024-01-30 NOTE — Telephone Encounter (Signed)
 Reaching out to patient to offer assistance regarding upcoming cardiac imaging study; pt verbalizes understanding of appt date/time, parking situation and where to check in, pre-test NPO status, and verified current allergies; name and call back number provided for further questions should they arise  Russell Copping RN Navigator Cardiac Imaging [facility] [phone] office [phone] cell  Patient aware to avoid caffeine 12 hours prior to his cardiac PET.

## 2024-01-31 ENCOUNTER — Ambulatory Visit (HOSPITAL_COMMUNITY)
Admission: RE | Admit: 2024-01-31 | Discharge: 2024-01-31 | Disposition: A | Discharge status: Home / Self Care | Payer: Medicare Other | Source: Ambulatory Visit | Attending: Physician Assistant | Admitting: Physician Assistant

## 2024-01-31 DIAGNOSIS — R072 Precordial pain: Secondary | ICD-10-CM | POA: Diagnosis present

## 2024-01-31 LAB — NM PET CT CARDIAC PERFUSION MULTI W/ABSOLUTE BLOODFLOW
MBFR: 0.99
Rest MBF: 1.73 ml/g/min
Rest Nuclear Isotope Dose: 28 mCi
ST Depression (mm): 0 mm
Stress MBF: 1.72 ml/g/min
Stress Nuclear Isotope Dose: 28 mCi

## 2024-01-31 MED ORDER — REGADENOSON 0.4 MG/5ML IV SOLN
INTRAVENOUS | Status: AC
  Filled 2024-01-31: qty 5

## 2024-01-31 MED ORDER — RUBIDIUM RB82 GENERATOR (RUBYFILL)
28.0100 | PACK | Freq: Once | INTRAVENOUS | Status: AC
  Administered 2024-01-31: 28.01 via INTRAVENOUS

## 2024-01-31 MED ORDER — RUBIDIUM RB82 GENERATOR (RUBYFILL)
28.0000 | PACK | Freq: Once | INTRAVENOUS | Status: AC
  Administered 2024-01-31: 28 via INTRAVENOUS

## 2024-01-31 MED ORDER — REGADENOSON 0.4 MG/5ML IV SOLN
0.4000 mg | Freq: Once | INTRAVENOUS | Status: AC
  Administered 2024-01-31: 0.4 mg via INTRAVENOUS

## 2024-02-05 ENCOUNTER — Ambulatory Visit: Payer: 59 | Admitting: Physician Assistant

## 2024-02-05 ENCOUNTER — Other Ambulatory Visit: Payer: Self-pay | Admitting: Physician Assistant

## 2024-02-05 ENCOUNTER — Ambulatory Visit: Payer: 59 | Attending: Physician Assistant | Admitting: Physician Assistant

## 2024-02-05 ENCOUNTER — Encounter: Payer: Self-pay | Admitting: Physician Assistant

## 2024-02-05 VITALS — BP 100/56 | HR 86 | Ht 70.0 in | Wt 242.4 lb

## 2024-02-05 DIAGNOSIS — I2510 Atherosclerotic heart disease of native coronary artery without angina pectoris: Secondary | ICD-10-CM | POA: Diagnosis not present

## 2024-02-05 DIAGNOSIS — R9439 Abnormal result of other cardiovascular function study: Secondary | ICD-10-CM | POA: Diagnosis not present

## 2024-02-05 DIAGNOSIS — Z01810 Encounter for preprocedural cardiovascular examination: Secondary | ICD-10-CM

## 2024-02-05 DIAGNOSIS — R079 Chest pain, unspecified: Secondary | ICD-10-CM | POA: Diagnosis not present

## 2024-02-05 DIAGNOSIS — I10 Essential (primary) hypertension: Secondary | ICD-10-CM

## 2024-02-05 DIAGNOSIS — I6523 Occlusion and stenosis of bilateral carotid arteries: Secondary | ICD-10-CM

## 2024-02-05 DIAGNOSIS — E785 Hyperlipidemia, unspecified: Secondary | ICD-10-CM

## 2024-02-05 DIAGNOSIS — R600 Localized edema: Secondary | ICD-10-CM

## 2024-02-05 MED ORDER — OLMESARTAN MEDOXOMIL 20 MG PO TABS: 10.0000 mg | ORAL_TABLET | Freq: Every day | ORAL | 0 refills | Status: DC

## 2024-02-05 MED ORDER — OLMESARTAN MEDOXOMIL 20 MG PO TABS: 10.0000 mg | ORAL_TABLET | Freq: Every day | ORAL | 2 refills | Status: AC

## 2024-02-05 NOTE — Patient Instructions (Addendum)
 Medication Instructions:  Your physician has recommended you make the following change in your medication:  Decrease olmesartan  to 10 mg daily Continue all other medications as prescribed  Labwork: BMET & CBC 02/12/2024 at [facility] Non-fasting  Testing/Procedures: Your physician has requested that you have a cardiac catheterization. Cardiac catheterization is used to diagnose and/or treat various heart conditions. Doctors may recommend this procedure for a number of different reasons. The most common reason is to evaluate chest pain. Chest pain can be a symptom of coronary artery disease (CAD), and cardiac catheterization can show whether plaque is narrowing or blocking your heart's arteries. This procedure is also used to evaluate the valves, as well as measure the blood flow and oxygen levels in different parts of your heart. For further information please visit [email or web address] Please follow instruction sheet, as given.  Follow-Up: Your physician recommends that you schedule a follow-up appointment in: 1 month  Any Other Special Instructions Will Be Listed Below (If Applicable).  If you need a refill on your cardiac medications before your next appointment, please call your pharmacy.   [facility] A DEPT OF [place] [address] [place] [address] Dept: [phone] Loc: [phone]  Russell Padilla  02/05/2025  You are scheduled for a Cardiac Catheterization on Friday, May 16 with Dr. Antoinette Batman.  1. Please arrive at the [facility] (Main Entrance A) at [facility]: [address] [place], [place] [address] at 7:00 AM (This time is 2 hour(s) before your procedure to ensure your preparation).   Free valet parking service is available. You will check in at ADMITTING. The support person will be asked to wait in the waiting room.  It is OK to have someone drop you off and come back when  you are ready to be discharged.    Special note: Every effort is made to have your procedure done on time. Please understand that emergencies sometimes delay scheduled procedures.  2. Diet: Do not eat solid foods after midnight.  The patient may have clear liquids until 5am upon the day of the procedure.  3. Labs: You will need to have blood drawn on , Feb 12 2024 at [facility]. You do not need to be fasting.  4. Medication instructions in preparation for your procedure: You may take your medications on the morning of your cath except olmesartan  and furosemide .    Contrast Allergy: No  Do not take olmesartan  or furosemide  the day before your cath or the day of your cath  On the morning of your procedure, take your Aspirin  81 mg and any morning medicines NOT listed above.  You may use sips of water.  5. Plan to go home the same day, you will only stay overnight if medically necessary. 6. Bring a current list of your medications and current insurance cards. 7. You MUST have a responsible person to drive you home. 8. Someone MUST be with you the first 24 hours after you arrive home or your discharge will be delayed. 9. Please wear clothes that are easy to get on and off and wear slip-on shoes.  Thank you for allowing us  to care for you!   -- [place] [facility]

## 2024-02-06 ENCOUNTER — Other Ambulatory Visit: Payer: Self-pay | Admitting: Gastroenterology

## 2024-02-06 ENCOUNTER — Ambulatory Visit
Admission: RE | Admit: 2024-02-06 | Discharge: 2024-02-06 | Disposition: A | Discharge status: Home / Self Care | Source: Ambulatory Visit | Attending: Gastroenterology

## 2024-02-06 DIAGNOSIS — R143 Flatulence: Secondary | ICD-10-CM

## 2024-02-12 ENCOUNTER — Other Ambulatory Visit (HOSPITAL_COMMUNITY)
Admission: RE | Admit: 2024-02-12 | Discharge: 2024-02-12 | Disposition: A | Discharge status: Home / Self Care | Source: Ambulatory Visit | Attending: Physician Assistant | Admitting: Physician Assistant

## 2024-02-12 ENCOUNTER — Ambulatory Visit: Payer: Medicare Other | Admitting: Surgery

## 2024-02-12 DIAGNOSIS — R079 Chest pain, unspecified: Secondary | ICD-10-CM | POA: Insufficient documentation

## 2024-02-12 DIAGNOSIS — I2510 Atherosclerotic heart disease of native coronary artery without angina pectoris: Secondary | ICD-10-CM | POA: Insufficient documentation

## 2024-02-12 DIAGNOSIS — Z01810 Encounter for preprocedural cardiovascular examination: Secondary | ICD-10-CM | POA: Insufficient documentation

## 2024-02-12 DIAGNOSIS — R9439 Abnormal result of other cardiovascular function study: Secondary | ICD-10-CM | POA: Diagnosis present

## 2024-02-12 LAB — BASIC METABOLIC PANEL WITH GFR
Anion gap: 8 (ref 5–15)
BUN: 10 mg/dL (ref 8–23)
CO2: 25 mmol/L (ref 22–32)
Calcium: 8.5 mg/dL — ABNORMAL LOW (ref 8.9–10.3)
Chloride: 102 mmol/L (ref 98–111)
Creatinine, Ser: 1.32 mg/dL — ABNORMAL HIGH (ref 0.61–1.24)
GFR, Estimated: 58 mL/min — ABNORMAL LOW (ref >60)
Glucose, Bld: 111 mg/dL — ABNORMAL HIGH (ref 70–99)
Potassium: 4.4 mmol/L (ref 3.5–5.1)
Sodium: 135 mmol/L (ref 135–145)

## 2024-02-12 LAB — CBC
HCT: 35.1 % — ABNORMAL LOW (ref 39.0–52.0)
Hemoglobin: 11.1 g/dL — ABNORMAL LOW (ref 13.0–17.0)
MCH: 27.6 pg (ref 26.0–34.0)
MCHC: 31.6 g/dL (ref 30.0–36.0)
MCV: 87.3 fL (ref 80.0–100.0)
Platelets: 199 10*3/uL (ref 150–400)
RBC: 4.02 MIL/uL — ABNORMAL LOW (ref 4.22–5.81)
RDW: 17.7 % — ABNORMAL HIGH (ref 11.5–15.5)
WBC: 5.2 10*3/uL (ref 4.0–10.5)
nRBC: 0 % (ref 0.0–0.2)

## 2024-02-13 ENCOUNTER — Ambulatory Visit: Payer: Self-pay | Admitting: Physician Assistant

## 2024-02-15 ENCOUNTER — Telehealth: Payer: Self-pay | Admitting: *Deleted

## 2024-02-15 LAB — LAB REPORT - SCANNED: EGFR (Non-African Amer.): 57

## 2024-02-15 NOTE — Telephone Encounter (Signed)
 Cardiac Catheterization scheduled at [facility] for: Friday Feb 16, 2024  9 AM Arrival time [facility] Main Entrance A at: 7 AM  Nothing to eat after midnight prior to procedure, clear liquids until 5 AM day of procedure  Medication instructions: -Hold:  Lasix /Olmesartan -day before and day of procedure-per protocol GFR < 60 (58)  -Other usual morning medications can be taken with sips of water including aspirin  81 mg.  Plan to go home the same day, you will only stay overnight if medically necessary.  You must have responsible adult to drive you home.  Someone must be with you the first 24 hours after you arrive home.  Reviewed procedure instructions with patient.

## 2024-02-16 ENCOUNTER — Other Ambulatory Visit: Payer: Self-pay

## 2024-02-16 ENCOUNTER — Encounter (HOSPITAL_COMMUNITY)
Admission: RE | Disposition: A | Discharge status: Home / Self Care | Payer: Self-pay | Source: Home / Self Care | Attending: Cardiovascular Disease

## 2024-02-16 ENCOUNTER — Ambulatory Visit (HOSPITAL_COMMUNITY)
Admission: RE | Admit: 2024-02-16 | Discharge: 2024-02-16 | Disposition: A | Discharge status: Home / Self Care | Attending: Cardiovascular Disease | Admitting: Cardiovascular Disease

## 2024-02-16 DIAGNOSIS — I2582 Chronic total occlusion of coronary artery: Secondary | ICD-10-CM | POA: Diagnosis not present

## 2024-02-16 DIAGNOSIS — R9439 Abnormal result of other cardiovascular function study: Secondary | ICD-10-CM

## 2024-02-16 DIAGNOSIS — Z955 Presence of coronary angioplasty implant and graft: Secondary | ICD-10-CM | POA: Diagnosis not present

## 2024-02-16 DIAGNOSIS — I25118 Atherosclerotic heart disease of native coronary artery with other forms of angina pectoris: Secondary | ICD-10-CM | POA: Diagnosis not present

## 2024-02-16 DIAGNOSIS — I10 Essential (primary) hypertension: Secondary | ICD-10-CM | POA: Insufficient documentation

## 2024-02-16 DIAGNOSIS — D509 Iron deficiency anemia, unspecified: Secondary | ICD-10-CM | POA: Insufficient documentation

## 2024-02-16 DIAGNOSIS — Z79899 Other long term (current) drug therapy: Secondary | ICD-10-CM | POA: Insufficient documentation

## 2024-02-16 DIAGNOSIS — I6529 Occlusion and stenosis of unspecified carotid artery: Secondary | ICD-10-CM | POA: Insufficient documentation

## 2024-02-16 DIAGNOSIS — E785 Hyperlipidemia, unspecified: Secondary | ICD-10-CM | POA: Insufficient documentation

## 2024-02-16 DIAGNOSIS — K5090 Crohn's disease, unspecified, without complications: Secondary | ICD-10-CM | POA: Diagnosis not present

## 2024-02-16 DIAGNOSIS — I2510 Atherosclerotic heart disease of native coronary artery without angina pectoris: Secondary | ICD-10-CM

## 2024-02-16 HISTORY — PX: LEFT HEART CATH AND CORONARY ANGIOGRAPHY: CATH118249

## 2024-02-16 SURGERY — LEFT HEART CATH AND CORONARY ANGIOGRAPHY
Anesthesia: LOCAL

## 2024-02-16 MED ORDER — SODIUM CHLORIDE 0.9 % IV SOLN: INTRAVENOUS | Status: DC

## 2024-02-16 MED ORDER — HEPARIN (PORCINE) IN NACL 2000-0.9 UNIT/L-% IV SOLN
INTRAVENOUS | Status: DC | PRN
  Administered 2024-02-16: 1000 mL

## 2024-02-16 MED ORDER — SODIUM CHLORIDE 0.9% FLUSH: 3.0000 mL | Freq: Two times a day (BID) | INTRAVENOUS | Status: DC

## 2024-02-16 MED ORDER — VERAPAMIL HCL 2.5 MG/ML IV SOLN
INTRAVENOUS | Status: AC
  Filled 2024-02-16: qty 2

## 2024-02-16 MED ORDER — SODIUM CHLORIDE 0.9% FLUSH: 3.0000 mL | INTRAVENOUS | Status: DC | PRN

## 2024-02-16 MED ORDER — VERAPAMIL HCL 2.5 MG/ML IV SOLN
INTRAVENOUS | Status: DC | PRN
  Administered 2024-02-16: 10 mL via INTRA_ARTERIAL

## 2024-02-16 MED ORDER — HEPARIN SODIUM (PORCINE) 1000 UNIT/ML IJ SOLN
INTRAMUSCULAR | Status: DC | PRN
  Administered 2024-02-16: 5000 [IU] via INTRAVENOUS

## 2024-02-16 MED ORDER — ONDANSETRON HCL 4 MG/2ML IJ SOLN: 4.0000 mg | Freq: Four times a day (QID) | INTRAMUSCULAR | Status: DC | PRN

## 2024-02-16 MED ORDER — LIDOCAINE HCL (PF) 1 % IJ SOLN
INTRAMUSCULAR | Status: DC | PRN
  Administered 2024-02-16: 2 mL

## 2024-02-16 MED ORDER — ASPIRIN 81 MG PO CHEW: 81.0000 mg | CHEWABLE_TABLET | ORAL | Status: DC

## 2024-02-16 MED ORDER — FENTANYL CITRATE (PF) 100 MCG/2ML IJ SOLN
INTRAMUSCULAR | Status: DC | PRN
  Administered 2024-02-16: 50 ug via INTRAVENOUS

## 2024-02-16 MED ORDER — FENTANYL CITRATE (PF) 100 MCG/2ML IJ SOLN
INTRAMUSCULAR | Status: AC
  Filled 2024-02-16: qty 2

## 2024-02-16 MED ORDER — MIDAZOLAM HCL 2 MG/2ML IJ SOLN
INTRAMUSCULAR | Status: DC | PRN
  Administered 2024-02-16: 2 mg via INTRAVENOUS

## 2024-02-16 MED ORDER — SODIUM CHLORIDE 0.9 % IV SOLN: 250.0000 mL | INTRAVENOUS | Status: DC | PRN

## 2024-02-16 MED ORDER — LIDOCAINE HCL (PF) 1 % IJ SOLN
INTRAMUSCULAR | Status: AC
  Filled 2024-02-16: qty 30

## 2024-02-16 MED ORDER — HEPARIN SODIUM (PORCINE) 1000 UNIT/ML IJ SOLN
INTRAMUSCULAR | Status: AC
  Filled 2024-02-16: qty 10

## 2024-02-16 MED ORDER — MIDAZOLAM HCL 2 MG/2ML IJ SOLN
INTRAMUSCULAR | Status: AC
Start: 2024-02-16 — End: ?
  Filled 2024-02-16: qty 2

## 2024-02-16 MED ORDER — IOHEXOL 350 MG/ML SOLN
INTRAVENOUS | Status: DC | PRN
Start: 2024-02-16 — End: 2024-02-16
  Administered 2024-02-16: 30 mL

## 2024-02-16 MED ORDER — HYDRALAZINE HCL 20 MG/ML IJ SOLN: 10.0000 mg | INTRAMUSCULAR | Status: DC | PRN

## 2024-02-16 MED ORDER — ACETAMINOPHEN 325 MG PO TABS: 650.0000 mg | ORAL_TABLET | ORAL | Status: DC | PRN

## 2024-02-16 MED ORDER — LABETALOL HCL 5 MG/ML IV SOLN: 10.0000 mg | INTRAVENOUS | Status: DC | PRN

## 2024-02-16 SURGICAL SUPPLY — 7 items
CATH 5FR JL3.5 JR4 ANG PIG MP (CATHETERS) IMPLANT
DEVICE RAD COMP TR BAND LRG (VASCULAR PRODUCTS) IMPLANT
GLIDESHEATH SLEND SS 6F .021 (SHEATH) IMPLANT
GUIDEWIRE INQWIRE 1.5J.035X260 (WIRE) IMPLANT
PACK CARDIAC CATHETERIZATION (CUSTOM PROCEDURE TRAY) ×1 IMPLANT
SET ATX-X65L (MISCELLANEOUS) IMPLANT
SHEATH PROBE COVER 6X72 (BAG) IMPLANT

## 2024-02-16 NOTE — Interval H&P Note (Signed)
 History and Physical Interval Note:  02/16/2024 8:07 AM  Russell Padilla  has presented today for surgery, with the diagnosis of chest pain, abnormal pet scan.  The various methods of treatment have been discussed with the patient and family. After consideration of risks, benefits and other options for treatment, the patient has consented to  Procedure(s): LEFT HEART CATH AND CORONARY ANGIOGRAPHY (N/A) as a surgical intervention.  The patient's history has been reviewed, patient examined, no change in status, stable for surgery.  I have reviewed the patient's chart and labs.  Questions were answered to the patient's satisfaction.    Cath Lab Visit (complete for each Cath Lab visit)  Clinical Evaluation Leading to the Procedure:   ACS: No.  Non-ACS:    Anginal Classification: CCS II  Anti-ischemic medical therapy: No Therapy  Non-Invasive Test Results: High-risk stress test findings: cardiac mortality >3%/year  Prior CABG: No previous CABG    Antoinette Batman

## 2024-02-16 NOTE — Discharge Instructions (Signed)
 Radial Site Care  This sheet gives you information about how to care for yourself after your procedure. Your health care provider may also give you more specific instructions. If you have problems or questions, contact your health care provider. What can I expect after the procedure? After the procedure, it is common to have: Bruising and tenderness at the catheter insertion area. Follow these instructions at home: Medicines Take over-the-counter and prescription medicines only as told by your health care provider. Insertion site care Follow instructions from your health care provider about how to take care of your insertion site. Make sure you: Wash your hands with soap and water before you remove your bandage (dressing). If soap and water are not available, use hand sanitizer. May remove dressing in 24 hours. Check your insertion site every day for signs of infection. Check for: Redness, swelling, or pain. Fluid or blood. Pus or a bad smell. Warmth. Do no take baths, swim, or use a hot tub for 5 days. You may shower 24-48 hours after the procedure. Remove the dressing and gently wash the site with plain soap and water. Pat the area dry with a clean towel. Do not rub the site. That could cause bleeding. Do not apply powder or lotion to the site. Activity  For 24 hours after the procedure, or as directed by your health care provider: Do not flex or bend the affected arm. Do not push or pull heavy objects with the affected arm. Do not drive yourself home from the hospital or clinic. You may drive 24 hours after the procedure. Do not operate machinery or power tools. KEEP ARM ELEVATED THE REMAINDER OF THE DAY. Do not push, pull or lift anything that is heavier than 10 lb for 5 days. Ask your health care provider when it is okay to: Return to work or school. Resume usual physical activities or sports. Resume sexual activity. General instructions If the catheter site starts to  bleed, raise your arm and put firm pressure on the site. If the bleeding does not stop, get help right away. This is a medical emergency. DRINK PLENTY OF FLUIDS FOR THE NEXT 2-3 DAYS. No alcohol consumption for 24 hours after receiving sedation. If you went home on the same day as your procedure, a responsible adult should be with you for the first 24 hours after you arrive home. Keep all follow-up visits as told by your health care provider. This is important. Contact a health care provider if: You have a fever. You have redness, swelling, or yellow drainage around your insertion site. Get help right away if: You have unusual pain at the radial site. The catheter insertion area swells very fast. The insertion area is bleeding, and the bleeding does not stop when you hold steady pressure on the area. Your arm or hand becomes pale, cool, tingly, or numb. These symptoms may represent a serious problem that is an emergency. Do not wait to see if the symptoms will go away. Get medical help right away. Call your local emergency services (911 in the [place] Do not drive yourself to the hospital. Summary After the procedure, it is common to have bruising and tenderness at the site. Follow instructions from your health care provider about how to take care of your radial site wound. Check the wound every day for signs of infection.  This information is not intended to replace advice given to you by your health care provider. Make sure you discuss any questions you have with  your health care provider. Document Revised: 10/25/2017 Document Reviewed: 10/25/2017 Elsevier Patient Education  2020 [employer].

## 2024-02-18 ENCOUNTER — Encounter (HOSPITAL_COMMUNITY): Payer: Self-pay | Admitting: Cardiovascular Disease

## 2024-03-05 ENCOUNTER — Ambulatory Visit (HOSPITAL_COMMUNITY)
Admission: RE | Admit: 2024-03-05 | Discharge: 2024-03-05 | Disposition: A | Discharge status: Home / Self Care | Source: Ambulatory Visit | Attending: Family Medicine | Admitting: Family Medicine

## 2024-03-05 ENCOUNTER — Ambulatory Visit
Admission: EM | Admit: 2024-03-05 | Discharge: 2024-03-05 | Disposition: A | Discharge status: Home / Self Care | Attending: Nurse Practitioner | Admitting: Nurse Practitioner

## 2024-03-05 ENCOUNTER — Encounter: Payer: Self-pay | Admitting: Emergency Medicine

## 2024-03-05 DIAGNOSIS — M79605 Pain in left leg: Secondary | ICD-10-CM | POA: Diagnosis present

## 2024-03-05 DIAGNOSIS — M7989 Other specified soft tissue disorders: Secondary | ICD-10-CM

## 2024-03-05 NOTE — Discharge Instructions (Addendum)
 Please go to [facility] for an ultrasound of your left lower leg.  You will need to go to the main entrance of the hospital to get to the radiology department.  You will be contacted when the results of the ultrasound are received. You may take over-the-counter Tylenol  for pain or discomfort. Continue to elevate the left lower leg above the level of the heart is much as possible to help with pain or swelling. Recommend applying cool compresses to the left lower leg to help with pain. Continue wearing your compression socks. I would like for you to follow-up with vascular for reevaluation within the next 7 to 10 days if your ultrasound is negative.  Recommend follow-up with your primary care physician within the next 7 to 10 days for reevaluation.

## 2024-03-05 NOTE — ED Provider Notes (Signed)
 [facility]    CSN: [id number] Arrival date & time: 03/05/24  1301      History   Chief Complaint No chief complaint on file.   HPI Russell Padilla is a 68 y.o. male.   The history is provided by the patient.   Patient presents for complaints of pain and swelling to the left leg.  Patient states over the past day, he has had increased pain to the upper left leg.  He reports that he is also had swelling to the lower left leg.  Patient reports prior history of same.  States that the pain in the top of his left leg feels like a "burning sensation."  He states after the sensation eases, the area is tender to touch.  He denies chest pain, shortness of breath, difficulty breathing, or abdominal pain.  He states that he has been wearing compression socks for the swelling in his left lower leg.  Patient with underlying history of CAD.  States he is scheduled to follow-up with vascular in September.  Patient states he is concerned about a blood clot.  Past Medical History:  Diagnosis Date   Abdominal pain 07-11-13   ongoing and unspecified   Arthritis    Bladder stone 07-11-13   surgery planned   CAD (coronary artery disease)    a. 12/2012 Cath/PCI: DES to superior branch of OM2;  b. Repeat cath 4/14 with 20% LAD, patent stent OM3, 70% diffuse stenosis small, non-dominant RCA.   Essential hypertension    Headache    HLD (hyperlipidemia)    Renal disorder    renal calculi   Stomach pain    chronic    Patient Active Problem List   Diagnosis Date Noted   Iron deficiency anemia 11/21/2023   Right lumbar radiculopathy 03/01/2016   Chronic headache 03/01/2016   Frequent headaches 08/11/2015   Chronic neck pain 08/11/2015   Chronic low back pain 08/11/2015   Coronary artery disease involving native coronary artery of native heart without angina pectoris 04/13/2015   Abnormal stress test    Pain of upper abdomen 02/16/2015   Change in blood platelet count 02/16/2015    Essential hypertension    HLD (hyperlipidemia)    Benign prostatic hyperplasia without urinary obstruction 12/22/2014   Decreased libido 12/22/2014   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   Dyspnea 12/31/2013   Left elbow pain 11/29/2013   Chest pain, non-cardiac 10/16/2013   Chest pain 01/16/2013   Bradycardia 01/16/2013   Chest pain at rest 12/05/2012   Achalasia 07/21/2011   Hemorrhoids 11/02/2010   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   Constipation 11/02/2010   DYSPHAGIA 11/02/2010   FLATULENCE-GAS-BLOATING 11/02/2010   GERD 08/10/2009   ABDOMINAL PAIN RIGHT UPPER QUADRANT 08/10/2009   Nonspecific (abnormal) findings on radiological and other examination of biliary tract 08/10/2009   History of colonic polyps 08/10/2009   CHOLEDOCHOLITHIASIS 05/15/2009   THROMBOCYTOPENIA, CHRONIC 05/13/2009   NEPHROLITHIASIS 05/13/2009   NAUSEA 05/13/2009   ABDOMINAL PAIN-EPIGASTRIC 05/13/2009    Past Surgical History:  Procedure Laterality Date   BACK SURGERY     2 cervical/ 2 lumbar fusions   CARDIAC CATHETERIZATION N/A 03/04/2015   Procedure: Left Heart Cath and Coronary Angiography;  Surgeon: Odie Benne, MD;  Location: [facility] INVASIVE CV LAB;  Service: Cardiovascular;  Laterality: N/A;   CHOLECYSTECTOMY     laparoscopic.   CORONARY STENT PLACEMENT     3'14   ESOPHAGOGASTRODUODENOSCOPY ENDOSCOPY     multiple times-[facility]/ and now being followed  by Endo- [facility]   KNEE ARTHROSCOPY Left    '80's   LEFT HEART CATH AND CORONARY ANGIOGRAPHY N/A 02/16/2024   Procedure: LEFT HEART CATH AND CORONARY ANGIOGRAPHY;  Surgeon: Odie Benne, MD;  Location: [facility] INVASIVE CV LAB;  Service: Cardiovascular;  Laterality: N/A;   LEFT HEART CATHETERIZATION WITH CORONARY ANGIOGRAM N/A 12/05/2012   Procedure: LEFT HEART CATHETERIZATION WITH CORONARY ANGIOGRAM;  Surgeon: Darrold Emms, MD;  Location: [facility] CATH LAB;  Service: Cardiovascular;  Laterality: N/A;   LEFT HEART CATHETERIZATION WITH CORONARY  ANGIOGRAM N/A 01/17/2013   Procedure: LEFT HEART CATHETERIZATION WITH CORONARY ANGIOGRAM;  Surgeon: Wenona Hamilton, MD;  Location: [facility] CATH LAB;  Service: Cardiovascular;  Laterality: N/A;   PERCUTANEOUS CORONARY STENT INTERVENTION (PCI-S) N/A 12/06/2012   Procedure: PERCUTANEOUS CORONARY STENT INTERVENTION (PCI-S);  Surgeon: Odie Benne, MD;  Location: [facility] CATH LAB;  Service: Cardiovascular;  Laterality: N/A;       Home Medications    Prior to Admission medications   Medication Sig Start Date End Date Taking? Authorizing Provider  acetaminophen  (TYLENOL ) 500 MG tablet Take 1,000 mg by mouth every 8 (eight) hours as needed for moderate pain (pain score 4-6).    [provider]  adalimumab (HUMIRA) 40 MG/0.8ML AJKT pen Inject 40 mg into the skin every 14 (fourteen) days. 06/15/23   [provider]  Biotin (SUPER BIOTIN) 5 MG TABS Take 1 tablet by mouth daily.    [provider]  cholecalciferol (VITAMIN D3) 25 MCG (1000 UNIT) tablet Take 1,000 Units by mouth daily.    [provider]  dicyclomine  (BENTYL ) 20 MG tablet Take 20 mg by mouth 2 (two) times daily.    [provider]  Evolocumab  (REPATHA  SURECLICK) 140 MG/ML SOAJ Inject 140 mg into the skin every 14 (fourteen) days. 11/13/23   Odie Benne, MD  fluticasone  (FLONASE ) 50 MCG/ACT nasal spray Place 1 spray into both nostrils at bedtime as needed for allergies. 01/20/15   [provider]  furosemide  (LASIX ) 20 MG tablet Take 1 tablet (20 mg total) by mouth as needed. 12/15/23   Dunn, [name] N, PA-C  gabapentin  (NEURONTIN ) 300 MG capsule Take 1 tablet PO in the morning and take 2 tablets PO at bedtime. 08/03/15   Phebe Brasil, MD  ketoconazole (NIZORAL) 2 % cream Apply 1 Application topically daily as needed for irritation.    [provider]  loperamide (IMODIUM A-D) 2 MG tablet Take 2 mg by mouth 4 (four) times daily as needed for diarrhea or loose stools.     [provider]  mesalamine (LIALDA) 1.2 g EC tablet Take 2.4 g by mouth daily with breakfast.    [provider]  Multiple Vitamins-Minerals (MULTIVITAMIN WITH MINERALS) tablet Take 1 tablet by mouth daily. Centrum Silver    [provider]  nitroGLYCERIN  (NITROSTAT ) 0.4 MG SL tablet Place 1 tablet (0.4 mg total) under the tongue every 5 (five) minutes as needed for chest pain (up to 3 doses). 11/06/23   Dunn, [name] N, PA-C  olmesartan  (BENICAR ) 20 MG tablet Take 0.5 tablets (10 mg total) by mouth daily. 02/05/24   Flo Hummingbird, PA-C  ondansetron  (ZOFRAN ) 4 MG tablet Take 4 mg by mouth 2 (two) times daily as needed for nausea or vomiting. 12/21/23   [provider]  oxyCODONE -acetaminophen  (PERCOCET/ROXICET) 5-325 MG per tablet Take 1 tablet by mouth every 4 (four) hours as needed for severe pain (pain score 7-10).    [provider]  pantoprazole  (PROTONIX ) 40 MG tablet Take 40 mg by mouth 2 (two) times daily.     [provider]  simvastatin  (ZOCOR ) 40 MG tablet Take 40 mg by mouth at bedtime. 09/22/21   [provider]  sucralfate  (CARAFATE ) 1 GM/10ML suspension Take 1 g by mouth 3 (three) times daily as needed (spasms). With Lidocaine  2%  and Mylanta    [provider]  tiZANidine (ZANAFLEX) 2 MG tablet Take 2 mg by mouth every 6 (six) hours as needed for muscle spasms. 01/31/24   [provider]  traMADol (ULTRAM) 50 MG tablet Take 50 mg by mouth every 6 (six) hours as needed for moderate pain.    [provider]  triamcinolone  (KENALOG ) 0.025 % ointment Apply 1 Application topically 2 (two) times daily as needed (irritation).    [provider]    Family History Family History  Problem Relation Age of Onset   Stomach cancer Mother    Congestive Heart Failure Father    Dementia Father    Heart disease Brother     Social History Social History   Tobacco Use   Smoking status: Former     Current packs/day: 0.00    Average packs/day: 0.5 packs/day for 30.0 years (15.0 ttl pk-yrs)    Types: Cigarettes    Start date: 12/05/1982    Quit date: 12/04/2012    Years since quitting: 11.2   Smokeless tobacco: Never  Vaping Use   Vaping status: Never Used  Substance Use Topics   Alcohol use: No    Alcohol/week: 0.0 standard drinks of alcohol   Drug use: No     Allergies   Crestor [rosuvastatin calcium ] and Latex   Review of Systems Review of Systems Per HPI  Physical Exam Triage Vital Signs ED Triage Vitals  Encounter Vitals Group     BP 03/05/24 1315 100/64     Systolic BP Percentile --      Diastolic BP Percentile --      Pulse Rate 03/05/24 1315 71     Resp 03/05/24 1315 18     Temp 03/05/24 1315 98.9 F (37.2 C)     Temp Source 03/05/24 1315 Oral     SpO2 03/05/24 1315 92 %     Weight --      Height --      Head Circumference --      Peak Flow --      Pain Score 03/05/24 1319 5     Pain Loc --      Pain Education --      Exclude from Growth Chart --    No data found.  Updated Vital Signs BP 100/64 (BP Location: Right Arm)   Pulse 71   Temp 98.9 F (37.2 C) (Oral)   Resp 18   SpO2 92%   Visual Acuity Right Eye Distance:   Left Eye Distance:   Bilateral Distance:    Right Eye Near:   Left Eye Near:    Bilateral Near:     Physical Exam Vitals and nursing note reviewed.  Constitutional:      General: He is not in acute distress.    Appearance: Normal appearance.  HENT:     Head: Normocephalic.  Eyes:     Extraocular Movements: Extraocular movements intact.     Pupils: Pupils are equal, round, and reactive to light.  Cardiovascular:     Rate and Rhythm: Normal rate and regular rhythm.  Pulses: Normal pulses.     Heart sounds: Normal heart sounds.     Comments: Negative Celine Collard' sign to the left calf. Pulmonary:     Effort: Pulmonary effort is normal. No respiratory distress.     Breath sounds: Normal breath sounds. No stridor. No  wheezing, rhonchi or rales.  Abdominal:     General: Bowel sounds are normal.     Palpations: Abdomen is soft.     Tenderness: There is no abdominal tenderness.  Musculoskeletal:     Cervical back: Normal range of motion.     Left lower leg: Edema present.  Lymphadenopathy:     Cervical: No cervical adenopathy.  Skin:    General: Skin is warm and dry.  Neurological:     General: No focal deficit present.     Mental Status: He is alert and oriented to person, place, and time.  Psychiatric:        Mood and Affect: Mood normal.        Behavior: Behavior normal.      UC Treatments / Results  Labs (all labs ordered are listed, but only abnormal results are displayed) Labs Reviewed - No data to display  EKG   Radiology US  Venous Img Lower Unilateral Left (DVT) Result Date: 03/05/2024 CLINICAL DATA:  68 year old male with left lower extremity pain and swelling. EXAM: LEFT LOWER EXTREMITY VENOUS DOPPLER ULTRASOUND TECHNIQUE: Gray-scale sonography with graded compression, as well as color Doppler and duplex ultrasound were performed to evaluate the left lower extremity deep venous systems from the level of the common femoral vein and including the common femoral, femoral, profunda femoral, popliteal and calf veins including the posterior tibial, peroneal and gastrocnemius veins when visible. Spectral Doppler was utilized to evaluate flow at rest and with distal augmentation maneuvers in the common femoral, femoral and popliteal veins. The contralateral common femoral vein was also evaluated for comparison. COMPARISON:  07/19/2023 FINDINGS: LEFT LOWER EXTREMITY Common Femoral Vein: No evidence of thrombus. Normal compressibility, respiratory phasicity and response to augmentation. Central Greater Saphenous Vein: No evidence of thrombus. Normal compressibility and flow on color Doppler imaging. Central Profunda Femoral Vein: No evidence of thrombus. Normal compressibility and flow on color  Doppler imaging. Femoral Vein: No evidence of thrombus. Normal compressibility, respiratory phasicity and response to augmentation. Popliteal Vein: No evidence of thrombus. Normal compressibility, respiratory phasicity and response to augmentation. Calf Veins: No evidence of thrombus. Normal compressibility and flow on color Doppler imaging. Other Findings:  None. RIGHT LOWER EXTREMITY Common Femoral Vein: No evidence of thrombus. Normal compressibility, respiratory phasicity and response to augmentation. IMPRESSION: No evidence of left lower extremity deep venous thrombosis. Creasie Doctor, MD [facility] Electronically Signed   By: Creasie Doctor M.D.   On: 03/05/2024 15:49    Procedures Procedures (including critical care time)  Medications Ordered in UC Medications - No data to display  Initial Impression / Assessment and Plan / UC Course  I have reviewed the triage vital signs and the nursing notes.  Pertinent labs & imaging results that were available during my care of the patient were reviewed by me and considered in my medical decision making (see chart for details).  Patient concern for DVT of the left lower extremity.  Ultrasound of the left lower leg has been ordered.  Supportive care recommendations were provided and discussed with the patient to include continuing use of his compression socks, elevating the left lower extremity, over-the-counter analgesics, and follow-up with  vascular for reevaluation.  Patient was advised she will be contacted when the results are received.  Patient was also given strict ER follow-up precautions.  Patient was in agreement with this plan of care and verbalizes understanding.  All questions were answered.  Patient stable for discharge.   Update 1617: Call to the patient to advise of ultrasound result.  Spoke with patient verifying 2 patient identifiers.  Patient was advised ultrasound of the left  lower extremity was negative for DVT.  Patient advised to follow-up with vascular or PCP for reevaluation for continued symptoms.  Patient was in agreement with this plan of care and verbalized understanding.  All questions were answered.   Final Clinical Impressions(s) / UC Diagnoses   Final diagnoses:  Left leg swelling     Discharge Instructions      Please go to [facility] for an ultrasound of your left lower leg.  You will need to go to the main entrance of the hospital to get to the radiology department.  You will be contacted when the results of the ultrasound are received. You may take over-the-counter Tylenol  for pain or discomfort. Continue to elevate the left lower leg above the level of the heart is much as possible to help with pain or swelling. Recommend applying cool compresses to the left lower leg to help with pain. Continue wearing your compression socks. I would like for you to follow-up with vascular for reevaluation within the next 7 to 10 days if your ultrasound is negative.  Recommend follow-up with your primary care physician within the next 7 to 10 days for reevaluation.  ED Prescriptions   None    PDMP not reviewed this encounter.   Hardy Lia, NP 03/05/24 2007

## 2024-03-05 NOTE — ED Triage Notes (Signed)
 Left lower leg swollen x 2 days.  States having upper left leg pain.  States upper part of foot hurts.

## 2024-03-06 ENCOUNTER — Ambulatory Visit: Payer: Self-pay

## 2024-03-06 ENCOUNTER — Other Ambulatory Visit: Payer: Self-pay | Admitting: Physician Assistant

## 2024-03-12 ENCOUNTER — Ambulatory Visit: Admitting: Student

## 2024-03-26 ENCOUNTER — Encounter (HOSPITAL_COMMUNITY): Payer: Self-pay

## 2024-03-26 ENCOUNTER — Inpatient Hospital Stay (HOSPITAL_COMMUNITY)
Admission: EM | Admit: 2024-03-26 | Discharge: 2024-04-03 | DRG: 385 | Disposition: A | Discharge status: Home / Self Care | Attending: Internal Medicine | Admitting: Internal Medicine

## 2024-03-26 ENCOUNTER — Emergency Department (HOSPITAL_COMMUNITY)

## 2024-03-26 ENCOUNTER — Other Ambulatory Visit: Payer: Self-pay

## 2024-03-26 DIAGNOSIS — I10 Essential (primary) hypertension: Secondary | ICD-10-CM | POA: Diagnosis not present

## 2024-03-26 DIAGNOSIS — K529 Noninfective gastroenteritis and colitis, unspecified: Secondary | ICD-10-CM | POA: Diagnosis present

## 2024-03-26 DIAGNOSIS — Z6834 Body mass index (BMI) 34.0-34.9, adult: Secondary | ICD-10-CM

## 2024-03-26 DIAGNOSIS — I8392 Asymptomatic varicose veins of left lower extremity: Secondary | ICD-10-CM | POA: Diagnosis present

## 2024-03-26 DIAGNOSIS — Z91040 Latex allergy status: Secondary | ICD-10-CM | POA: Diagnosis not present

## 2024-03-26 DIAGNOSIS — E860 Dehydration: Principal | ICD-10-CM | POA: Diagnosis present

## 2024-03-26 DIAGNOSIS — N210 Calculus in bladder: Secondary | ICD-10-CM | POA: Diagnosis present

## 2024-03-26 DIAGNOSIS — R571 Hypovolemic shock: Secondary | ICD-10-CM | POA: Diagnosis present

## 2024-03-26 DIAGNOSIS — N179 Acute kidney failure, unspecified: Secondary | ICD-10-CM | POA: Diagnosis present

## 2024-03-26 DIAGNOSIS — Z981 Arthrodesis status: Secondary | ICD-10-CM

## 2024-03-26 DIAGNOSIS — E785 Hyperlipidemia, unspecified: Secondary | ICD-10-CM | POA: Diagnosis present

## 2024-03-26 DIAGNOSIS — Z79899 Other long term (current) drug therapy: Secondary | ICD-10-CM

## 2024-03-26 DIAGNOSIS — D84821 Immunodeficiency due to drugs: Secondary | ICD-10-CM | POA: Diagnosis present

## 2024-03-26 DIAGNOSIS — Z888 Allergy status to other drugs, medicaments and biological substances status: Secondary | ICD-10-CM | POA: Diagnosis not present

## 2024-03-26 DIAGNOSIS — I2510 Atherosclerotic heart disease of native coronary artery without angina pectoris: Secondary | ICD-10-CM | POA: Diagnosis present

## 2024-03-26 DIAGNOSIS — Z955 Presence of coronary angioplasty implant and graft: Secondary | ICD-10-CM | POA: Diagnosis not present

## 2024-03-26 DIAGNOSIS — Z79620 Long term (current) use of immunosuppressive biologic: Secondary | ICD-10-CM | POA: Diagnosis not present

## 2024-03-26 DIAGNOSIS — Z8249 Family history of ischemic heart disease and other diseases of the circulatory system: Secondary | ICD-10-CM

## 2024-03-26 DIAGNOSIS — N200 Calculus of kidney: Secondary | ICD-10-CM | POA: Diagnosis present

## 2024-03-26 DIAGNOSIS — Z9049 Acquired absence of other specified parts of digestive tract: Secondary | ICD-10-CM

## 2024-03-26 DIAGNOSIS — R197 Diarrhea, unspecified: Secondary | ICD-10-CM | POA: Diagnosis present

## 2024-03-26 DIAGNOSIS — K640 First degree hemorrhoids: Secondary | ICD-10-CM | POA: Diagnosis present

## 2024-03-26 DIAGNOSIS — K50911 Crohn's disease, unspecified, with rectal bleeding: Secondary | ICD-10-CM | POA: Diagnosis present

## 2024-03-26 DIAGNOSIS — Z87891 Personal history of nicotine dependence: Secondary | ICD-10-CM | POA: Diagnosis not present

## 2024-03-26 DIAGNOSIS — E66811 Obesity, class 1: Secondary | ICD-10-CM | POA: Diagnosis present

## 2024-03-26 DIAGNOSIS — Z800 Family history of malignant neoplasm of digestive organs: Secondary | ICD-10-CM

## 2024-03-26 DIAGNOSIS — M1990 Unspecified osteoarthritis, unspecified site: Secondary | ICD-10-CM | POA: Diagnosis present

## 2024-03-26 HISTORY — DX: Crohn's disease, unspecified, without complications: K50.90

## 2024-03-26 LAB — PROTIME-INR
INR: 1.1 (ref 0.8–1.2)
Prothrombin Time: 14.3 s (ref 11.4–15.2)

## 2024-03-26 LAB — COMPREHENSIVE METABOLIC PANEL WITH GFR
ALT: 17 U/L (ref 0–44)
AST: 19 U/L (ref 15–41)
Albumin: 2.1 g/dL — ABNORMAL LOW (ref 3.5–5.0)
Alkaline Phosphatase: 41 U/L (ref 38–126)
Anion gap: 13 (ref 5–15)
BUN: 12 mg/dL (ref 8–23)
CO2: 27 mmol/L (ref 22–32)
Calcium: 8.3 mg/dL — ABNORMAL LOW (ref 8.9–10.3)
Chloride: 94 mmol/L — ABNORMAL LOW (ref 98–111)
Creatinine, Ser: 2.4 mg/dL — ABNORMAL HIGH (ref 0.61–1.24)
GFR, Estimated: 28 mL/min — ABNORMAL LOW (ref >60)
Glucose, Bld: 95 mg/dL (ref 70–99)
Potassium: 3.5 mmol/L (ref 3.5–5.1)
Sodium: 134 mmol/L — ABNORMAL LOW (ref 135–145)
Total Bilirubin: 0.7 mg/dL (ref 0.0–1.2)
Total Protein: 5 g/dL — ABNORMAL LOW (ref 6.5–8.1)

## 2024-03-26 LAB — CBC WITH DIFFERENTIAL/PLATELET
Abs Immature Granulocytes: 0.02 10*3/uL (ref 0.00–0.07)
Basophils Absolute: 0 10*3/uL (ref 0.0–0.1)
Basophils Relative: 1 %
Eosinophils Absolute: 0.3 10*3/uL (ref 0.0–0.5)
Eosinophils Relative: 4 %
HCT: 36.4 % — ABNORMAL LOW (ref 39.0–52.0)
Hemoglobin: 11.4 g/dL — ABNORMAL LOW (ref 13.0–17.0)
Immature Granulocytes: 0 %
Lymphocytes Relative: 18 %
Lymphs Abs: 1 10*3/uL (ref 0.7–4.0)
MCH: 26.6 pg (ref 26.0–34.0)
MCHC: 31.3 g/dL (ref 30.0–36.0)
MCV: 84.8 fL (ref 80.0–100.0)
Monocytes Absolute: 0.6 10*3/uL (ref 0.1–1.0)
Monocytes Relative: 11 %
Neutro Abs: 3.7 10*3/uL (ref 1.7–7.7)
Neutrophils Relative %: 66 %
Platelets: 252 10*3/uL (ref 150–400)
RBC: 4.29 MIL/uL (ref 4.22–5.81)
RDW: 15.9 % — ABNORMAL HIGH (ref 11.5–15.5)
WBC: 5.7 10*3/uL (ref 4.0–10.5)
nRBC: 0 % (ref 0.0–0.2)

## 2024-03-26 LAB — I-STAT CG4 LACTIC ACID, ED
Lactic Acid, Venous: 1.3 mmol/L (ref 0.5–1.9)
Lactic Acid, Venous: 0.7 mmol/L (ref 0.5–1.9)

## 2024-03-26 LAB — URINALYSIS, W/ REFLEX TO CULTURE (INFECTION SUSPECTED)
Bilirubin Urine: NEGATIVE
Glucose, UA: NEGATIVE mg/dL
Hgb urine dipstick: NEGATIVE
Ketones, ur: NEGATIVE mg/dL
Leukocytes,Ua: NEGATIVE
Nitrite: NEGATIVE
Protein, ur: NEGATIVE mg/dL
Specific Gravity, Urine: 1.005 (ref 1.005–1.030)
pH: 6 (ref 5.0–8.0)

## 2024-03-26 LAB — LIPASE, BLOOD: Lipase: 26 U/L (ref 11–51)

## 2024-03-26 LAB — TROPONIN I (HIGH SENSITIVITY)
Troponin I (High Sensitivity): 8 ng/L (ref <18)
Troponin I (High Sensitivity): 8 ng/L (ref <18)

## 2024-03-26 MED ORDER — LACTATED RINGERS IV SOLN: INTRAVENOUS | Status: DC

## 2024-03-26 MED ORDER — ONDANSETRON HCL 4 MG PO TABS: 4.0000 mg | ORAL_TABLET | Freq: Four times a day (QID) | ORAL | Status: DC | PRN

## 2024-03-26 MED ORDER — METRONIDAZOLE 500 MG/100ML IV SOLN
500.0000 mg | Freq: Two times a day (BID) | INTRAVENOUS | Status: DC
  Administered 2024-03-26 – 2024-03-27 (×2): 500 mg via INTRAVENOUS
  Filled 2024-03-26 (×2): qty 100

## 2024-03-26 MED ORDER — ONDANSETRON HCL 4 MG/2ML IJ SOLN
4.0000 mg | Freq: Four times a day (QID) | INTRAMUSCULAR | Status: DC | PRN
  Administered 2024-03-27 – 2024-03-28 (×2): 4 mg via INTRAVENOUS
  Filled 2024-03-26 (×2): qty 2

## 2024-03-26 MED ORDER — SODIUM CHLORIDE 0.9 % IV SOLN: INTRAVENOUS | Status: DC

## 2024-03-26 MED ORDER — SODIUM CHLORIDE 0.9% FLUSH
3.0000 mL | Freq: Two times a day (BID) | INTRAVENOUS | Status: DC
  Administered 2024-03-27 – 2024-04-03 (×13): 3 mL via INTRAVENOUS

## 2024-03-26 MED ORDER — METRONIDAZOLE 500 MG/100ML IV SOLN
500.0000 mg | Freq: Once | INTRAVENOUS | Status: AC
  Administered 2024-03-26: 500 mg via INTRAVENOUS
  Filled 2024-03-26: qty 100

## 2024-03-26 MED ORDER — ACETAMINOPHEN 325 MG PO TABS
650.0000 mg | ORAL_TABLET | Freq: Four times a day (QID) | ORAL | Status: DC | PRN
  Administered 2024-03-26 – 2024-04-03 (×5): 650 mg via ORAL
  Filled 2024-03-26 (×5): qty 2

## 2024-03-26 MED ORDER — SODIUM CHLORIDE 0.9 % IV SOLN
2.0000 g | INTRAVENOUS | Status: DC
  Administered 2024-03-27: 2 g via INTRAVENOUS
  Filled 2024-03-26: qty 20

## 2024-03-26 MED ORDER — ACETAMINOPHEN 650 MG RE SUPP: 650.0000 mg | Freq: Four times a day (QID) | RECTAL | Status: DC | PRN

## 2024-03-26 MED ORDER — SODIUM CHLORIDE 0.9 % IV BOLUS
1000.0000 mL | Freq: Once | INTRAVENOUS | Status: AC
  Administered 2024-03-26: 1000 mL via INTRAVENOUS

## 2024-03-26 MED ORDER — LACTATED RINGERS IV BOLUS
500.0000 mL | Freq: Once | INTRAVENOUS | Status: AC
  Administered 2024-03-26: 500 mL via INTRAVENOUS

## 2024-03-26 MED ORDER — SODIUM CHLORIDE 0.9 % IV SOLN
2.0000 g | Freq: Once | INTRAVENOUS | Status: AC
  Administered 2024-03-26: 2 g via INTRAVENOUS
  Filled 2024-03-26: qty 20

## 2024-03-26 NOTE — ED Provider Notes (Addendum)
 [place] EMERGENCY DEPARTMENT AT [facility] Provider Note   CSN: [id number] Arrival date & time: 03/26/24  1202     Patient presents with: Dizziness and Diarrhea   Russell Padilla is a 68 y.o. male.   Patient is a 68 year old male who presents with lightheadedness and vision changes.  He has a history of Crohn's disease, hypertension, hyperlipidemia, coronary artery disease.  He says he has diarrhea pretty much daily at baseline.  He has intermittent ongoing pain in his upper abdomen which he normally takes a combination solution of Carafate , Maalox and viscous lidocaine  for.  He said that since Saturday he has been feeling fatigued.  He slept for about 30 hours.  He said he has had lightheadedness on standing.  He said his vision has been white.  He has little bit of a headache.  Since he has been laying on the stretcher he has developed some neck pain.  It is on the left side of his neck.  He does not have any radicular symptoms but he does have some tingling in the first 3 fingers of his left hand.  He denies any other neck pain.  The pain does not radiate down his spine.  He has had prior neck fusion.  No recent injuries.  No cough or cold symptoms.  Has had some nausea but no vomiting.  No known fevers.  No urinary symptoms.  He is on Humira for his Crohn's disease.  He is not on steroids.       Prior to Admission medications   Medication Sig Start Date End Date Taking? Authorizing Provider  acetaminophen  (TYLENOL ) 500 MG tablet Take 1,000 mg by mouth every 8 (eight) hours as needed for moderate pain (pain score 4-6).    [provider]  adalimumab (HUMIRA) 40 MG/0.8ML AJKT pen Inject 40 mg into the skin every 14 (fourteen) days. 06/15/23   [provider]  Biotin (SUPER BIOTIN) 5 MG TABS Take 1 tablet by mouth daily.    [provider]  cholecalciferol (VITAMIN D3) 25 MCG (1000 UNIT) tablet Take 1,000 Units by mouth daily.    [provider]  dicyclomine  (BENTYL ) 20 MG tablet Take 20 mg by mouth 2 (two) times daily.    [provider]  Evolocumab  (REPATHA  SURECLICK) 140 MG/ML SOAJ Inject 140 mg into the skin every 14 (fourteen) days. 11/13/23   Verlin Lonni BIRCH, MD  fluticasone  (FLONASE ) 50 MCG/ACT nasal spray Place 1 spray into both nostrils at bedtime as needed for allergies. 01/20/15   [provider]  furosemide  (LASIX ) 20 MG tablet Take 1 tablet (20 mg total) by mouth as needed. 12/15/23   Dunn, [name] N, PA-C  gabapentin  (NEURONTIN ) 300 MG capsule Take 1 tablet PO in the morning and take 2 tablets PO at bedtime. 08/03/15   Onita Duos, MD  ketoconazole (NIZORAL) 2 % cream Apply 1 Application topically daily as needed for irritation.    [provider]  loperamide (IMODIUM A-D) 2 MG tablet Take 2 mg by mouth 4 (four) times daily as needed for diarrhea or loose stools.    [provider]  mesalamine (LIALDA) 1.2 g EC tablet Take 2.4 g by mouth daily with breakfast.    [provider]  Multiple Vitamins-Minerals (MULTIVITAMIN WITH MINERALS) tablet Take 1 tablet by mouth daily. Centrum Silver    [provider]  nitroGLYCERIN  (NITROSTAT ) 0.4 MG SL tablet Place 1 tablet (0.4 mg total) under the tongue every  5 (five) minutes as needed for chest pain (up to 3 doses). 11/06/23   Dunn, [name] N, PA-C  olmesartan  (BENICAR ) 20 MG tablet Take 0.5 tablets (10 mg total) by mouth daily. 02/05/24   Parthenia Olivia HERO, PA-C  ondansetron  (ZOFRAN ) 4 MG tablet Take 4 mg by mouth 2 (two) times daily as needed for nausea or vomiting. 12/21/23   [provider]  oxyCODONE -acetaminophen  (PERCOCET/ROXICET) 5-325 MG per tablet Take 1 tablet by mouth every 4 (four) hours as needed for severe pain (pain score 7-10).    [provider]  pantoprazole  (PROTONIX ) 40 MG tablet Take 40 mg by mouth 2 (two) times daily.     [provider]  simvastatin  (ZOCOR ) 40 MG tablet Take  40 mg by mouth at bedtime. 09/22/21   [provider]  sucralfate  (CARAFATE ) 1 GM/10ML suspension Take 1 g by mouth 3 (three) times daily as needed (spasms). With Lidocaine  2%  and Mylanta    [provider]  tiZANidine (ZANAFLEX) 2 MG tablet Take 2 mg by mouth every 6 (six) hours as needed for muscle spasms. 01/31/24   [provider]  traMADol (ULTRAM) 50 MG tablet Take 50 mg by mouth every 6 (six) hours as needed for moderate pain.    [provider]  triamcinolone  (KENALOG ) 0.025 % ointment Apply 1 Application topically 2 (two) times daily as needed (irritation).    [provider]    Allergies: Crestor [rosuvastatin calcium ] and Latex    Review of Systems  Constitutional:  Positive for fatigue. Negative for chills, diaphoresis and fever.  HENT:  Negative for congestion, rhinorrhea and sneezing.   Eyes: Negative.   Respiratory:  Negative for cough, chest tightness and shortness of breath.   Cardiovascular:  Negative for chest pain and leg swelling.  Gastrointestinal:  Positive for abdominal pain, diarrhea and nausea. Negative for blood in stool and vomiting.  Genitourinary:  Negative for difficulty urinating, flank pain, frequency and hematuria.  Musculoskeletal:  Positive for neck pain. Negative for arthralgias and back pain.  Skin:  Negative for rash.  Neurological:  Positive for weakness (Generalized). Negative for dizziness, speech difficulty, numbness and headaches.    Updated Vital Signs BP (!) 88/42   Pulse (!) 53   Temp 98 F (36.7 C)   Resp 19   Ht 5' 10 (1.778 m)   Wt 109.3 kg   SpO2 98%   BMI 34.58 kg/m   Physical Exam Constitutional:      Appearance: He is well-developed.  HENT:     Head: Normocephalic and atraumatic.   Eyes:     Pupils: Pupils are equal, round, and reactive to light.    Cardiovascular:     Rate and Rhythm: Normal rate and regular rhythm.     Heart sounds: Normal heart sounds.  Pulmonary:      Effort: Pulmonary effort is normal. No respiratory distress.     Breath sounds: Normal breath sounds. No wheezing or rales.  Chest:     Chest wall: No tenderness.  Abdominal:     General: Bowel sounds are normal.     Palpations: Abdomen is soft.     Tenderness: There is no abdominal tenderness. There is no guarding or rebound.   Musculoskeletal:        General: Normal range of motion.     Cervical back: Normal range of motion and neck supple.  Lymphadenopathy:     Cervical: No cervical adenopathy.   Skin:  General: Skin is warm and dry.     Findings: No rash.   Neurological:     General: No focal deficit present.     Mental Status: He is alert and oriented to person, place, and time.     Comments: Motor 5 out of 5 all extremities, sensation grossly intact to light touch all extremities    (all labs ordered are listed, but only abnormal results are displayed) Labs Reviewed  COMPREHENSIVE METABOLIC PANEL WITH GFR - Abnormal; Notable for the following components:      Result Value   Sodium 134 (*)    Chloride 94 (*)    Creatinine, Ser 2.40 (*)    Calcium  8.3 (*)    Total Protein 5.0 (*)    Albumin 2.1 (*)    GFR, Estimated 28 (*)    All other components within normal limits  CBC WITH DIFFERENTIAL/PLATELET - Abnormal; Notable for the following components:   Hemoglobin 11.4 (*)    HCT 36.4 (*)    RDW 15.9 (*)    All other components within normal limits  URINALYSIS, W/ REFLEX TO CULTURE (INFECTION SUSPECTED) - Abnormal; Notable for the following components:   APPearance HAZY (*)    Bacteria, UA RARE (*)    All other components within normal limits  CULTURE, BLOOD (ROUTINE X 2)  CULTURE, BLOOD (ROUTINE X 2)  LIPASE, BLOOD  PROTIME-INR  I-STAT CG4 LACTIC ACID, ED  CBG MONITORING, ED  I-STAT CG4 LACTIC ACID, ED  I-STAT CG4 LACTIC ACID, ED  TROPONIN I (HIGH SENSITIVITY)  TROPONIN I (HIGH SENSITIVITY)    EKG: None  Radiology: DG Chest Port 1 View Result Date:  03/26/2024 CLINICAL DATA:  Questionable sepsis - evaluate for abnormality EXAM: PORTABLE CHEST - 1 VIEW COMPARISON:  None available. FINDINGS: No focal airspace consolidation, pleural effusion, or pneumothorax. No cardiomegaly. No acute fracture or destructive lesion. Partially visualized cervical fusion hardware again noted. IMPRESSION: No acute cardiopulmonary abnormality. Electronically Signed   By: Rogelia Myers M.D.   On: 03/26/2024 14:25     Procedures   Medications Ordered in the ED  sodium chloride  0.9 % bolus 1,000 mL (has no administration in time range)  lactated ringers infusion (has no administration in time range)  cefTRIAXone (ROCEPHIN) 2 g in sodium chloride  0.9 % 100 mL IVPB (has no administration in time range)  metroNIDAZOLE (FLAGYL) IVPB 500 mg (has no administration in time range)  sodium chloride  0.9 % bolus 1,000 mL (0 mLs Intravenous Stopped 03/26/24 1437)                                    Medical Decision Making Amount and/or Complexity of Data Reviewed Radiology: ordered.  Risk Prescription drug management.   This patient presents to the ED for concern of abdominal pain, fatigue, this involves an extensive number of treatment options, and is a complaint that carries with it a high risk of complications and morbidity.  I considered the following differential and admission for this acute, potentially life threatening condition.  The differential diagnosis includes sepsis, small bowel obstruction, mesenteric ischemia, appendicitis, cholecystitis, colitis, viral syndrome, pneumonia, UTI, dehydration  MDM:    Patient is a 68 year old with a history of Crohn's disease who presents with fatigue.  He was noted to be hypotensive.  He has diarrhea but this sounds more chronic for him.  He also has some abdominal pain which is somewhat  chronic as well.  He is not febrile.  Labs show a normal white count and normal lactate.  He got a liter of IV fluids and his blood  pressure is improving but still low.  Last blood pressure is 96 systolic.  Will give another liter of IV fluids and start antibiotics for possible sepsis.  He is immunocompromised on Humira.  Awaiting CT abdomen pelvis.  Care turned over to Dr. Jerrol.  CRITICAL CARE Performed by: Andrea Ness Total critical care time: 70 minutes Critical care time was exclusive of separately billable procedures and treating other patients. Critical care was necessary to treat or prevent imminent or life-threatening deterioration. Critical care was time spent personally by me on the following activities: development of treatment plan with patient and/or surrogate as well as nursing, discussions with consultants, evaluation of patient's response to treatment, examination of patient, obtaining history from patient or surrogate, ordering and performing treatments and interventions, ordering and review of laboratory studies, ordering and review of radiographic studies, pulse oximetry and re-evaluation of patient's condition.   (Labs, imaging, consults)  Labs: I Ordered, and personally interpreted labs.  The pertinent results include: Normal WBC count, normal lactate  Imaging Studies ordered: I ordered imaging studies including CT abdomen pelvis I independently visualized and interpreted imaging. I agree with the radiologist interpretation  Additional history obtained from chart.  External records from outside source obtained and reviewed including prior notes  Cardiac Monitoring: The patient was maintained on a cardiac monitor.  If on the cardiac monitor, I personally viewed and interpreted the cardiac monitored which showed an underlying rhythm of: Sinus rhythm  Reevaluation: After the interventions noted above, I reevaluated the patient and found that they have :improved  Social Determinants of Health:    Disposition: Pending  Co morbidities that complicate the patient evaluation  Past Medical  History:  Diagnosis Date   Abdominal pain 07/11/2013   ongoing and unspecified   Arthritis    Bladder stone 07/11/2013   surgery planned   CAD (coronary artery disease)    a. 12/2012 Cath/PCI: DES to superior branch of OM2;  b. Repeat cath 4/14 with 20% LAD, patent stent OM3, 70% diffuse stenosis small, non-dominant RCA.   Crohn's disease (HCC)    Essential hypertension    Headache    HLD (hyperlipidemia)    Renal disorder    renal calculi   Stomach pain    chronic     Medicines Meds ordered this encounter  Medications   sodium chloride  0.9 % bolus 1,000 mL   sodium chloride  0.9 % bolus 1,000 mL   lactated ringers infusion   cefTRIAXone (ROCEPHIN) 2 g in sodium chloride  0.9 % 100 mL IVPB    Antibiotic Indication::   Intra-abdominal   metroNIDAZOLE (FLAGYL) IVPB 500 mg    Antibiotic Indication::   Intra-abdominal Infection    I have reviewed the patients home medicines and have made adjustments as needed  Problem List / ED Course: Problem List Items Addressed This Visit   None            Final diagnoses:  None    ED Discharge Orders     None          Ness Andrea, MD 03/26/24 1532    Ness Andrea, MD 03/26/24 [phone]

## 2024-03-26 NOTE — ED Provider Notes (Addendum)
  Physical Exam  BP (!) 75/45   Pulse 63   Temp 98 F (36.7 C)   Resp 10   Ht 5' 10 (1.778 m)   Wt 109.3 kg   SpO2 92%   BMI 34.58 kg/m   Physical Exam  Procedures  .Critical Care  Performed by: Jerrol Agent, MD Authorized by: Jerrol Agent, MD   Critical care provider statement:    Critical care time (minutes):  30   Critical care was necessary to treat or prevent imminent or life-threatening deterioration of the following conditions:  Dehydration   Critical care was time spent personally by me on the following activities:  Development of treatment plan with patient or surrogate, discussions with consultants, evaluation of patient's response to treatment, examination of patient, ordering and review of laboratory studies, ordering and review of radiographic studies, ordering and performing treatments and interventions, pulse oximetry, re-evaluation of patient's condition and review of old charts   Care discussed with: admitting provider     ED Course / MDM    Medical Decision Making Amount and/or Complexity of Data Reviewed Radiology: ordered.  Risk Prescription drug management. Decision regarding hospitalization.   Russell presenting with nausea, fatigue, abdominal pain. Has chronic diarrhea. Endorsing neck pain. BP had been improving but now hypotensive again.    Waiting on CT imaging, BP better after fluids. Likely admit.  CT Abd Pel: IMPRESSION:  1. Diffuse thickened appearance of the colon suspicious of colitis.  No bowel obstruction. Normal appendix.  2. Multiple nonobstructing bilateral renal calculi. No  hydronephrosis or obstructing stone.  3. A 7 mm stone in the urinary bladder.  4. Fatty liver.  5.  Aortic Atherosclerosis (ICD10-I70.0).   CT Head:Neg CXR: Neg   Pt with persistent soft BP: Additional 500cc IVF bolus ordered. Pt with an AKI. Discussed admission for rehydration in the setting of dehydration with diarrhea and poor PO intake.  Lactic acid  normal, white count normal, afebrile, not meeting SIRS criteria at this time.  Hospitalist medicine consulted for admission, GI consult also placed given the patient's history of Crohn's disease with colitis seen on CT. Spoke with Dr. Rosalie of [facility] who will see the patient in consultation in the AM.   Jerrol Agent, MD 03/26/24 1821    Jerrol Agent, MD 03/26/24 [phone]

## 2024-03-26 NOTE — ED Triage Notes (Signed)
 Patient reports diarrhea hx of crohns.  Complains of dizziness and difficulty ambulating and feeling off.  Patient appears pale.  Sort had bp in the 60's Left arm 120/79 Right arm 71/48

## 2024-03-26 NOTE — H&P (Signed)
 History and Physical    Patient: Russell Padilla FMW:[id number] DOB: 1956-06-23 DOA: 03/26/2024 DOS: the patient was seen and examined on 03/26/2024 PCP: Seabron Lenis, MD  Patient coming from: Home  Chief Complaint:  Chief Complaint  Patient presents with   Dizziness   Diarrhea   HPI: Russell Padilla is a 68 y.o. male with medical history significant of Crohn's disease, essential hypertension, hyperlipidemia, CAD status post stenting who presented to the emergency room with chief complaint of abdominal pain as well as diarrhea.  Patient does have chronic diarrhea in the setting of inflammatory bowel disease.  He does take Carafate , Maalox and viscous lidocaine  to help with that.  Denies any chest pain, shortness of breath, headache or syncopal episodes.   Patient's multiple family members were present at bedside.  Patient said that he has chronic diarrhea and follows up with a GI.  He said that he had a colonoscopy done last year which showed evidence of possible Crohn's disease but he has not responded to Humira as well as high-dose steroids.  Patient was on 3 months course of 40 mg of prednisone  which finished almost a month back or so.  This was tapered off. He said that for the past 2 days, his symptoms are worse in terms of abdominal discomfort as well as diarrhea which is watery. Patient does have history of CAD status post stenting.  He did have cardiac catheterization procedure done 2 to 3 weeks back which did not show any occlusions enough to have another stenting procedure done. Upon arrival, patient's blood pressure was in the low side and did receive intravenous fluid in the emergency room with improvement in the blood pressure.  Patient has a history of Crohn's disease and is on Humira.  Currently the patient is not on home steroids.  CT of the abdomen pelvis was done which showed evidence of colitis.  Patient received IV ceftriaxone and Flagyl along with intravenous fluids in the  emergency department and decision was made to admit the patient to the hospital for the management of acute colitis in the setting of Crohn's disease.  Review of Systems: As mentioned in the history of present illness. All other systems reviewed and are negative. Past Medical History:  Diagnosis Date   Abdominal pain 07/11/2013   ongoing and unspecified   Arthritis    Bladder stone 07/11/2013   surgery planned   CAD (coronary artery disease)    a. 12/2012 Cath/PCI: DES to superior branch of OM2;  b. Repeat cath 4/14 with 20% LAD, patent stent OM3, 70% diffuse stenosis small, non-dominant RCA.   Crohn's disease (HCC)    Essential hypertension    Headache    HLD (hyperlipidemia)    Renal disorder    renal calculi   Stomach pain    chronic   Past Surgical History:  Procedure Laterality Date   BACK SURGERY     2 cervical/ 2 lumbar fusions   CARDIAC CATHETERIZATION N/A 03/04/2015   Procedure: Left Heart Cath and Coronary Angiography;  Surgeon: Lonni JONETTA Cash, MD;  Location: [facility] INVASIVE CV LAB;  Service: Cardiovascular;  Laterality: N/A;   CHOLECYSTECTOMY     laparoscopic.   CORONARY STENT PLACEMENT     3'14   ESOPHAGOGASTRODUODENOSCOPY ENDOSCOPY     multiple times-[facility]/ and now being followed by Endo- [facility]   KNEE ARTHROSCOPY Left    '80's   LEFT HEART CATH AND CORONARY ANGIOGRAPHY N/A 02/16/2024   Procedure: LEFT HEART CATH  AND CORONARY ANGIOGRAPHY;  Surgeon: Verlin Lonni BIRCH, MD;  Location: [facility] INVASIVE CV LAB;  Service: Cardiovascular;  Laterality: N/A;   LEFT HEART CATHETERIZATION WITH CORONARY ANGIOGRAM N/A 12/05/2012   Procedure: LEFT HEART CATHETERIZATION WITH CORONARY ANGIOGRAM;  Surgeon: Salena GORMAN Negri, MD;  Location: [facility] CATH LAB;  Service: Cardiovascular;  Laterality: N/A;   LEFT HEART CATHETERIZATION WITH CORONARY ANGIOGRAM N/A 01/17/2013   Procedure: LEFT HEART CATHETERIZATION WITH CORONARY ANGIOGRAM;  Surgeon: Deatrice DELENA Cage, MD;  Location: [facility] CATH  LAB;  Service: Cardiovascular;  Laterality: N/A;   PERCUTANEOUS CORONARY STENT INTERVENTION (PCI-S) N/A 12/06/2012   Procedure: PERCUTANEOUS CORONARY STENT INTERVENTION (PCI-S);  Surgeon: Lonni BIRCH Verlin, MD;  Location: [facility] CATH LAB;  Service: Cardiovascular;  Laterality: N/A;   Social History:  reports that he quit smoking about 11 years ago. His smoking use included cigarettes. He started smoking about 41 years ago. He has a 15 pack-year smoking history. He has never used smokeless tobacco. He reports that he does not drink alcohol and does not use drugs.  Allergies  Allergen Reactions   Crestor [Rosuvastatin Calcium ] Other (See Comments)    Aches    Latex Hives and Rash    When at dentist    Family History  Problem Relation Age of Onset   Stomach cancer Mother    Congestive Heart Failure Father    Dementia Father    Heart disease Brother     Prior to Admission medications   Medication Sig Start Date End Date Taking? Authorizing Provider  acetaminophen  (TYLENOL ) 500 MG tablet Take 1,000 mg by mouth every 8 (eight) hours as needed for moderate pain (pain score 4-6).    [provider]  adalimumab (HUMIRA) 40 MG/0.8ML AJKT Padilla Inject 40 mg into the skin every 14 (fourteen) days. 06/15/23   [provider]  Biotin (SUPER BIOTIN) 5 MG TABS Take 1 tablet by mouth daily.    [provider]  cholecalciferol (VITAMIN D3) 25 MCG (1000 UNIT) tablet Take 1,000 Units by mouth daily.    [provider]  dicyclomine  (BENTYL ) 20 MG tablet Take 20 mg by mouth 2 (two) times daily.    [provider]  Evolocumab  (REPATHA  SURECLICK) 140 MG/ML SOAJ Inject 140 mg into the skin every 14 (fourteen) days. 11/13/23   Verlin Lonni BIRCH, MD  fluticasone  (FLONASE ) 50 MCG/ACT nasal spray Place 1 spray into both nostrils at bedtime as needed for allergies. 01/20/15   [provider]  furosemide  (LASIX ) 20 MG tablet Take 1 tablet (20 mg total) by mouth  as needed. 12/15/23   Dunn, [name] N, PA-C  gabapentin  (NEURONTIN ) 300 MG capsule Take 1 tablet PO in the morning and take 2 tablets PO at bedtime. 08/03/15   Onita Duos, MD  ketoconazole (NIZORAL) 2 % cream Apply 1 Application topically daily as needed for irritation.    [provider]  loperamide (IMODIUM A-D) 2 MG tablet Take 2 mg by mouth 4 (four) times daily as needed for diarrhea or loose stools.    [provider]  mesalamine (LIALDA) 1.2 g EC tablet Take 2.4 g by mouth daily with breakfast.    [provider]  Multiple Vitamins-Minerals (MULTIVITAMIN WITH MINERALS) tablet Take 1 tablet by mouth daily. Centrum Silver    [provider]  nitroGLYCERIN  (NITROSTAT ) 0.4 MG SL tablet Place 1 tablet (0.4 mg total) under the tongue every 5 (five) minutes as needed for chest pain (up to 3 doses). 11/06/23   Dunn,  [name] N, PA-C  olmesartan  (BENICAR ) 20 MG tablet Take 0.5 tablets (10 mg total) by mouth daily. 02/05/24   Parthenia Olivia HERO, PA-C  ondansetron  (ZOFRAN ) 4 MG tablet Take 4 mg by mouth 2 (two) times daily as needed for nausea or vomiting. 12/21/23   [provider]  oxyCODONE -acetaminophen  (PERCOCET/ROXICET) 5-325 MG per tablet Take 1 tablet by mouth every 4 (four) hours as needed for severe pain (pain score 7-10).    [provider]  pantoprazole  (PROTONIX ) 40 MG tablet Take 40 mg by mouth 2 (two) times daily.     [provider]  simvastatin  (ZOCOR ) 40 MG tablet Take 40 mg by mouth at bedtime. 09/22/21   [provider]  sucralfate  (CARAFATE ) 1 GM/10ML suspension Take 1 g by mouth 3 (three) times daily as needed (spasms). With Lidocaine  2%  and Mylanta    [provider]  tiZANidine (ZANAFLEX) 2 MG tablet Take 2 mg by mouth every 6 (six) hours as needed for muscle spasms. 01/31/24   [provider]  traMADol (ULTRAM) 50 MG tablet Take 50 mg by mouth every 6 (six) hours as needed for moderate pain.    [provider]  triamcinolone  (KENALOG ) 0.025 % ointment Apply 1 Application topically 2 (two) times daily as needed (irritation).    [provider]    Physical Exam: Vitals:   03/26/24 1530 03/26/24 1640 03/26/24 1641 03/26/24 1710  BP: (!) 90/50 (!) 89/48  (!) 82/48  Pulse: (!) 54 62  (!) 58  Resp: 16 12  14   Temp:   98 F (36.7 C)   TempSrc:   Oral   SpO2: 100% 98%  93%  Weight:      Height:       Constitutional: NAD, calm, comfortable Eyes: PERRL, lids and conjunctivae normal ENMT: Mucous membranes are moist. Posterior pharynx clear of any exudate or lesions.Normal dentition.  Neck: normal, supple, no masses, no thyromegaly Respiratory: clear to auscultation bilaterally, no wheezing, no crackles. Normal respiratory effort. No accessory muscle use.  Cardiovascular: Regular rate and rhythm, no murmurs / rubs / gallops. BLE 1+ edema. 2+ pedal pulses. No carotid bruits.  Abdomen:Tenderness to palpation in lower quadrants, no masses palpated. No hepatosplenomegaly. Bowel sounds positive.  Musculoskeletal: no clubbing / cyanosis. No joint deformity upper and lower extremities. Good ROM, no contractures. Normal muscle tone.  Skin: no rashes, lesions, ulcers. No induration Neurologic: CN 2-12 grossly intact. Sensation intact, DTR normal. Strength 5/5 x all 4 extremities.  Psychiatric: Normal judgment and insight. Alert and oriented x 3. Normal mood.   Data Reviewed:  There are no new results to review at this time.  Assessment and Plan: 68 y.o. male with medical history significant of Crohn's disease, essential hypertension, hyperlipidemia, CAD status post stenting who presented to the emergency room with chief complaint of abdominal pain as well as diarrhea.  Acute colitis, POA: Seen in the CT of the abdomen pelvis.  Patient received intravenous ceftriaxone and Flagyl in the emergency room along with intravenous fluids. -Continue with intravenous antibiotics and intravenous  fluids.  Will keep the patient NPO.  Start on clear liquid diets once appropriate.  Advance diet as tolerated. -GI consulted.  Hypovolemic shock, POA: In the setting of diarrhea.  Patient was initially hypotensive but did respond to intravenous fluids.  So far, he has received 2.5 L of insulin.  Continue with maintenance fluids.  COVID-positive 5 mg.  Goal MAP of 65 and above  Acute kidney  injury, POA: Likely.  In the setting of diarrhea.  Continue with intravenous fluids and follow renal function panel closely.  Crohn's disease, POA: Follows up outpatient with GI.  He is on Humira.  He has been on and off of high-dose steroids.   Essential hypertension, POA: Patient's blood pressure was on low side in the ED so we will avoid any scheduled antihypertensives.  Check blood pressure closely.  Hyperlipidemia, POA: Follow lipid panel.  Patient is on Repatha  at home  CAD status post stenting, POA: Outpatient follow-up with cardiology.  No acute issues.  Disposition: Home.  Patient is independent with activities of daily life  DVT prophylaxis: SCDs.  Avoiding pharmacological prophylaxis because patient is having some blood in his stools.    Advance Care Planning: Full code  Consults: None  Family Communication: Family at Bedside  Severity of Illness: The appropriate patient status for this patient is INPATIENT. Inpatient status is judged to be reasonable and necessary in order to provide the required intensity of service to ensure the patient's safety. The patient's presenting symptoms, physical exam findings, and initial radiographic and laboratory data in the context of their chronic comorbidities is felt to place them at high risk for further clinical deterioration. Furthermore, it is not anticipated that the patient will be medically stable for discharge from the hospital within 2 midnights of admission.   * I certify that at the point of admission it is my clinical judgment that the  patient will require inpatient hospital care spanning beyond 2 midnights from the point of admission due to high intensity of service, high risk for further deterioration and high frequency of surveillance required.*  Author: Deliliah Room, MD 03/26/2024 5:44 PM  For on call review [email or web address].

## 2024-03-26 NOTE — ED Provider Triage Note (Signed)
 Emergency Medicine Provider Triage Evaluation Note  Russell Padilla , a 68 y.o. male  was evaluated in triage.  Pt complains of vision being altered, everything white since around 3 AM today.  He has also had tingling in his left arm since yesterday.  States he slept for the last 36 hours.  Having ongoing diarrhea which he attributes to his Crohn's disease.  States he was like this once before when he was really dehydrated..  Review of Systems  Positive: diarrhea Negative: fever  Physical Exam  BP 120/87 (BP Location: Left Arm)   Pulse 73   Temp 98 F (36.7 C)   Resp (!) 22   SpO2 100%  Gen:   Awake, no distress   Resp:  Normal effort  MSK:   Moves extremities without difficulty  Other:    Medical Decision Making  Medically screening exam initiated at 12:10 PM.  Appropriate orders placed.  Charlie LELON Mania was informed that the remainder of the evaluation will be completed by another provider, this initial triage assessment does not replace that evaluation, and the importance of remaining in the ED until their evaluation is complete.     Towana Ozell BROCKS, MD 03/26/24 [phone]

## 2024-03-26 NOTE — ED Notes (Signed)
 Phlebotomy notified to collect second set of blood cultures as soon as available.

## 2024-03-27 DIAGNOSIS — K529 Noninfective gastroenteritis and colitis, unspecified: Secondary | ICD-10-CM | POA: Diagnosis not present

## 2024-03-27 LAB — COMPREHENSIVE METABOLIC PANEL WITH GFR
ALT: 16 U/L (ref 0–44)
AST: 19 U/L (ref 15–41)
Albumin: 1.9 g/dL — ABNORMAL LOW (ref 3.5–5.0)
Alkaline Phosphatase: 37 U/L — ABNORMAL LOW (ref 38–126)
Anion gap: 4 — ABNORMAL LOW (ref 5–15)
BUN: 8 mg/dL (ref 8–23)
CO2: 25 mmol/L (ref 22–32)
Calcium: 7.5 mg/dL — ABNORMAL LOW (ref 8.9–10.3)
Chloride: 107 mmol/L (ref 98–111)
Creatinine, Ser: 1.6 mg/dL — ABNORMAL HIGH (ref 0.61–1.24)
GFR, Estimated: 46 mL/min — ABNORMAL LOW (ref >60)
Glucose, Bld: 74 mg/dL (ref 70–99)
Potassium: 3.6 mmol/L (ref 3.5–5.1)
Sodium: 136 mmol/L (ref 135–145)
Total Bilirubin: 0.6 mg/dL (ref 0.0–1.2)
Total Protein: 4.5 g/dL — ABNORMAL LOW (ref 6.5–8.1)

## 2024-03-27 LAB — CBC
HCT: 34 % — ABNORMAL LOW (ref 39.0–52.0)
Hemoglobin: 10.7 g/dL — ABNORMAL LOW (ref 13.0–17.0)
MCH: 26.8 pg (ref 26.0–34.0)
MCHC: 31.5 g/dL (ref 30.0–36.0)
MCV: 85 fL (ref 80.0–100.0)
Platelets: 216 10*3/uL (ref 150–400)
RBC: 4 MIL/uL — ABNORMAL LOW (ref 4.22–5.81)
RDW: 15.9 % — ABNORMAL HIGH (ref 11.5–15.5)
WBC: 6.3 10*3/uL (ref 4.0–10.5)
nRBC: 0 % (ref 0.0–0.2)

## 2024-03-27 LAB — CORTISOL-AM, BLOOD: Cortisol - AM: 8.8 ug/dL (ref 6.7–22.6)

## 2024-03-27 LAB — HIV ANTIBODY (ROUTINE TESTING W REFLEX): HIV Screen 4th Generation wRfx: NONREACTIVE

## 2024-03-27 MED ORDER — DICYCLOMINE HCL 20 MG PO TABS
20.0000 mg | ORAL_TABLET | Freq: Every day | ORAL | Status: DC
  Administered 2024-03-27 – 2024-04-02 (×7): 20 mg via ORAL
  Filled 2024-03-27 (×7): qty 1

## 2024-03-27 MED ORDER — HYDRALAZINE HCL 20 MG/ML IJ SOLN: 10.0000 mg | INTRAMUSCULAR | Status: DC | PRN

## 2024-03-27 MED ORDER — VITAMIN D 25 MCG (1000 UNIT) PO TABS
1000.0000 [IU] | ORAL_TABLET | Freq: Every day | ORAL | Status: DC
  Administered 2024-03-29 – 2024-04-03 (×6): 1000 [IU] via ORAL
  Filled 2024-03-27 (×7): qty 1

## 2024-03-27 MED ORDER — POLYETHYLENE GLYCOL 3350 17 GM/SCOOP PO POWD: 238.0000 g | Freq: Once | ORAL | Status: DC

## 2024-03-27 MED ORDER — GABAPENTIN 300 MG PO CAPS
300.0000 mg | ORAL_CAPSULE | Freq: Every morning | ORAL | Status: DC
  Administered 2024-03-29 – 2024-04-03 (×6): 300 mg via ORAL
  Filled 2024-03-27 (×7): qty 1

## 2024-03-27 MED ORDER — FLUTICASONE PROPIONATE 50 MCG/ACT NA SUSP
1.0000 | Freq: Every day | NASAL | Status: DC
  Administered 2024-03-27 – 2024-04-02 (×7): 1 via NASAL
  Filled 2024-03-27 (×3): qty 16

## 2024-03-27 MED ORDER — IPRATROPIUM-ALBUTEROL 0.5-2.5 (3) MG/3ML IN SOLN: 3.0000 mL | RESPIRATORY_TRACT | Status: DC | PRN

## 2024-03-27 MED ORDER — EVOLOCUMAB 140 MG/ML ~~LOC~~ SOAJ: 140.0000 mg | SUBCUTANEOUS | Status: DC

## 2024-03-27 MED ORDER — SALINE SPRAY 0.65 % NA SOLN
1.0000 | NASAL | Status: DC | PRN
  Administered 2024-03-31 – 2024-04-01 (×2): 1 via NASAL
  Filled 2024-03-27: qty 44

## 2024-03-27 MED ORDER — SODIUM CHLORIDE 0.9 % IV SOLN: INTRAVENOUS | Status: AC

## 2024-03-27 MED ORDER — SENNOSIDES-DOCUSATE SODIUM 8.6-50 MG PO TABS: 1.0000 | ORAL_TABLET | Freq: Every evening | ORAL | Status: DC | PRN

## 2024-03-27 MED ORDER — METOPROLOL TARTRATE 5 MG/5ML IV SOLN: 5.0000 mg | INTRAVENOUS | Status: DC | PRN

## 2024-03-27 MED ORDER — GABAPENTIN 300 MG PO CAPS: 300.0000 mg | ORAL_CAPSULE | ORAL | Status: DC

## 2024-03-27 MED ORDER — GABAPENTIN 300 MG PO CAPS
600.0000 mg | ORAL_CAPSULE | Freq: Every day | ORAL | Status: DC
  Administered 2024-03-29 – 2024-04-02 (×5): 600 mg via ORAL
  Filled 2024-03-27 (×7): qty 2

## 2024-03-27 MED ORDER — DICYCLOMINE HCL 20 MG PO TABS: 20.0000 mg | ORAL_TABLET | ORAL | Status: DC

## 2024-03-27 MED ORDER — POLYETHYLENE GLYCOL 3350 17 GM/SCOOP PO POWD
238.0000 g | Freq: Once | ORAL | Status: AC
  Administered 2024-03-27: 238 g via ORAL
  Filled 2024-03-27: qty 238

## 2024-03-27 MED ORDER — DICYCLOMINE HCL 20 MG PO TABS
40.0000 mg | ORAL_TABLET | Freq: Every morning | ORAL | Status: DC
  Administered 2024-03-29 – 2024-04-03 (×6): 40 mg via ORAL
  Filled 2024-03-27 (×8): qty 2

## 2024-03-27 MED ORDER — PANTOPRAZOLE SODIUM 40 MG PO TBEC
40.0000 mg | DELAYED_RELEASE_TABLET | Freq: Every day | ORAL | Status: DC
  Administered 2024-03-27 – 2024-04-03 (×7): 40 mg via ORAL
  Filled 2024-03-27 (×6): qty 1

## 2024-03-27 MED ORDER — GUAIFENESIN 100 MG/5ML PO LIQD: 5.0000 mL | ORAL | Status: DC | PRN

## 2024-03-27 NOTE — Plan of Care (Signed)

## 2024-03-27 NOTE — H&P (View-Only) (Signed)
 Referring Provider: Dr. Caleen Primary Care Physician:  Seabron Lenis, MD Primary Gastroenterologist:  Dr. Kriss  Reason for Consultation:  Diarrhea; Ulcerative Colitis  HPI: Russell Padilla is a 68 y.o. male with diagnosis of left-sided ulcerative colitis in January 2024 on colonoscopy by Dr. Kriss. Was started on Lialda 2.4 g/day at that time.  Due to persistent symptoms and rising fecal calprotectin he was put on a steroid taper last year.  He then was subsequently started on Humira but has continued to have profuse bloody diarrhea and abdominal pain.  He just recently saw Dr. Galvin on June 19 and his C. difficile test was negative.  He had a negative GI pathogen panel in March.  He reports multiple episodes of bloody diarrhea and cannot leave his house despite using Imodium.  Noncontrast CT scan showed diffuse thickening of the colon.  He is very frustrated with his ongoing symptoms without relief with treatment.  His chart says Crohn's disease but based on my review of his office records I see no evidence of Crohn's disease.  Past Medical History:  Diagnosis Date   Abdominal pain 07/11/2013   ongoing and unspecified   Arthritis    Bladder stone 07/11/2013   surgery planned   CAD (coronary artery disease)    a. 12/2012 Cath/PCI: DES to superior branch of OM2;  b. Repeat cath 4/14 with 20% LAD, patent stent OM3, 70% diffuse stenosis small, non-dominant RCA.   Crohn's disease (HCC)    Essential hypertension    Headache    HLD (hyperlipidemia)    Renal disorder    renal calculi   Stomach pain    chronic    Past Surgical History:  Procedure Laterality Date   BACK SURGERY     2 cervical/ 2 lumbar fusions   CARDIAC CATHETERIZATION N/A 03/04/2015   Procedure: Left Heart Cath and Coronary Angiography;  Surgeon: Lonni JONETTA Cash, MD;  Location: [facility] INVASIVE CV LAB;  Service: Cardiovascular;  Laterality: N/A;   CHOLECYSTECTOMY     laparoscopic.   CORONARY STENT PLACEMENT      3'14   ESOPHAGOGASTRODUODENOSCOPY ENDOSCOPY     multiple times-[facility]/ and now being followed by Endo- [facility]   KNEE ARTHROSCOPY Left    '80's   LEFT HEART CATH AND CORONARY ANGIOGRAPHY N/A 02/16/2024   Procedure: LEFT HEART CATH AND CORONARY ANGIOGRAPHY;  Surgeon: Cash Lonni JONETTA, MD;  Location: [facility] INVASIVE CV LAB;  Service: Cardiovascular;  Laterality: N/A;   LEFT HEART CATHETERIZATION WITH CORONARY ANGIOGRAM N/A 12/05/2012   Procedure: LEFT HEART CATHETERIZATION WITH CORONARY ANGIOGRAM;  Surgeon: Salena GORMAN Negri, MD;  Location: [facility] CATH LAB;  Service: Cardiovascular;  Laterality: N/A;   LEFT HEART CATHETERIZATION WITH CORONARY ANGIOGRAM N/A 01/17/2013   Procedure: LEFT HEART CATHETERIZATION WITH CORONARY ANGIOGRAM;  Surgeon: Deatrice DELENA Cage, MD;  Location: [facility] CATH LAB;  Service: Cardiovascular;  Laterality: N/A;   PERCUTANEOUS CORONARY STENT INTERVENTION (PCI-S) N/A 12/06/2012   Procedure: PERCUTANEOUS CORONARY STENT INTERVENTION (PCI-S);  Surgeon: Lonni JONETTA Cash, MD;  Location: [facility] CATH LAB;  Service: Cardiovascular;  Laterality: N/A;    Prior to Admission medications   Medication Sig Start Date End Date Taking? Authorizing Provider  acetaminophen  (TYLENOL ) 500 MG tablet Take 1,000 mg by mouth every 8 (eight) hours as needed for moderate pain (pain score 4-6).   Yes [provider]  adalimumab (HUMIRA) 40 MG/0.8ML AJKT pen Inject 40 mg into the skin every 14 (fourteen) days. 06/15/23  Yes [provider]  Biotin (SUPER BIOTIN) 5 MG TABS Take 1 tablet by mouth daily.   Yes [provider]  cholecalciferol (VITAMIN D3) 25 MCG (1000 UNIT) tablet Take 1,000 Units by mouth daily.   Yes [provider]  dicyclomine  (BENTYL ) 20 MG tablet Take 20-40 mg by mouth See admin instructions. Take two tablets by mouth in the monrning and then take 1 tablet by mouth in the evening per patient   Yes [provider]  Evolocumab  (REPATHA  SURECLICK) 140  MG/ML SOAJ Inject 140 mg into the skin every 14 (fourteen) days. 11/13/23  Yes Verlin Lonni BIRCH, MD  fluticasone  (FLONASE ) 50 MCG/ACT nasal spray Place 1 spray into both nostrils at bedtime as needed for allergies. 01/20/15  Yes [provider]  furosemide  (LASIX ) 20 MG tablet Take 1 tablet (20 mg total) by mouth as needed. Patient taking differently: Take 20 mg by mouth daily as needed for fluid. 12/15/23  Yes Dunn, [name] N, PA-C  gabapentin  (NEURONTIN ) 300 MG capsule Take 1 tablet PO in the morning and take 2 tablets PO at bedtime. Patient taking differently: Take 300-600 mg by mouth See admin instructions. Take one capsule by mouth in the morning and then take 2 capsules by mouth at bedtime per patient 08/03/15  Yes Onita Duos, MD  ketoconazole (NIZORAL) 2 % cream Apply 1 Application topically daily as needed for irritation.   Yes [provider]  loperamide (IMODIUM A-D) 2 MG tablet Take 2 mg by mouth 4 (four) times daily as needed for diarrhea or loose stools.   Yes [provider]  mesalamine (LIALDA) 1.2 g EC tablet Take 2.4 g by mouth daily with breakfast.   Yes [provider]  Multiple Vitamins-Minerals (MULTIVITAMIN WITH MINERALS) tablet Take 1 tablet by mouth daily. Centrum Silver   Yes [provider]  olmesartan  (BENICAR ) 20 MG tablet Take 0.5 tablets (10 mg total) by mouth daily. 02/05/24  Yes Parthenia Olivia HERO, PA-C  oxyCODONE -acetaminophen  (PERCOCET/ROXICET) 5-325 MG per tablet Take 1 tablet by mouth every 4 (four) hours as needed for severe pain (pain score 7-10).   Yes [provider]  pantoprazole  (PROTONIX ) 40 MG tablet Take 40 mg by mouth 2 (two) times daily.    Yes [provider]  simvastatin  (ZOCOR ) 40 MG tablet Take 40 mg by mouth at bedtime. 09/22/21  Yes [provider]  sucralfate  (CARAFATE ) 1 GM/10ML suspension Take 1 g by mouth 3 (three) times daily as needed (spasms). With Lidocaine  2%  and Mylanta    Yes [provider]  tiZANidine (ZANAFLEX) 2 MG tablet Take 2 mg by mouth every 6 (six) hours as needed for muscle spasms. 01/31/24  Yes [provider]  traMADol (ULTRAM) 50 MG tablet Take 50 mg by mouth every 6 (six) hours as needed for moderate pain.   Yes [provider]  triamcinolone  (KENALOG ) 0.025 % ointment Apply 1 Application topically 2 (two) times daily as needed (irritation).   Yes [provider]  nitroGLYCERIN  (NITROSTAT ) 0.4 MG SL tablet Place 1 tablet (0.4 mg total) under the tongue every 5 (five) minutes as needed for chest pain (up to 3 doses). 11/06/23   Dunn, [name] N, PA-C  ondansetron  (ZOFRAN ) 4 MG tablet Take 4 mg by mouth 2 (two) times daily as needed for nausea or vomiting. 12/21/23   [provider]    Scheduled Meds:  cholecalciferol  1,000 Units Oral Daily   dicyclomine   40 mg Oral q morning   And  dicyclomine   20 mg Oral QHS   gabapentin   300 mg Oral q morning   And   gabapentin   600 mg Oral QHS   sodium chloride  flush  3 mL Intravenous Q12H   Continuous Infusions:  sodium chloride  125 mL/hr at 03/27/24 1110   cefTRIAXone (ROCEPHIN)  IV Stopped (03/27/24 1015)   metronidazole 500 mg (03/27/24 1111)   PRN Meds:.acetaminophen  **OR** acetaminophen , guaiFENesin, hydrALAZINE , ipratropium-albuterol , metoprolol  tartrate, ondansetron  **OR** ondansetron  (ZOFRAN ) IV, senna-docusate  Allergies as of 03/26/2024 - Review Complete 03/26/2024  Allergen Reaction Noted   Crestor [rosuvastatin calcium ] Other (See Comments) 06/29/2017   Latex Hives and Rash 11/27/2009    Family History  Problem Relation Age of Onset   Stomach cancer Mother    Congestive Heart Failure Father    Dementia Father    Heart disease Brother     Social History   Socioeconomic History   Marital status: Married    Spouse name: Not on file   Number of children: 0   Years of education: 14   Highest education level: Not on file  Occupational  History   Occupation: Retired  Tobacco Use   Smoking status: Former    Current packs/day: 0.00    Average packs/day: 0.5 packs/day for 30.0 years (15.0 ttl pk-yrs)    Types: Cigarettes    Start date: 12/05/1982    Quit date: 12/04/2012    Years since quitting: 11.3   Smokeless tobacco: Never  Vaping Use   Vaping status: Never Used  Substance and Sexual Activity   Alcohol use: No    Alcohol/week: 0.0 standard drinks of alcohol   Drug use: No   Sexual activity: Not Currently  Other Topics Concern   Not on file  Social History Narrative   Lives at home with his wife, Russell Padilla.   Right-handed.   4-5 cans diet colas and occasional glasses of tea per day.   Retired 2007.   Social Drivers of Corporate investment banker Strain: Not on file  Food Insecurity: Not on file  Transportation Needs: Not on file  Physical Activity: Not on file  Stress: Not on file  Social Connections: Unknown (02/14/2022)   Received [facility]   Social Network    Social Network: Not on file  Intimate Partner Violence: Unknown (01/06/2022)   Received from [facility]   HITS    Physically Hurt: Not on file    Insult or Talk Down To: Not on file    Threaten Physical Harm: Not on file    Scream or Curse: Not on file    Review of Systems: All negative except as stated above in HPI.  Physical Exam: Vital signs: Vitals:   03/27/24 0930 03/27/24 1100  BP: (!) 110/57 113/62  Pulse: 61 72  Resp: 17 16  Temp:    SpO2: 91% 90%  T 98   General: Lethargic, well-nourished, no acute distress  Head: normocephalic, atraumatic Eyes: anicteric sclera ENT: oropharynx clear Neck: supple, nontender Lungs:  Clear throughout to auscultation.   No wheezes, crackles, or rhonchi. No acute distress. Heart:  Regular rate and rhythm; no murmurs, clicks, rubs,  or gallops. Abdomen: Lower quadrant tenderness with guarding, soft, nondistended, positive bowel sounds Rectal:  Deferred Ext: no edema  GI:  Lab  Results: Recent Labs    03/26/24 1248 03/27/24 0500  WBC 5.7 6.3  HGB 11.4* 10.7*  HCT 36.4* 34.0*  PLT 252 216   BMET Recent Labs  03/26/24 1248 03/27/24 0500  NA 134* 136  K 3.5 3.6  CL 94* 107  CO2 27 25  GLUCOSE 95 74  BUN 12 8  CREATININE 2.40* 1.60*  CALCIUM  8.3* 7.5*   LFT Recent Labs    03/27/24 0500  PROT 4.5*  ALBUMIN 1.9*  AST 19  ALT 16  ALKPHOS 37*  BILITOT 0.6   PT/INR Recent Labs    03/26/24 1248  LABPROT 14.3  INR 1.1     Studies/Results:  Impression/Plan: 68 year old man with chronic bloody diarrhea in the setting of left-sided ulcerative colitis seen on colonoscopy in January 2024.  Failed to respond to Humira and steroids.  Evaluation for infectious diarrhea has been negative this year.  Needs an updated colonoscopy to further evaluate the extent of his inflammatory bowel disease.  To my review of his records I do not see any evidence of Crohn's disease but that will be reevaluated during his colonoscopy.  Will give him MiraLAX prep for his colonoscopy.  Clear liquid diet today.  Based on endoscopy availability colonoscopy will occur on Friday, June 27.   LOS: 1 day   [name]  03/27/2024, 11:19 AM  Questions please call [phone]

## 2024-03-27 NOTE — Hospital Course (Addendum)
 Brief Narrative:  68 year old with history of Crohn's disease, HTN, HLD, CAD status post stenting presented to the ED with chief complaints of abdominal pain and diarrhea.  He does have chronic diarrhea, had a colonoscopy about a year ago showing possible Crohn's disease.  He has not responded very well to Humira or high-dose steroids.  Recent corticosteroid was tapered off about a month ago. During this ER visit CT shows evidence of colitis therefore started on IV Rocephin and Flagyl.  GI has been consulted   Assessment & Plan:  Principal Problem:   Colitis    Acute colitis, POA History of Crohn's disease Chronic diarrhea -Colitis seen on CT scan.  I think this is noninfectious in nature therefore we will discontinue antibiotics.  [facility] has been consulted, planning on colonoscopy tomorrow. -Unable to provide proper stool sample for C. difficile/GI panel testing.   Hypovolemic shock, POA - Responded well to IV fluids   Acute kidney injury, POA:  -Admission creatinine 2.4, baseline creatinine 1.2.  Improving with IV fluids, creatinine 1.6  Nonobstructive bilateral renal stones - IV fluids  Essential hypertension Due to AKI Home ARB on hold.  Getting IV fluids.  IV as needed   Hyperlipidemia  Follow lipid panel.  Patient is on Repatha  at home   CAD status post stenting, POA Most recent cardiac cath 02/16/2024 showing stable three-vessel disease.  Recommending medical management   DVT prophylaxis: SCDs Start: 03/26/24 1820    Code Status: Full Code Family Communication: Nephew at bedside Continue hospital stay for GI evaluation   Subjective: Patient tells me he feels much better today after getting IV hydration and improvement in blood pressure. Still having clear loose mucousy stool with some slight blood   Examination:  General exam: Appears calm and comfortable  Respiratory system: Clear to auscultation. Respiratory effort normal. Cardiovascular system: S1 & S2  heard, RRR. No JVD, murmurs, rubs, gallops or clicks. No pedal edema. Gastrointestinal system: Abdomen is nondistended, soft and nontender. No organomegaly or masses felt. Normal bowel sounds heard. Central nervous system: Alert and oriented. No focal neurological deficits. Extremities: Symmetric 5 x 5 power. Skin: No rashes, lesions or ulcers Psychiatry: Judgement and insight appear normal. Mood & affect appropriate.

## 2024-03-27 NOTE — Progress Notes (Signed)
 PROGRESS NOTE    Russell Padilla  FMW:[id number] DOB: 11-08-1955 DOA: 03/26/2024 PCP: Seabron Lenis, MD    Brief Narrative:  68 year old with history of Crohn's disease, HTN, HLD, CAD status post stenting presented to the ED with chief complaints of abdominal pain and diarrhea.  He does have chronic diarrhea, had a colonoscopy about a year ago showing possible Crohn's disease.  He has not responded very well to Humira or high-dose steroids.  Recent corticosteroid was tapered off about a month ago. During this ER visit CT shows evidence of colitis therefore started on IV Rocephin and Flagyl.  GI has been consulted   Assessment & Plan:  Principal Problem:   Colitis    Acute colitis, POA History of Crohn's disease Chronic diarrhea -Colitis seen on CT scan.  I think this is noninfectious in nature therefore we will discontinue antibiotics.  [facility] has been consulted, planning on colonoscopy tomorrow. -Unable to provide proper stool sample for C. difficile/GI panel testing.   Hypovolemic shock, POA - Responded well to IV fluids   Acute kidney injury, POA:  -Admission creatinine 2.4, baseline creatinine 1.2.  Improving with IV fluids, creatinine 1.6  Nonobstructive bilateral renal stones - IV fluids  Essential hypertension Due to AKI Home ARB on hold.  Getting IV fluids.  IV as needed   Hyperlipidemia  Follow lipid panel.  Patient is on Repatha  at home   CAD status post stenting, POA Most recent cardiac cath 02/16/2024 showing stable three-vessel disease.  Recommending medical management   DVT prophylaxis: SCDs Start: 03/26/24 1820    Code Status: Full Code Family Communication: Nephew at bedside Continue hospital stay for GI evaluation   Subjective: Patient tells me he feels much better today after getting IV hydration and improvement in blood pressure. Still having clear loose mucousy stool with some slight blood   Examination:  General exam: Appears calm and  comfortable  Respiratory system: Clear to auscultation. Respiratory effort normal. Cardiovascular system: S1 & S2 heard, RRR. No JVD, murmurs, rubs, gallops or clicks. No pedal edema. Gastrointestinal system: Abdomen is nondistended, soft and nontender. No organomegaly or masses felt. Normal bowel sounds heard. Central nervous system: Alert and oriented. No focal neurological deficits. Extremities: Symmetric 5 x 5 power. Skin: No rashes, lesions or ulcers Psychiatry: Judgement and insight appear normal. Mood & affect appropriate.                Diet Orders (From admission, onward)     Start     Ordered   03/28/24 0001  Diet NPO time specified  Diet effective midnight        03/27/24 1207   03/27/24 1035  Diet clear liquid Fluid consistency: Thin  Diet effective now       Question:  Fluid consistency:  Answer:  Thin   03/27/24 1034            Objective: Vitals:   03/27/24 0908 03/27/24 0915 03/27/24 0930 03/27/24 1100  BP: (!) 151/126 (!) 108/54 (!) 110/57 113/62  Pulse: 66 65 61 72  Resp: 19 19 17 16   Temp: 98 F (36.7 C)   98.1 F (36.7 C)  TempSrc: Oral   Oral  SpO2: 100% 97% 91% 90%  Weight:      Height:        Intake/Output Summary (Last 24 hours) at 03/27/2024 1319 Last data filed at 03/27/2024 1200 Gross per 24 hour  Intake 2230.46 ml  Output --  Net 2230.46 ml  Filed Weights   03/26/24 1212  Weight: 109.3 kg    Scheduled Meds:  cholecalciferol  1,000 Units Oral Daily   dicyclomine   40 mg Oral q morning   And   dicyclomine   20 mg Oral QHS   gabapentin   300 mg Oral q morning   And   gabapentin   600 mg Oral QHS   polyethylene glycol powder  238 g Oral Once   sodium chloride  flush  3 mL Intravenous Q12H   Continuous Infusions:  sodium chloride      sodium chloride       Nutritional status     Body mass index is 34.58 kg/m.  Data Reviewed:   CBC: Recent Labs  Lab 03/26/24 1248 03/27/24 0500  WBC 5.7 6.3  NEUTROABS 3.7  --    HGB 11.4* 10.7*  HCT 36.4* 34.0*  MCV 84.8 85.0  PLT 252 216   Basic Metabolic Panel: Recent Labs  Lab 03/26/24 1248 03/27/24 0500  NA 134* 136  K 3.5 3.6  CL 94* 107  CO2 27 25  GLUCOSE 95 74  BUN 12 8  CREATININE 2.40* 1.60*  CALCIUM  8.3* 7.5*   GFR: Estimated Creatinine Clearance: 53.9 mL/min (A) (by C-G formula based on SCr of 1.6 mg/dL (H)). Liver Function Tests: Recent Labs  Lab 03/26/24 1248 03/27/24 0500  AST 19 19  ALT 17 16  ALKPHOS 41 37*  BILITOT 0.7 0.6  PROT 5.0* 4.5*  ALBUMIN 2.1* 1.9*   Recent Labs  Lab 03/26/24 1248  LIPASE 26   No results for input(s): AMMONIA in the last 168 hours. Coagulation Profile: Recent Labs  Lab 03/26/24 1248  INR 1.1   Cardiac Enzymes: No results for input(s): CKTOTAL, CKMB, CKMBINDEX, TROPONINI in the last 168 hours. BNP (last 3 results) No results for input(s): PROBNP in the last 8760 hours. HbA1C: No results for input(s): HGBA1C in the last 72 hours. CBG: No results for input(s): GLUCAP in the last 168 hours. Lipid Profile: No results for input(s): CHOL, HDL, LDLCALC, TRIG, CHOLHDL, LDLDIRECT in the last 72 hours. Thyroid Function Tests: No results for input(s): TSH, T4TOTAL, FREET4, T3FREE, THYROIDAB in the last 72 hours. Anemia Panel: No results for input(s): VITAMINB12, FOLATE, FERRITIN, TIBC, IRON, RETICCTPCT in the last 72 hours. Sepsis Labs: Recent Labs  Lab 03/26/24 1329 03/26/24 1452  LATICACIDVEN 1.3 0.7    Recent Results (from the past 240 hours)  Blood Culture (routine x 2)     Status: None (Preliminary result)   Collection Time: 03/26/24  1:53 PM   Specimen: BLOOD RIGHT ARM  Result Value Ref Range Status   Specimen Description BLOOD RIGHT ARM  Final   Special Requests   Final    BOTTLES DRAWN AEROBIC AND ANAEROBIC Blood Culture results may not be optimal due to an inadequate volume of blood received in culture bottles   Culture    Final    NO GROWTH < 24 HOURS Performed at [facility], [address], [place], [place] [address]    Report Status PENDING  Incomplete  Blood Culture (routine x 2)     Status: None (Preliminary result)   Collection Time: 03/26/24  1:58 PM   Specimen: BLOOD RIGHT HAND  Result Value Ref Range Status   Specimen Description BLOOD RIGHT HAND  Final   Special Requests   Final    BOTTLES DRAWN AEROBIC ONLY Blood Culture results may not be optimal due to an inadequate volume of blood received in culture  bottles   Culture   Final    NO GROWTH < 24 HOURS Performed at [facility], [address], [place], [place] [address]    Report Status PENDING  Incomplete         Radiology Studies: CT ABDOMEN PELVIS WO CONTRAST Result Date: 03/26/2024 CLINICAL DATA:  Sepsis. EXAM: CT ABDOMEN AND PELVIS WITHOUT CONTRAST TECHNIQUE: Multidetector CT imaging of the abdomen and pelvis was performed following the standard protocol without IV contrast. RADIATION DOSE REDUCTION: This exam was performed according to the departmental dose-optimization program which includes automated exposure control, adjustment of the mA and/or kV according to patient size and/or use of iterative reconstruction technique. COMPARISON:  CT abdomen pelvis dated 08/24/2015. FINDINGS: Evaluation of this exam is limited in the absence of intravenous contrast. Lower chest: Minimal bibasilar subpleural atelectasis. A 7 mm para fissural lymph node versus nodule along the right major fissure. Coronary vascular calcification. No intra-abdominal free air or free fluid. Hepatobiliary: Fatty liver. Cholecystectomy and pneumobilia. No retained calcified stone noted in the central CBD. Pancreas: Unremarkable. No pancreatic ductal dilatation or surrounding inflammatory changes. Spleen: Normal in size without focal abnormality. Adrenals/Urinary Tract: The adrenal glands unremarkable. Multiple nonobstructing bilateral renal calculi measure up  to 5 mm. No hydronephrosis or obstructing stone. The visualized ureters appear unremarkable. The urinary bladder is minimally distended. There is a 7 mm stone in the urinary bladder. Stomach/Bowel: Diffuse thickened appearance of the colon suspicious of colitis. There is no bowel obstruction. The appendix is normal. Vascular/Lymphatic: Moderate aortoiliac atherosclerotic disease. The IVC is unremarkable. No portal venous gas. There is no adenopathy. Reproductive: The prostate and seminal vesicles are grossly remarkable. Other: Small fat containing bilateral inguinal hernias. Partially visualized bilateral hydroceles. Musculoskeletal: Degenerative changes of the spine and lower lumbar fusion. No acute osseous pathology. IMPRESSION: 1. Diffuse thickened appearance of the colon suspicious of colitis. No bowel obstruction. Normal appendix. 2. Multiple nonobstructing bilateral renal calculi. No hydronephrosis or obstructing stone. 3. A 7 mm stone in the urinary bladder. 4. Fatty liver. 5.  Aortic Atherosclerosis (ICD10-I70.0). Electronically Signed   By: Vanetta Chou M.D.   On: 03/26/2024 16:09   CT Head Wo Contrast Result Date: 03/26/2024 CLINICAL DATA:  Headache. Increasing frequency or severity. Dizziness. Difficulty walking. EXAM: CT HEAD WITHOUT CONTRAST TECHNIQUE: Contiguous axial images were obtained from the base of the skull through the vertex without intravenous contrast. RADIATION DOSE REDUCTION: This exam was performed according to the departmental dose-optimization program which includes automated exposure control, adjustment of the mA and/or kV according to patient size and/or use of iterative reconstruction technique. COMPARISON:  None Available. FINDINGS: Brain: The brain shows a normal appearance without evidence of malformation, atrophy, old or acute small or large vessel infarction, mass lesion, hemorrhage, hydrocephalus or extra-axial collection. Vascular: No hyperdense vessel. No evidence of  atherosclerotic calcification. Skull: Normal.  No traumatic finding.  No focal bone lesion. Sinuses/Orbits: Sinuses are clear. Orbits appear normal. Mastoids are clear. Other: None significant IMPRESSION: Normal head CT. Electronically Signed   By: Oneil Officer M.D.   On: 03/26/2024 16:07   DG Chest Port 1 View Result Date: 03/26/2024 CLINICAL DATA:  Questionable sepsis - evaluate for abnormality EXAM: PORTABLE CHEST - 1 VIEW COMPARISON:  None available. FINDINGS: No focal airspace consolidation, pleural effusion, or pneumothorax. No cardiomegaly. No acute fracture or destructive lesion. Partially visualized cervical fusion hardware again noted. IMPRESSION: No acute cardiopulmonary abnormality. Electronically Signed   By: Rogelia Carlean HERO.D.  On: 03/26/2024 14:25           LOS: 1 day   Time spent= 35 mins    Burgess JAYSON Dare, MD [facility]  If 7PM-7AM, please contact night-coverage  03/27/2024, 1:19 PM

## 2024-03-27 NOTE — Consult Note (Signed)
 Referring Provider: Dr. Caleen Primary Care Physician:  Seabron Lenis, MD Primary Gastroenterologist:  Dr. Kriss  Reason for Consultation:  Diarrhea; Ulcerative Colitis  HPI: Russell Padilla is a 68 y.o. male with diagnosis of left-sided ulcerative colitis in January 2024 on colonoscopy by Dr. Kriss. Was started on Lialda 2.4 g/day at that time.  Due to persistent symptoms and rising fecal calprotectin he was put on a steroid taper last year.  He then was subsequently started on Humira but has continued to have profuse bloody diarrhea and abdominal pain.  He just recently saw Dr. Galvin on June 19 and his C. difficile test was negative.  He had a negative GI pathogen panel in March.  He reports multiple episodes of bloody diarrhea and cannot leave his house despite using Imodium.  Noncontrast CT scan showed diffuse thickening of the colon.  He is very frustrated with his ongoing symptoms without relief with treatment.  His chart says Crohn's disease but based on my review of his office records I see no evidence of Crohn's disease.  Past Medical History:  Diagnosis Date   Abdominal pain 07/11/2013   ongoing and unspecified   Arthritis    Bladder stone 07/11/2013   surgery planned   CAD (coronary artery disease)    a. 12/2012 Cath/PCI: DES to superior branch of OM2;  b. Repeat cath 4/14 with 20% LAD, patent stent OM3, 70% diffuse stenosis small, non-dominant RCA.   Crohn's disease (HCC)    Essential hypertension    Headache    HLD (hyperlipidemia)    Renal disorder    renal calculi   Stomach pain    chronic    Past Surgical History:  Procedure Laterality Date   BACK SURGERY     2 cervical/ 2 lumbar fusions   CARDIAC CATHETERIZATION N/A 03/04/2015   Procedure: Left Heart Cath and Coronary Angiography;  Surgeon: Lonni JONETTA Cash, MD;  Location: [facility] INVASIVE CV LAB;  Service: Cardiovascular;  Laterality: N/A;   CHOLECYSTECTOMY     laparoscopic.   CORONARY STENT PLACEMENT      3'14   ESOPHAGOGASTRODUODENOSCOPY ENDOSCOPY     multiple times-[facility]/ and now being followed by Endo- [facility]   KNEE ARTHROSCOPY Left    '80's   LEFT HEART CATH AND CORONARY ANGIOGRAPHY N/A 02/16/2024   Procedure: LEFT HEART CATH AND CORONARY ANGIOGRAPHY;  Surgeon: Cash Lonni JONETTA, MD;  Location: [facility] INVASIVE CV LAB;  Service: Cardiovascular;  Laterality: N/A;   LEFT HEART CATHETERIZATION WITH CORONARY ANGIOGRAM N/A 12/05/2012   Procedure: LEFT HEART CATHETERIZATION WITH CORONARY ANGIOGRAM;  Surgeon: Salena GORMAN Negri, MD;  Location: [facility] CATH LAB;  Service: Cardiovascular;  Laterality: N/A;   LEFT HEART CATHETERIZATION WITH CORONARY ANGIOGRAM N/A 01/17/2013   Procedure: LEFT HEART CATHETERIZATION WITH CORONARY ANGIOGRAM;  Surgeon: Deatrice DELENA Cage, MD;  Location: [facility] CATH LAB;  Service: Cardiovascular;  Laterality: N/A;   PERCUTANEOUS CORONARY STENT INTERVENTION (PCI-S) N/A 12/06/2012   Procedure: PERCUTANEOUS CORONARY STENT INTERVENTION (PCI-S);  Surgeon: Lonni JONETTA Cash, MD;  Location: [facility] CATH LAB;  Service: Cardiovascular;  Laterality: N/A;    Prior to Admission medications   Medication Sig Start Date End Date Taking? Authorizing Provider  acetaminophen  (TYLENOL ) 500 MG tablet Take 1,000 mg by mouth every 8 (eight) hours as needed for moderate pain (pain score 4-6).   Yes [provider]  adalimumab (HUMIRA) 40 MG/0.8ML AJKT pen Inject 40 mg into the skin every 14 (fourteen) days. 06/15/23  Yes [provider]  Biotin (SUPER BIOTIN) 5 MG TABS Take 1 tablet by mouth daily.   Yes [provider]  cholecalciferol (VITAMIN D3) 25 MCG (1000 UNIT) tablet Take 1,000 Units by mouth daily.   Yes [provider]  dicyclomine  (BENTYL ) 20 MG tablet Take 20-40 mg by mouth See admin instructions. Take two tablets by mouth in the monrning and then take 1 tablet by mouth in the evening per patient   Yes [provider]  Evolocumab  (REPATHA  SURECLICK) 140  MG/ML SOAJ Inject 140 mg into the skin every 14 (fourteen) days. 11/13/23  Yes Verlin Lonni BIRCH, MD  fluticasone  (FLONASE ) 50 MCG/ACT nasal spray Place 1 spray into both nostrils at bedtime as needed for allergies. 01/20/15  Yes [provider]  furosemide  (LASIX ) 20 MG tablet Take 1 tablet (20 mg total) by mouth as needed. Patient taking differently: Take 20 mg by mouth daily as needed for fluid. 12/15/23  Yes Dunn, [name] N, PA-C  gabapentin  (NEURONTIN ) 300 MG capsule Take 1 tablet PO in the morning and take 2 tablets PO at bedtime. Patient taking differently: Take 300-600 mg by mouth See admin instructions. Take one capsule by mouth in the morning and then take 2 capsules by mouth at bedtime per patient 08/03/15  Yes Onita Duos, MD  ketoconazole (NIZORAL) 2 % cream Apply 1 Application topically daily as needed for irritation.   Yes [provider]  loperamide (IMODIUM A-D) 2 MG tablet Take 2 mg by mouth 4 (four) times daily as needed for diarrhea or loose stools.   Yes [provider]  mesalamine (LIALDA) 1.2 g EC tablet Take 2.4 g by mouth daily with breakfast.   Yes [provider]  Multiple Vitamins-Minerals (MULTIVITAMIN WITH MINERALS) tablet Take 1 tablet by mouth daily. Centrum Silver   Yes [provider]  olmesartan  (BENICAR ) 20 MG tablet Take 0.5 tablets (10 mg total) by mouth daily. 02/05/24  Yes Parthenia Olivia HERO, PA-C  oxyCODONE -acetaminophen  (PERCOCET/ROXICET) 5-325 MG per tablet Take 1 tablet by mouth every 4 (four) hours as needed for severe pain (pain score 7-10).   Yes [provider]  pantoprazole  (PROTONIX ) 40 MG tablet Take 40 mg by mouth 2 (two) times daily.    Yes [provider]  simvastatin  (ZOCOR ) 40 MG tablet Take 40 mg by mouth at bedtime. 09/22/21  Yes [provider]  sucralfate  (CARAFATE ) 1 GM/10ML suspension Take 1 g by mouth 3 (three) times daily as needed (spasms). With Lidocaine  2%  and Mylanta    Yes [provider]  tiZANidine (ZANAFLEX) 2 MG tablet Take 2 mg by mouth every 6 (six) hours as needed for muscle spasms. 01/31/24  Yes [provider]  traMADol (ULTRAM) 50 MG tablet Take 50 mg by mouth every 6 (six) hours as needed for moderate pain.   Yes [provider]  triamcinolone  (KENALOG ) 0.025 % ointment Apply 1 Application topically 2 (two) times daily as needed (irritation).   Yes [provider]  nitroGLYCERIN  (NITROSTAT ) 0.4 MG SL tablet Place 1 tablet (0.4 mg total) under the tongue every 5 (five) minutes as needed for chest pain (up to 3 doses). 11/06/23   Dunn, [name] N, PA-C  ondansetron  (ZOFRAN ) 4 MG tablet Take 4 mg by mouth 2 (two) times daily as needed for nausea or vomiting. 12/21/23   [provider]    Scheduled Meds:  cholecalciferol  1,000 Units Oral Daily   dicyclomine   40 mg Oral q morning   And  dicyclomine   20 mg Oral QHS   gabapentin   300 mg Oral q morning   And   gabapentin   600 mg Oral QHS   sodium chloride  flush  3 mL Intravenous Q12H   Continuous Infusions:  sodium chloride  125 mL/hr at 03/27/24 1110   cefTRIAXone (ROCEPHIN)  IV Stopped (03/27/24 1015)   metronidazole 500 mg (03/27/24 1111)   PRN Meds:.acetaminophen  **OR** acetaminophen , guaiFENesin, hydrALAZINE , ipratropium-albuterol , metoprolol  tartrate, ondansetron  **OR** ondansetron  (ZOFRAN ) IV, senna-docusate  Allergies as of 03/26/2024 - Review Complete 03/26/2024  Allergen Reaction Noted   Crestor [rosuvastatin calcium ] Other (See Comments) 06/29/2017   Latex Hives and Rash 11/27/2009    Family History  Problem Relation Age of Onset   Stomach cancer Mother    Congestive Heart Failure Father    Dementia Father    Heart disease Brother     Social History   Socioeconomic History   Marital status: Married    Spouse name: Not on file   Number of children: 0   Years of education: 14   Highest education level: Not on file  Occupational  History   Occupation: Retired  Tobacco Use   Smoking status: Former    Current packs/day: 0.00    Average packs/day: 0.5 packs/day for 30.0 years (15.0 ttl pk-yrs)    Types: Cigarettes    Start date: 12/05/1982    Quit date: 12/04/2012    Years since quitting: 11.3   Smokeless tobacco: Never  Vaping Use   Vaping status: Never Used  Substance and Sexual Activity   Alcohol use: No    Alcohol/week: 0.0 standard drinks of alcohol   Drug use: No   Sexual activity: Not Currently  Other Topics Concern   Not on file  Social History Narrative   Lives at home with his wife, Russell Padilla.   Right-handed.   4-5 cans diet colas and occasional glasses of tea per day.   Retired 2007.   Social Drivers of Corporate investment banker Strain: Not on file  Food Insecurity: Not on file  Transportation Needs: Not on file  Physical Activity: Not on file  Stress: Not on file  Social Connections: Unknown (02/14/2022)   Received [facility]   Social Network    Social Network: Not on file  Intimate Partner Violence: Unknown (01/06/2022)   Received from [facility]   HITS    Physically Hurt: Not on file    Insult or Talk Down To: Not on file    Threaten Physical Harm: Not on file    Scream or Curse: Not on file    Review of Systems: All negative except as stated above in HPI.  Physical Exam: Vital signs: Vitals:   03/27/24 0930 03/27/24 1100  BP: (!) 110/57 113/62  Pulse: 61 72  Resp: 17 16  Temp:    SpO2: 91% 90%  T 98   General: Lethargic, well-nourished, no acute distress  Head: normocephalic, atraumatic Eyes: anicteric sclera ENT: oropharynx clear Neck: supple, nontender Lungs:  Clear throughout to auscultation.   No wheezes, crackles, or rhonchi. No acute distress. Heart:  Regular rate and rhythm; no murmurs, clicks, rubs,  or gallops. Abdomen: Lower quadrant tenderness with guarding, soft, nondistended, positive bowel sounds Rectal:  Deferred Ext: no edema  GI:  Lab  Results: Recent Labs    03/26/24 1248 03/27/24 0500  WBC 5.7 6.3  HGB 11.4* 10.7*  HCT 36.4* 34.0*  PLT 252 216   BMET Recent Labs  03/26/24 1248 03/27/24 0500  NA 134* 136  K 3.5 3.6  CL 94* 107  CO2 27 25  GLUCOSE 95 74  BUN 12 8  CREATININE 2.40* 1.60*  CALCIUM  8.3* 7.5*   LFT Recent Labs    03/27/24 0500  PROT 4.5*  ALBUMIN 1.9*  AST 19  ALT 16  ALKPHOS 37*  BILITOT 0.6   PT/INR Recent Labs    03/26/24 1248  LABPROT 14.3  INR 1.1     Studies/Results:  Impression/Plan: 68 year old man with chronic bloody diarrhea in the setting of left-sided ulcerative colitis seen on colonoscopy in January 2024.  Failed to respond to Humira and steroids.  Evaluation for infectious diarrhea has been negative this year.  Needs an updated colonoscopy to further evaluate the extent of his inflammatory bowel disease.  To my review of his records I do not see any evidence of Crohn's disease but that will be reevaluated during his colonoscopy.  Will give him MiraLAX prep for his colonoscopy.  Clear liquid diet today.  Based on endoscopy availability colonoscopy will occur on Friday, June 27.   LOS: 1 day   [name]  03/27/2024, 11:19 AM  Questions please call [phone]

## 2024-03-27 NOTE — ED Notes (Signed)
GI to bedside

## 2024-03-28 ENCOUNTER — Inpatient Hospital Stay (HOSPITAL_COMMUNITY): Admitting: Registered Nurse

## 2024-03-28 ENCOUNTER — Encounter (HOSPITAL_COMMUNITY): Payer: Self-pay | Admitting: Internal Medicine

## 2024-03-28 ENCOUNTER — Encounter (HOSPITAL_COMMUNITY)
Admission: EM | Disposition: A | Discharge status: Home / Self Care | Payer: Self-pay | Source: Home / Self Care | Attending: Internal Medicine

## 2024-03-28 DIAGNOSIS — I2510 Atherosclerotic heart disease of native coronary artery without angina pectoris: Secondary | ICD-10-CM

## 2024-03-28 DIAGNOSIS — I10 Essential (primary) hypertension: Secondary | ICD-10-CM

## 2024-03-28 DIAGNOSIS — N179 Acute kidney failure, unspecified: Secondary | ICD-10-CM

## 2024-03-28 DIAGNOSIS — Z87891 Personal history of nicotine dependence: Secondary | ICD-10-CM

## 2024-03-28 DIAGNOSIS — R197 Diarrhea, unspecified: Secondary | ICD-10-CM | POA: Diagnosis present

## 2024-03-28 DIAGNOSIS — K529 Noninfective gastroenteritis and colitis, unspecified: Secondary | ICD-10-CM | POA: Diagnosis not present

## 2024-03-28 HISTORY — PX: BIOPSY OF SKIN SUBCUTANEOUS TISSUE AND/OR MUCOUS MEMBRANE: SHX6741

## 2024-03-28 HISTORY — PX: COLONOSCOPY: SHX5424

## 2024-03-28 LAB — CBC
HCT: 32.4 % — ABNORMAL LOW (ref 39.0–52.0)
Hemoglobin: 10.3 g/dL — ABNORMAL LOW (ref 13.0–17.0)
MCH: 26.7 pg (ref 26.0–34.0)
MCHC: 31.8 g/dL (ref 30.0–36.0)
MCV: 83.9 fL (ref 80.0–100.0)
Platelets: 218 10*3/uL (ref 150–400)
RBC: 3.86 MIL/uL — ABNORMAL LOW (ref 4.22–5.81)
RDW: 16 % — ABNORMAL HIGH (ref 11.5–15.5)
WBC: 3.4 10*3/uL — ABNORMAL LOW (ref 4.0–10.5)
nRBC: 0 % (ref 0.0–0.2)

## 2024-03-28 LAB — BASIC METABOLIC PANEL WITH GFR
Anion gap: 10 (ref 5–15)
BUN: 5 mg/dL — ABNORMAL LOW (ref 8–23)
CO2: 20 mmol/L — ABNORMAL LOW (ref 22–32)
Calcium: 7.7 mg/dL — ABNORMAL LOW (ref 8.9–10.3)
Chloride: 105 mmol/L (ref 98–111)
Creatinine, Ser: 1.2 mg/dL (ref 0.61–1.24)
GFR, Estimated: >60 mL/min (ref >60)
Glucose, Bld: 71 mg/dL (ref 70–99)
Potassium: 3.5 mmol/L (ref 3.5–5.1)
Sodium: 135 mmol/L (ref 135–145)

## 2024-03-28 LAB — MAGNESIUM: Magnesium: 1.7 mg/dL (ref 1.7–2.4)

## 2024-03-28 SURGERY — COLONOSCOPY
Anesthesia: Monitor Anesthesia Care

## 2024-03-28 MED ORDER — LIDOCAINE VISCOUS HCL 2 % MT SOLN
15.0000 mL | Freq: Three times a day (TID) | OROMUCOSAL | Status: DC | PRN
  Administered 2024-03-28 – 2024-03-29 (×2): 15 mL via OROMUCOSAL
  Filled 2024-03-28 (×3): qty 15

## 2024-03-28 MED ORDER — METHYLPREDNISOLONE SODIUM SUCC 125 MG IJ SOLR
125.0000 mg | Freq: Once | INTRAMUSCULAR | Status: AC
  Administered 2024-03-28: 125 mg via INTRAVENOUS
  Filled 2024-03-28: qty 2

## 2024-03-28 MED ORDER — ALUM & MAG HYDROXIDE-SIMETH 200-200-20 MG/5ML PO SUSP
15.0000 mL | Freq: Three times a day (TID) | ORAL | Status: DC | PRN
  Administered 2024-03-28 – 2024-03-29 (×2): 15 mL via ORAL
  Filled 2024-03-28 (×3): qty 30

## 2024-03-28 MED ORDER — SUCRALFATE 1 GM/10ML PO SUSP
1.0000 g | Freq: Three times a day (TID) | ORAL | Status: DC | PRN
  Administered 2024-03-28 – 2024-03-29 (×2): 1 g via ORAL
  Filled 2024-03-28 (×3): qty 10

## 2024-03-28 MED ORDER — PROPOFOL 10 MG/ML IV BOLUS
INTRAVENOUS | Status: DC | PRN
  Administered 2024-03-28: 50 mg via INTRAVENOUS
  Administered 2024-03-28: 30 mg via INTRAVENOUS
  Administered 2024-03-28: 100 ug/kg/min via INTRAVENOUS
  Administered 2024-03-28: 50 mg via INTRAVENOUS

## 2024-03-28 MED ORDER — SUCRALFATE 1 GM/10ML PO SUSP: 1.0000 g | Freq: Three times a day (TID) | ORAL | Status: DC | PRN

## 2024-03-28 MED ORDER — LIDOCAINE 2% (20 MG/ML) 5 ML SYRINGE
INTRAMUSCULAR | Status: DC | PRN
  Administered 2024-03-28: 100 mg via INTRAVENOUS

## 2024-03-28 MED ORDER — METHYLPREDNISOLONE SODIUM SUCC 125 MG IJ SOLR
60.0000 mg | Freq: Two times a day (BID) | INTRAMUSCULAR | Status: AC
  Administered 2024-03-29 – 2024-04-01 (×8): 60 mg via INTRAVENOUS
  Filled 2024-03-28 (×8): qty 2

## 2024-03-28 MED ORDER — PROCHLORPERAZINE EDISYLATE 10 MG/2ML IJ SOLN
5.0000 mg | INTRAMUSCULAR | Status: AC
  Administered 2024-03-28: 5 mg via INTRAVENOUS
  Filled 2024-03-28: qty 2

## 2024-03-28 NOTE — Interval H&P Note (Signed)
 History and Physical Interval Note:  03/28/2024 10:52 AM  Russell Padilla Mania  has presented today for surgery, with the diagnosis of Diarrhea; Colitis.  The various methods of treatment have been discussed with the patient and family. After consideration of risks, benefits and other options for treatment, the patient has consented to  Procedure(s): COLONOSCOPY (N/A) as a surgical intervention.  The patient's history has been reviewed, patient examined, no change in status, stable for surgery.  I have reviewed the patient's chart and labs.  Questions were answered to the patient's satisfaction.     [name]

## 2024-03-28 NOTE — Transfer of Care (Signed)
 Immediate Anesthesia Transfer of Care Note  Patient: Russell Padilla  Procedure(s) Performed: COLONOSCOPY BIOPSY, SKIN, SUBCUTANEOUS TISSUE, OR MUCOUS MEMBRANE  Patient Location: PACU  Anesthesia Type:MAC  Level of Consciousness: drowsy and patient cooperative  Airway & Oxygen Therapy: Patient Spontanous Breathing  Post-op Assessment: Report given to RN, Post -op Vital signs reviewed and stable, and Patient moving all extremities X 4  Post vital signs: Reviewed and stable  Last Vitals:  Vitals Value Taken Time  BP 103/44 03/28/24 11:35  Temp    Pulse 62 03/28/24 11:35  Resp 18 03/28/24 11:35  SpO2 98 % 03/28/24 11:35    Last Pain:  Vitals:   03/28/24 1135  TempSrc: Temporal  PainSc: Asleep         Complications: There were no known notable events for this encounter.

## 2024-03-28 NOTE — Progress Notes (Signed)
 PROGRESS NOTE        PATIENT DETAILS Name: Russell Padilla Age: 68 y.o. Sex: male Date of Birth: December 17, 1955 Admit Date: 03/26/2024 Admitting Physician Deliliah Room, MD ERE:Dtjbwz, Alm, MD  Brief Summary: Patient is a 68 y.o.  male with history of ulcerative colitis on Humira/mesalamine who presented with worsening of diarrhea.  He was found to have hypotension and AKI.  He was subsequently admitted to the hospitalist service.  Significant events: 6/24>> admit to [facility]  Significant studies: 6/03>> L LE Doppler: No  DVT. 6/24>> CT abdomen/pelvis: Diffuse colitis.  7 mm stone in the urinary bladder. 6/24>> CT head: No acute intracranial malady 6/24>> CXR: No PNA  Significant microbiology data: 6/24>> blood culture: No growth  Procedures: None  Consults: GI  Subjective: Had extensive output with colonoscopy prep.  Right-sided mild abdominal pain persists.  Objective: Vitals: Blood pressure 125/70, pulse 65, temperature 97.6 F (36.4 C), temperature source Oral, resp. rate 19, height 5' 10 (1.778 m), weight 109.3 kg, SpO2 97%.   Exam: Gen Exam:Alert awake-not in any distress HEENT:atraumatic, normocephalic Chest: B/L clear to auscultation anteriorly CVS:S1S2 regular Abdomen: Soft-mild tender in the right lower abdominal area. Extremities:+ edema left leg. Neurology: Non focal Skin: no rash  Pertinent Labs/Radiology:    Latest Ref Rng & Units 03/28/2024    6:31 AM 03/27/2024    5:00 AM 03/26/2024   12:48 PM  CBC  WBC 4.0 - 10.5 K/uL 3.4  6.3  5.7   Hemoglobin 13.0 - 17.0 g/dL 89.6  89.2  88.5   Hematocrit 39.0 - 52.0 % 32.4  34.0  36.4   Platelets 150 - 400 K/uL 218  216  252     Lab Results  Component Value Date   NA 135 03/28/2024   K 3.5 03/28/2024   CL 105 03/28/2024   CO2 20 (L) 03/28/2024      Assessment/Plan: Worsening chronic diarrhea secondary to presumed UC flare Reviewed GI note-recent outpatient stool studies  negative for infection Colonoscopy scheduled later today Unfortunately-appears to have refractory UC-not responding to Humira/mesalamine. Await recommendations from GI.  Hypovolemic shock Secondary to worsening diarrhea BP now stable  AKI Hemodynamically mediated due to hypotension/diarrhea/ARB use AKI has resolved-creatinine has normalized.  CAD Recent LHC May 2025-no targets for PCI-medical management recommended. Currently without any anginal symptoms  HTN BP stable All antihypertensives on hold  HLD Repatha  at home  7 mm stone in the urinary bladder Supportive care Monitor-outpatient urology follow-up  Multiple nonobstructing bilateral renal calculi No hydronephrosis per CT report Outpatient urology follow-up  Left leg swelling Per patient-this has been attributed to varicose veins Recent outpatient Doppler negative for DVT Supportive care Compression stockings when able.  Class 1 Obesity Estimated body mass index is 34.58 kg/m as calculated from the following:   Height as of this encounter: 5' 10 (1.778 m).   Weight as of this encounter: 109.3 kg.   Code status:   Code Status: Full Code   DVT Prophylaxis: SCDs Start: 03/26/24 1820   Family Communication: None at bedside   Disposition Plan: Status is: Inpatient Remains inpatient appropriate because: Severity of illness   Planned Discharge Destination:Home   Diet: Diet Order             Diet NPO time specified  Diet effective midnight  Antimicrobial agents: Anti-infectives (From admission, onward)    Start     Dose/Rate Route Frequency Ordered Stop   03/27/24 1000  cefTRIAXone (ROCEPHIN) 2 g in sodium chloride  0.9 % 100 mL IVPB  Status:  Discontinued        2 g 200 mL/hr over 30 Minutes Intravenous Every 24 hours 03/26/24 1825 03/27/24 1317   03/26/24 2200  metroNIDAZOLE (FLAGYL) IVPB 500 mg  Status:  Discontinued        500 mg 100 mL/hr over 60 Minutes  Intravenous Every 12 hours 03/26/24 1825 03/27/24 1317   03/26/24 1530  cefTRIAXone (ROCEPHIN) 2 g in sodium chloride  0.9 % 100 mL IVPB        2 g 200 mL/hr over 30 Minutes Intravenous Once 03/26/24 1529 03/26/24 1715   03/26/24 1530  metroNIDAZOLE (FLAGYL) IVPB 500 mg        500 mg 100 mL/hr over 60 Minutes Intravenous  Once 03/26/24 1529 03/26/24 1848        MEDICATIONS: Scheduled Meds:  [MAR Hold] cholecalciferol  1,000 Units Oral Daily   [MAR Hold] dicyclomine   40 mg Oral q morning   And   [MAR Hold] dicyclomine   20 mg Oral QHS   [MAR Hold] fluticasone   1 spray Each Nare Daily   [MAR Hold] gabapentin   300 mg Oral q morning   And   [MAR Hold] gabapentin   600 mg Oral QHS   [MAR Hold] pantoprazole   40 mg Oral QAC breakfast   [MAR Hold] sodium chloride  flush  3 mL Intravenous Q12H   Continuous Infusions:  sodium chloride      sodium chloride  100 mL/hr at 03/28/24 1055   PRN Meds:.[MAR Hold] acetaminophen  **OR** [MAR Hold] acetaminophen , [MAR Hold] guaiFENesin, [MAR Hold] hydrALAZINE , [MAR Hold] ipratropium-albuterol , [MAR Hold] metoprolol  tartrate, [MAR Hold] ondansetron  **OR** [MAR Hold] ondansetron  (ZOFRAN ) IV, [MAR Hold] senna-docusate, [MAR Hold] sodium chloride    I have personally reviewed following labs and imaging studies  LABORATORY DATA: CBC: Recent Labs  Lab 03/26/24 1248 03/27/24 0500 03/28/24 0631  WBC 5.7 6.3 3.4*  NEUTROABS 3.7  --   --   HGB 11.4* 10.7* 10.3*  HCT 36.4* 34.0* 32.4*  MCV 84.8 85.0 83.9  PLT 252 216 218    Basic Metabolic Panel: Recent Labs  Lab 03/26/24 1248 03/27/24 0500 03/28/24 0631  NA 134* 136 135  K 3.5 3.6 3.5  CL 94* 107 105  CO2 27 25 20*  GLUCOSE 95 74 71  BUN 12 8 5*  CREATININE 2.40* 1.60* 1.20  CALCIUM  8.3* 7.5* 7.7*  MG  --   --  1.7    GFR: Estimated Creatinine Clearance: 71.9 mL/min (by C-G formula based on SCr of 1.2 mg/dL).  Liver Function Tests: Recent Labs  Lab 03/26/24 1248 03/27/24 0500  AST  19 19  ALT 17 16  ALKPHOS 41 37*  BILITOT 0.7 0.6  PROT 5.0* 4.5*  ALBUMIN 2.1* 1.9*   Recent Labs  Lab 03/26/24 1248  LIPASE 26   No results for input(s): AMMONIA in the last 168 hours.  Coagulation Profile: Recent Labs  Lab 03/26/24 1248  INR 1.1    Cardiac Enzymes: No results for input(s): CKTOTAL, CKMB, CKMBINDEX, TROPONINI in the last 168 hours.  BNP (last 3 results) No results for input(s): PROBNP in the last 8760 hours.  Lipid Profile: No results for input(s): CHOL, HDL, LDLCALC, TRIG, CHOLHDL, LDLDIRECT in the last 72 hours.  Thyroid Function Tests: No results for input(s): TSH, T4TOTAL,  FREET4, T3FREE, THYROIDAB in the last 72 hours.  Anemia Panel: No results for input(s): VITAMINB12, FOLATE, FERRITIN, TIBC, IRON, RETICCTPCT in the last 72 hours.  Urine analysis:    Component Value Date/Time   COLORURINE YELLOW 03/26/2024 1353   APPEARANCEUR HAZY (A) 03/26/2024 1353   LABSPEC 1.005 03/26/2024 1353   PHURINE 6.0 03/26/2024 1353   GLUCOSEU NEGATIVE 03/26/2024 1353   GLUCOSEU NEGATIVE 11/24/2009 1140   HGBUR NEGATIVE 03/26/2024 1353   BILIRUBINUR NEGATIVE 03/26/2024 1353   KETONESUR NEGATIVE 03/26/2024 1353   PROTEINUR NEGATIVE 03/26/2024 1353   UROBILINOGEN 0.2 05/28/2013 2350   NITRITE NEGATIVE 03/26/2024 1353   LEUKOCYTESUR NEGATIVE 03/26/2024 1353    Sepsis Labs: Lactic Acid, Venous    Component Value Date/Time   LATICACIDVEN 0.7 03/26/2024 1452    MICROBIOLOGY: Recent Results (from the past 240 hours)  Blood Culture (routine x 2)     Status: None (Preliminary result)   Collection Time: 03/26/24  1:53 PM   Specimen: BLOOD RIGHT ARM  Result Value Ref Range Status   Specimen Description BLOOD RIGHT ARM  Final   Special Requests   Final    BOTTLES DRAWN AEROBIC AND ANAEROBIC Blood Culture results may not be optimal due to an inadequate volume of blood received in culture bottles   Culture    Final    NO GROWTH 2 DAYS Performed at [facility], [address], [place], [place] [address]    Report Status PENDING  Incomplete  Blood Culture (routine x 2)     Status: None (Preliminary result)   Collection Time: 03/26/24  1:58 PM   Specimen: BLOOD RIGHT HAND  Result Value Ref Range Status   Specimen Description BLOOD RIGHT HAND  Final   Special Requests   Final    BOTTLES DRAWN AEROBIC ONLY Blood Culture results may not be optimal due to an inadequate volume of blood received in culture bottles   Culture   Final    NO GROWTH 2 DAYS Performed at [facility], [address] [place], [place] [address]    Report Status PENDING  Incomplete    RADIOLOGY STUDIES/RESULTS: CT ABDOMEN PELVIS WO CONTRAST Result Date: 03/26/2024 CLINICAL DATA:  Sepsis. EXAM: CT ABDOMEN AND PELVIS WITHOUT CONTRAST TECHNIQUE: Multidetector CT imaging of the abdomen and pelvis was performed following the standard protocol without IV contrast. RADIATION DOSE REDUCTION: This exam was performed according to the departmental dose-optimization program which includes automated exposure control, adjustment of the mA and/or kV according to patient size and/or use of iterative reconstruction technique. COMPARISON:  CT abdomen pelvis dated 08/24/2015. FINDINGS: Evaluation of this exam is limited in the absence of intravenous contrast. Lower chest: Minimal bibasilar subpleural atelectasis. A 7 mm para fissural lymph node versus nodule along the right major fissure. Coronary vascular calcification. No intra-abdominal free air or free fluid. Hepatobiliary: Fatty liver. Cholecystectomy and pneumobilia. No retained calcified stone noted in the central CBD. Pancreas: Unremarkable. No pancreatic ductal dilatation or surrounding inflammatory changes. Spleen: Normal in size without focal abnormality. Adrenals/Urinary Tract: The adrenal glands unremarkable. Multiple nonobstructing bilateral renal calculi measure up to 5 mm.  No hydronephrosis or obstructing stone. The visualized ureters appear unremarkable. The urinary bladder is minimally distended. There is a 7 mm stone in the urinary bladder. Stomach/Bowel: Diffuse thickened appearance of the colon suspicious of colitis. There is no bowel obstruction. The appendix is normal. Vascular/Lymphatic: Moderate aortoiliac atherosclerotic disease. The IVC is unremarkable. No portal venous gas. There is  no adenopathy. Reproductive: The prostate and seminal vesicles are grossly remarkable. Other: Small fat containing bilateral inguinal hernias. Partially visualized bilateral hydroceles. Musculoskeletal: Degenerative changes of the spine and lower lumbar fusion. No acute osseous pathology. IMPRESSION: 1. Diffuse thickened appearance of the colon suspicious of colitis. No bowel obstruction. Normal appendix. 2. Multiple nonobstructing bilateral renal calculi. No hydronephrosis or obstructing stone. 3. A 7 mm stone in the urinary bladder. 4. Fatty liver. 5.  Aortic Atherosclerosis (ICD10-I70.0). Electronically Signed   By: Vanetta Chou M.D.   On: 03/26/2024 16:09   CT Head Wo Contrast Result Date: 03/26/2024 CLINICAL DATA:  Headache. Increasing frequency or severity. Dizziness. Difficulty walking. EXAM: CT HEAD WITHOUT CONTRAST TECHNIQUE: Contiguous axial images were obtained from the base of the skull through the vertex without intravenous contrast. RADIATION DOSE REDUCTION: This exam was performed according to the departmental dose-optimization program which includes automated exposure control, adjustment of the mA and/or kV according to patient size and/or use of iterative reconstruction technique. COMPARISON:  None Available. FINDINGS: Brain: The brain shows a normal appearance without evidence of malformation, atrophy, old or acute small or large vessel infarction, mass lesion, hemorrhage, hydrocephalus or extra-axial collection. Vascular: No hyperdense vessel. No evidence of  atherosclerotic calcification. Skull: Normal.  No traumatic finding.  No focal bone lesion. Sinuses/Orbits: Sinuses are clear. Orbits appear normal. Mastoids are clear. Other: None significant IMPRESSION: Normal head CT. Electronically Signed   By: Oneil Officer M.D.   On: 03/26/2024 16:07   DG Chest Port 1 View Result Date: 03/26/2024 CLINICAL DATA:  Questionable sepsis - evaluate for abnormality EXAM: PORTABLE CHEST - 1 VIEW COMPARISON:  None available. FINDINGS: No focal airspace consolidation, pleural effusion, or pneumothorax. No cardiomegaly. No acute fracture or destructive lesion. Partially visualized cervical fusion hardware again noted. IMPRESSION: No acute cardiopulmonary abnormality. Electronically Signed   By: Rogelia Myers M.D.   On: 03/26/2024 14:25     LOS: 2 days   Donalda Applebaum, MD  [facility]    To contact the attending provider between 7A-7P or the covering provider during after hours 7P-7A, please log into the web site [email or web address] and access using universal  password for that web site. If you do not have the password, please call the hospital operator.  03/28/2024, 10:57 AM

## 2024-03-28 NOTE — Plan of Care (Signed)
  Problem: Coping: Goal: Level of anxiety will decrease Outcome: Progressing   Problem: Elimination: Goal: Will not experience complications related to bowel motility Outcome: Progressing   Problem: Activity: Goal: Risk for activity intolerance will decrease Outcome: Progressing   Problem: Pain Managment: Goal: General experience of comfort will improve and/or be controlled Outcome: Progressing   Problem: Safety: Goal: Ability to remain free from injury will improve Outcome: Progressing   Problem: Skin Integrity: Goal: Risk for impaired skin integrity will decrease Outcome: Progressing   Problem: Clinical Measurements: Goal: Ability to maintain clinical measurements within normal limits will improve Outcome: Progressing

## 2024-03-28 NOTE — Op Note (Signed)
 [facility] Patient Name: Russell Padilla Procedure Date : 03/28/2024 MRN: [id number] Attending MD: [name] , MD, [id number] Date of Birth: [birth year] CSN: [id number] Age: 68 Admit Type: Inpatient Procedure:                Colonoscopy Indications:              Last colonoscopy: January 2024 Providers:                Jerrell KYM Sol, MD, Gregoria Pierce, RN,                            Haskel Chris, Technician Referring MD:             hospital team Medicines:                Propofol per Anesthesia, Monitored Anesthesia Care Complications:            No immediate complications. Estimated Blood Loss:     Estimated blood loss was minimal. Procedure:                Pre-Anesthesia Assessment:                           - Prior to the procedure, a History and Physical                            was performed, and patient medications and                            allergies were reviewed. The patient's tolerance of                            previous anesthesia was also reviewed. The risks                            and benefits of the procedure and the sedation                            options and risks were discussed with the patient.                            All questions were answered, and informed consent                            was obtained. Prior Anticoagulants: The patient has                            taken no anticoagulant or antiplatelet agents. ASA                            Grade Assessment: III - A patient with severe                            systemic disease. After reviewing the risks and  benefits, the patient was deemed in satisfactory                            condition to undergo the procedure.                           After obtaining informed consent, the colonoscope                            was passed under direct vision. Throughout the                            procedure, the patient's blood pressure,  pulse, and                            oxygen saturations were monitored continuously. The                            PCF-190TL ([id number]) Olympus colonoscope was                            introduced through the anus and advanced to the the                            cecum, identified by appendiceal orifice and                            ileocecal valve. The colonoscopy was performed                            without difficulty. The patient tolerated the                            procedure well. The quality of the bowel                            preparation was adequate and good. The terminal                            ileum, ileocecal valve, appendiceal orifice, and                            rectum were photographed. Scope In: 11:04:01 AM Scope Out: 11:27:00 AM Scope Withdrawal Time: 0 hours 20 minutes 56 seconds  Total Procedure Duration: 0 hours 22 minutes 59 seconds  Findings:      The perianal and digital rectal examinations were normal.      A diffuse area of severely congested, erythematous, eroded, friable       (with contact bleeding), granular, inflamed, pseudopolypoid, ulcerated       and vascular-pattern-decreased mucosa was found in the entire colon.       Biopsies were taken with a cold forceps for histology. Estimated blood       loss was minimal.      The terminal ileum appeared normal. Biopsies were taken with a cold  forceps for histology. Estimated blood loss was minimal.      Internal hemorrhoids were found during endoscopy. The hemorrhoids were       medium-sized and Grade I (internal hemorrhoids that do not prolapse). Impression:               - Congested, erythematous, eroded, friable (with                            contact bleeding), granular, inflamed,                            pseudopolypoid, ulcerated and                            vascular-pattern-decreased mucosa in the entire                            examined colon. Biopsied.                            - The examined portion of the ileum was normal.                            Biopsied.                           - Internal hemorrhoids.                           - Severe diffuse colitis likely ulcerative                            pancolitis. Recommendation:           - Clear liquid diet.                           - IV steroids. Will need to change to a different                            biologic as an outpt.                           - Await pathology results. Procedure Code(s):        --- Professional ---                           [phone], Colonoscopy, flexible; with biopsy, single                            or multiple Diagnosis Code(s):        --- Professional ---                           K92.2, Gastrointestinal hemorrhage, unspecified                           K63.3, Ulcer of intestine  K52.9, Noninfective gastroenteritis and colitis,                            unspecified                           K63.89, Other specified diseases of intestine                           K64.0, First degree hemorrhoids CPT copyright 2022 [employer]. All rights reserved. The codes documented in this report are preliminary and upon coder review may  be revised to meet current compliance requirements. [name], MD 03/28/2024 11:36:00 AM This report has been signed electronically. Number of Addenda: 0

## 2024-03-28 NOTE — Anesthesia Preprocedure Evaluation (Signed)
 Anesthesia Evaluation  Patient identified by MRN, date of birth, ID band Patient awake    Reviewed: Allergy & Precautions, H&P , NPO status , Patient's Chart, lab work & pertinent test results  Airway Mallampati: II  TM Distance: >3 FB Neck ROM: Full    Dental no notable dental hx.    Pulmonary neg pulmonary ROS, former smoker   Pulmonary exam normal breath sounds clear to auscultation       Cardiovascular hypertension, Pt. on medications + CAD  Normal cardiovascular exam Rhythm:Regular Rate:Normal     Neuro/Psych  Headaches  negative psych ROS   GI/Hepatic Neg liver ROS,GERD  ,,  Endo/Other  negative endocrine ROS    Renal/GU negative Renal ROS  negative genitourinary   Musculoskeletal  (+) Arthritis , Osteoarthritis,    Abdominal  (+) + obese  Peds negative pediatric ROS (+)  Hematology  (+) Blood dyscrasia, anemia   Anesthesia Other Findings   Reproductive/Obstetrics negative OB ROS                             Anesthesia Physical Anesthesia Plan  ASA: 3  Anesthesia Plan: MAC   Post-op Pain Management: Minimal or no pain anticipated   Induction: Intravenous  PONV Risk Score and Plan: 1 and Propofol infusion and Treatment may vary due to age or medical condition  Airway Management Planned: Simple Face Mask  Additional Equipment:   Intra-op Plan:   Post-operative Plan:   Informed Consent: I have reviewed the patients History and Physical, chart, labs and discussed the procedure including the risks, benefits and alternatives for the proposed anesthesia with the patient or authorized representative who has indicated his/her understanding and acceptance.     Dental advisory given  Plan Discussed with: CRNA  Anesthesia Plan Comments:        Anesthesia Quick Evaluation

## 2024-03-29 ENCOUNTER — Other Ambulatory Visit (HOSPITAL_COMMUNITY): Payer: Self-pay

## 2024-03-29 DIAGNOSIS — K529 Noninfective gastroenteritis and colitis, unspecified: Secondary | ICD-10-CM | POA: Diagnosis not present

## 2024-03-29 DIAGNOSIS — I10 Essential (primary) hypertension: Secondary | ICD-10-CM | POA: Diagnosis not present

## 2024-03-29 DIAGNOSIS — N179 Acute kidney failure, unspecified: Secondary | ICD-10-CM | POA: Diagnosis not present

## 2024-03-29 LAB — SURGICAL PATHOLOGY

## 2024-03-29 LAB — BASIC METABOLIC PANEL WITH GFR
Anion gap: 8 (ref 5–15)
BUN: <5 mg/dL — ABNORMAL LOW (ref 8–23)
CO2: 24 mmol/L (ref 22–32)
Calcium: 8.3 mg/dL — ABNORMAL LOW (ref 8.9–10.3)
Chloride: 104 mmol/L (ref 98–111)
Creatinine, Ser: 1.29 mg/dL — ABNORMAL HIGH (ref 0.61–1.24)
GFR, Estimated: >60 mL/min (ref >60)
Glucose, Bld: 128 mg/dL — ABNORMAL HIGH (ref 70–99)
Potassium: 4.2 mmol/L (ref 3.5–5.1)
Sodium: 136 mmol/L (ref 135–145)

## 2024-03-29 LAB — CBC
HCT: 37 % — ABNORMAL LOW (ref 39.0–52.0)
Hemoglobin: 11.7 g/dL — ABNORMAL LOW (ref 13.0–17.0)
MCH: 27.3 pg (ref 26.0–34.0)
MCHC: 31.6 g/dL (ref 30.0–36.0)
MCV: 86.2 fL (ref 80.0–100.0)
Platelets: 278 10*3/uL (ref 150–400)
RBC: 4.29 MIL/uL (ref 4.22–5.81)
RDW: 16.2 % — ABNORMAL HIGH (ref 11.5–15.5)
WBC: 4.9 10*3/uL (ref 4.0–10.5)
nRBC: 0 % (ref 0.0–0.2)

## 2024-03-29 LAB — MAGNESIUM: Magnesium: 1.9 mg/dL (ref 1.7–2.4)

## 2024-03-29 MED ORDER — SODIUM CHLORIDE 0.9 % IV SOLN
5.0000 mg/kg | Freq: Once | INTRAVENOUS | Status: AC
  Administered 2024-03-31: 500 mg via INTRAVENOUS
  Filled 2024-03-29: qty 50

## 2024-03-29 MED ORDER — SODIUM CHLORIDE 0.9 % IV SOLN: INTRAVENOUS | Status: DC

## 2024-03-29 NOTE — Plan of Care (Signed)
  Problem: Health Behavior/Discharge Planning: Goal: Ability to manage health-related needs will improve Outcome: Progressing   Problem: Activity: Goal: Risk for activity intolerance will decrease Outcome: Progressing   Problem: Nutrition: Goal: Adequate nutrition will be maintained Outcome: Progressing   Problem: Coping: Goal: Level of anxiety will decrease Outcome: Progressing   Problem: Elimination: Goal: Will not experience complications related to bowel motility Outcome: Progressing   

## 2024-03-29 NOTE — Progress Notes (Signed)
 PROGRESS NOTE        PATIENT DETAILS Name: Russell Padilla Age: 68 y.o. Sex: male Date of Birth: [birth year] Admit Date: 03/26/2024 Admitting Physician Deliliah Room, MD ERE:[email or web address], Alm, MD  Brief Summary: Patient is a 68 y.o.  male with history of ulcerative colitis on Humira/mesalamine who presented with worsening of diarrhea.  He was found to have hypotension secondary to hypovolemic shock along with AKI.  He was evaluated by GI and underwent colonoscopy which showed pancolitis.    Significant events: 6/24>> admit to [facility]-worsening diarrhea for the past several months 6/26>> colonoscopy with severe pan colitis-IV steroids started 6/27>> continues to have diarrhea-discussed with GI/pharmacy-infliximab infusion planned for this coming Sunday.  Significant studies: 6/03>> L LE Doppler: No  DVT. 6/24>> CT abdomen/pelvis: Diffuse colitis.  7 mm stone in the urinary bladder. 6/24>> CT head: No acute intracranial malady 6/24>> CXR: No PNA  Significant microbiology data: 6/24>> blood culture: No growth  Procedures: 6/26>> colonoscopy: Severe pancolitis-likely ulcerative colitis  Consults: GI  Subjective: 5-6 loose stools overnight.  Some mild lower abdominal pain.  No other complaints.  Objective: Vitals: Blood pressure 137/76, pulse 64, temperature 98.4 F (36.9 C), temperature source Oral, resp. rate 18, height 5' 10 (1.778 m), weight 109.3 kg, SpO2 98%.   Exam: Awake/alert Chest: Clear to auscultation Abdomen: Soft nontender nondistended Extremities: No edema Nonfocal exam  Pertinent Labs/Radiology:    Latest Ref Rng & Units 03/29/2024    7:35 AM 03/28/2024    6:31 AM 03/27/2024    5:00 AM  CBC  WBC 4.0 - 10.5 K/uL 4.9  3.4  6.3   Hemoglobin 13.0 - 17.0 g/dL 88.2  89.6  89.2   Hematocrit 39.0 - 52.0 % 37.0  32.4  34.0   Platelets 150 - 400 K/uL 278  218  216     Lab Results  Component Value Date   NA 136 03/29/2024   K 4.2 03/29/2024    CL 104 03/29/2024   CO2 24 03/29/2024      Assessment/Plan: Worsening diarrhea secondary to severe ulcerative colitis (pancolitis on colonoscopy on 6/26)  Unfortunately-appears to have severe colitis in spite of being on mesalamine and Humira.  Describes having voluminous diarrhea since January 2025. Tolerating clear liquids Continues to have diarrhea only minimally improved after starting IV steroids on 6/26 Discussed with GI and then with pharmacy-plans are to start infliximab 6/29. Per GI-recent outpatient stool studies negative for C. difficile.  Hypovolemic shock Secondary to worsening diarrhea  BP now stable after IV fluid resuscitation.  AKI Hemodynamically mediated due to hypotension/diarrhea/ARB use Creatinine mildly elevated today-continues to have diarrhea-will gently hydrate with IVF for a few hours today. Repeat electrolytes tomorrow.  CAD Recent LHC May 2025-no targets for PCI-medical management recommended. Currently without any anginal symptoms  HTN BP stable All antihypertensives on hold  HLD Repatha  at home  7 mm stone in the urinary bladder Supportive care Monitor-outpatient urology follow-up  Multiple nonobstructing bilateral renal calculi No hydronephrosis per CT report Outpatient urology follow-up  Left leg swelling Per patient-this has been attributed to varicose veins Recent outpatient Doppler negative for DVT Supportive care Compression stockings when able.  Class 1 Obesity Estimated body mass index is 34.58 kg/m as calculated from the following:   Height as of this encounter: 5' 10 (1.778 m).   Weight as of this  encounter: 109.3 kg.   Code status:   Code Status: Full Code   DVT Prophylaxis: Place TED hose Start: 03/28/24 1112 SCDs Start: 03/26/24 1820   Family Communication: None at bedside   Disposition Plan: Status is: Inpatient Remains inpatient appropriate because: Severity of illness   Planned Discharge  Destination:Home   Diet: Diet Order             Diet full liquid Fluid consistency: Thin  Diet effective 1400           Diet clear liquid Fluid consistency: Thin  Diet effective now                     Antimicrobial agents: Anti-infectives (From admission, onward)    Start     Dose/Rate Route Frequency Ordered Stop   03/27/24 1000  cefTRIAXone  (ROCEPHIN ) 2 g in sodium chloride  0.9 % 100 mL IVPB  Status:  Discontinued        2 g 200 mL/hr over 30 Minutes Intravenous Every 24 hours 03/26/24 1825 03/27/24 1317   03/26/24 2200  metroNIDAZOLE  (FLAGYL ) IVPB 500 mg  Status:  Discontinued        500 mg 100 mL/hr over 60 Minutes Intravenous Every 12 hours 03/26/24 1825 03/27/24 1317   03/26/24 1530  cefTRIAXone  (ROCEPHIN ) 2 g in sodium chloride  0.9 % 100 mL IVPB        2 g 200 mL/hr over 30 Minutes Intravenous Once 03/26/24 1529 03/26/24 1715   03/26/24 1530  metroNIDAZOLE  (FLAGYL ) IVPB 500 mg        500 mg 100 mL/hr over 60 Minutes Intravenous  Once 03/26/24 1529 03/26/24 1848        MEDICATIONS: Scheduled Meds:  cholecalciferol   1,000 Units Oral Daily   dicyclomine   40 mg Oral q morning   And   dicyclomine   20 mg Oral QHS   fluticasone   1 spray Each Nare Daily   gabapentin   300 mg Oral q morning   And   gabapentin   600 mg Oral QHS   methylPREDNISolone  (SOLU-MEDROL ) injection  60 mg Intravenous Q12H   pantoprazole   40 mg Oral QAC breakfast   sodium chloride  flush  3 mL Intravenous Q12H   Continuous Infusions:  [START ON 03/31/2024] inFLIXimab-dyyb (INFLECTRA) 5 mg/kg = 500 mg in sodium chloride  0.9 % 250 mL infusion     PRN Meds:.acetaminophen  **OR** acetaminophen , sucralfate  **AND** lidocaine  **AND** alum & mag hydroxide-simeth, guaiFENesin , hydrALAZINE , ipratropium-albuterol , metoprolol  tartrate, ondansetron  **OR** ondansetron  (ZOFRAN ) IV, senna-docusate, sodium chloride    I have personally reviewed following labs and imaging studies  LABORATORY  DATA: CBC: Recent Labs  Lab 03/26/24 1248 03/27/24 0500 03/28/24 0631 03/29/24 0735  WBC 5.7 6.3 3.4* 4.9  NEUTROABS 3.7  --   --   --   HGB 11.4* 10.7* 10.3* 11.7*  HCT 36.4* 34.0* 32.4* 37.0*  MCV 84.8 85.0 83.9 86.2  PLT 252 216 218 278    Basic Metabolic Panel: Recent Labs  Lab 03/26/24 1248 03/27/24 0500 03/28/24 0631 03/29/24 0735  NA 134* 136 135 136  K 3.5 3.6 3.5 4.2  CL 94* 107 105 104  CO2 27 25 20* 24  GLUCOSE 95 74 71 128*  BUN 12 8 5* <5*  CREATININE 2.40* 1.60* 1.20 1.29*  CALCIUM  8.3* 7.5* 7.7* 8.3*  MG  --   --  1.7 1.9    GFR: Estimated Creatinine Clearance: 66.9 mL/min (A) (by C-G formula based on SCr of 1.29 mg/dL (H)).  Liver Function Tests: Recent Labs  Lab 03/26/24 1248 03/27/24 0500  AST 19 19  ALT 17 16  ALKPHOS 41 37*  BILITOT 0.7 0.6  PROT 5.0* 4.5*  ALBUMIN 2.1* 1.9*   Recent Labs  Lab 03/26/24 1248  LIPASE 26   No results for input(s): AMMONIA in the last 168 hours.  Coagulation Profile: Recent Labs  Lab 03/26/24 1248  INR 1.1    Cardiac Enzymes: No results for input(s): CKTOTAL, CKMB, CKMBINDEX, TROPONINI in the last 168 hours.  BNP (last 3 results) No results for input(s): PROBNP in the last 8760 hours.  Lipid Profile: No results for input(s): CHOL, HDL, LDLCALC, TRIG, CHOLHDL, LDLDIRECT in the last 72 hours.  Thyroid Function Tests: No results for input(s): TSH, T4TOTAL, FREET4, T3FREE, THYROIDAB in the last 72 hours.  Anemia Panel: No results for input(s): VITAMINB12, FOLATE, FERRITIN, TIBC, IRON, RETICCTPCT in the last 72 hours.  Urine analysis:    Component Value Date/Time   COLORURINE YELLOW 03/26/2024 1353   APPEARANCEUR HAZY (A) 03/26/2024 1353   LABSPEC 1.005 03/26/2024 1353   PHURINE 6.0 03/26/2024 1353   GLUCOSEU NEGATIVE 03/26/2024 1353   GLUCOSEU NEGATIVE 11/24/2009 1140   HGBUR NEGATIVE 03/26/2024 1353   BILIRUBINUR NEGATIVE 03/26/2024 1353    KETONESUR NEGATIVE 03/26/2024 1353   PROTEINUR NEGATIVE 03/26/2024 1353   UROBILINOGEN 0.2 05/28/2013 2350   NITRITE NEGATIVE 03/26/2024 1353   LEUKOCYTESUR NEGATIVE 03/26/2024 1353    Sepsis Labs: Lactic Acid, Venous    Component Value Date/Time   LATICACIDVEN 0.7 03/26/2024 1452    MICROBIOLOGY: Recent Results (from the past 240 hours)  Blood Culture (routine x 2)     Status: None (Preliminary result)   Collection Time: 03/26/24  1:53 PM   Specimen: BLOOD RIGHT ARM  Result Value Ref Range Status   Specimen Description BLOOD RIGHT ARM  Final   Special Requests   Final    BOTTLES DRAWN AEROBIC AND ANAEROBIC Blood Culture results may not be optimal due to an inadequate volume of blood received in culture bottles   Culture   Final    NO GROWTH 3 DAYS Performed at [facility], [address], [place], [place] [address]    Report Status PENDING  Incomplete  Blood Culture (routine x 2)     Status: None (Preliminary result)   Collection Time: 03/26/24  1:58 PM   Specimen: BLOOD RIGHT HAND  Result Value Ref Range Status   Specimen Description BLOOD RIGHT HAND  Final   Special Requests   Final    BOTTLES DRAWN AEROBIC ONLY Blood Culture results may not be optimal due to an inadequate volume of blood received in culture bottles   Culture   Final    NO GROWTH 3 DAYS Performed at [facility], [address], [place], [place] [address]    Report Status PENDING  Incomplete    RADIOLOGY STUDIES/RESULTS: No results found.    LOS: 3 days   Donalda Applebaum, MD  [facility]    To contact the attending provider between 7A-7P or the covering provider during after hours 7P-7A, please log into the web site [email or web address] and access using universal [place] password for that web site. If you do not have the password, please call the hospital operator.  03/29/2024, 11:12 AM

## 2024-03-29 NOTE — Anesthesia Postprocedure Evaluation (Signed)
 Anesthesia Post Note  Patient: Russell Padilla  Procedure(s) Performed: COLONOSCOPY BIOPSY, SKIN, SUBCUTANEOUS TISSUE, OR MUCOUS MEMBRANE     Patient location during evaluation: PACU Anesthesia Type: MAC Level of consciousness: awake and alert Pain management: pain level controlled Vital Signs Assessment: post-procedure vital signs reviewed and stable Respiratory status: spontaneous breathing, nonlabored ventilation and respiratory function stable Cardiovascular status: blood pressure returned to baseline and stable Postop Assessment: no apparent nausea or vomiting Anesthetic complications: no   There were no known notable events for this encounter.  Last Vitals:  Vitals:   03/29/24 0835 03/29/24 0836  BP:    Pulse: 66 64  Resp:    Temp:    SpO2: 97% 98%    Last Pain:  Vitals:   03/29/24 0338  TempSrc: Oral  PainSc:                  [name]

## 2024-03-29 NOTE — Progress Notes (Signed)
 [facility] Progress Note  Russell Padilla 68 y.o. Jun 11, 1956   Subjective: Decreased stool frequency of 3-4 watery stools per day overnight. Abdominal pain. Minimal PO intake.  Objective: Vital signs: Vitals:   03/29/24 0835 03/29/24 0836  BP:    Pulse: 66 64  Resp:    Temp:    SpO2: 97% 98%  T 98.4, BP 121/71  Physical Exam: Gen: lethargic, well-nourished, no acute distress  HEENT: anicteric sclera CV: RRR Chest: CTA B Abd: diffuse tenderness (greatest in LLQ) with guarding, soft, nondistended, +BS Ext: no edema  Lab Results: Recent Labs    03/28/24 0631 03/29/24 0735  NA 135 136  K 3.5 4.2  CL 105 104  CO2 20* 24  GLUCOSE 71 128*  BUN 5* <5*  CREATININE 1.20 1.29*  CALCIUM  7.7* 8.3*  MG 1.7 1.9   Recent Labs    03/26/24 1248 03/27/24 0500  AST 19 19  ALT 17 16  ALKPHOS 41 37*  BILITOT 0.7 0.6  PROT 5.0* 4.5*  ALBUMIN 2.1* 1.9*   Recent Labs    03/26/24 1248 03/27/24 0500 03/28/24 0631 03/29/24 0735  WBC 5.7   < > 3.4* 4.9  NEUTROABS 3.7  --   --   --   HGB 11.4*   < > 10.3* 11.7*  HCT 36.4*   < > 32.4* 37.0*  MCV 84.8   < > 83.9 86.2  PLT 252   < > 218 278   < > = values in this interval not displayed.      Assessment/Plan: Severe ulcerative pancolitis - biopsies pending. IV steroids started yesterday.  C. difficile negative shortly before admission.  Will need Infliximab induction (5 mg/kg) prior to discharge. Despite decreased stool frequency he has severe inflammation that I and needs initiation of a biologic prior to discharge. Pharmacy aware and will coordinate. Change to full liquid diet this afternoon.  If he fails to improve on infliximab induction then we will need to transition to a biologic such as Stelara as an outpatient.  Supportive care. Dr. Kriss will see this weekend.  Defer to Dr. Kriss in terms of tapering of IV steroid dose and transitioning to oral steroids prior to discharge which hopefully will be next  week if he responds to his first induction dose of the infliximab.   Russell Padilla 03/29/2024, 10:10 AM  Questions please call [phone] ID: Russell Padilla, male   DOB: 1955-10-09, 68 y.o.   MRN: [id number]

## 2024-03-30 ENCOUNTER — Encounter (HOSPITAL_COMMUNITY): Payer: Self-pay | Admitting: Internal Medicine

## 2024-03-30 DIAGNOSIS — K529 Noninfective gastroenteritis and colitis, unspecified: Secondary | ICD-10-CM | POA: Diagnosis not present

## 2024-03-30 DIAGNOSIS — N179 Acute kidney failure, unspecified: Secondary | ICD-10-CM | POA: Diagnosis not present

## 2024-03-30 DIAGNOSIS — I10 Essential (primary) hypertension: Secondary | ICD-10-CM | POA: Diagnosis not present

## 2024-03-30 LAB — BASIC METABOLIC PANEL WITH GFR
Anion gap: 8 (ref 5–15)
BUN: <5 mg/dL — ABNORMAL LOW (ref 8–23)
CO2: 26 mmol/L (ref 22–32)
Calcium: 8.5 mg/dL — ABNORMAL LOW (ref 8.9–10.3)
Chloride: 107 mmol/L (ref 98–111)
Creatinine, Ser: 1.04 mg/dL (ref 0.61–1.24)
GFR, Estimated: >60 mL/min (ref >60)
Glucose, Bld: 150 mg/dL — ABNORMAL HIGH (ref 70–99)
Potassium: 4.2 mmol/L (ref 3.5–5.1)
Sodium: 141 mmol/L (ref 135–145)

## 2024-03-30 LAB — CBC
HCT: 37.5 % — ABNORMAL LOW (ref 39.0–52.0)
Hemoglobin: 11.6 g/dL — ABNORMAL LOW (ref 13.0–17.0)
MCH: 26.8 pg (ref 26.0–34.0)
MCHC: 30.9 g/dL (ref 30.0–36.0)
MCV: 86.6 fL (ref 80.0–100.0)
Platelets: 272 10*3/uL (ref 150–400)
RBC: 4.33 MIL/uL (ref 4.22–5.81)
RDW: 16.5 % — ABNORMAL HIGH (ref 11.5–15.5)
WBC: 7.2 10*3/uL (ref 4.0–10.5)
nRBC: 0 % (ref 0.0–0.2)

## 2024-03-30 LAB — MAGNESIUM: Magnesium: 2 mg/dL (ref 1.7–2.4)

## 2024-03-30 MED ORDER — ENOXAPARIN SODIUM 60 MG/0.6ML IJ SOSY
50.0000 mg | PREFILLED_SYRINGE | INTRAMUSCULAR | Status: DC
  Administered 2024-03-30 – 2024-04-02 (×4): 50 mg via SUBCUTANEOUS
  Filled 2024-03-30 (×4): qty 0.6

## 2024-03-30 MED ORDER — NAPHAZOLINE-GLYCERIN 0.012-0.25 % OP SOLN
1.0000 [drp] | Freq: Four times a day (QID) | OPHTHALMIC | Status: DC | PRN
  Filled 2024-03-30: qty 15

## 2024-03-30 MED ORDER — ENOXAPARIN SODIUM 40 MG/0.4ML IJ SOSY: 40.0000 mg | PREFILLED_SYRINGE | INTRAMUSCULAR | Status: DC

## 2024-03-30 NOTE — Plan of Care (Signed)

## 2024-03-30 NOTE — Progress Notes (Signed)
 [facility] Progress Note  SUBJECTIVE:   Interval history: Russell Padilla was seen and evaluated today at bedside. Known to me from outpatient GI clinic. Doing well and in better spirits now that he is on IV steroids. Noted that he had small formed bowel movement today. He is tolerating diet. He is agreeable to starting Remicade infusion tomorrow. No chest pain, shortness of breath, abdominal pain, nausea, vomiting.   Past Medical History:  Diagnosis Date   Abdominal pain 07/11/2013   ongoing and unspecified   Arthritis    Bladder stone 07/11/2013   surgery planned   CAD (coronary artery disease)    a. 12/2012 Cath/PCI: DES to superior branch of OM2;  b. Repeat cath 4/14 with 20% LAD, patent stent OM3, 70% diffuse stenosis small, non-dominant RCA.   Crohn's disease (HCC)    Essential hypertension    Headache    HLD (hyperlipidemia)    Renal disorder    renal calculi   Stomach pain    chronic   Past Surgical History:  Procedure Laterality Date   BACK SURGERY     2 cervical/ 2 lumbar fusions   CARDIAC CATHETERIZATION N/A 03/04/2015   Procedure: Left Heart Cath and Coronary Angiography;  Surgeon: Lonni JONETTA Cash, MD;  Location: [facility] INVASIVE CV LAB;  Service: Cardiovascular;  Laterality: N/A;   CHOLECYSTECTOMY     laparoscopic.   CORONARY STENT PLACEMENT     3'14   ESOPHAGOGASTRODUODENOSCOPY ENDOSCOPY     multiple times-[facility]/ and now being followed by Endo[facility]   KNEE ARTHROSCOPY Left    '80's   LEFT HEART CATH AND CORONARY ANGIOGRAPHY N/A 02/16/2024   Procedure: LEFT HEART CATH AND CORONARY ANGIOGRAPHY;  Surgeon: Cash Lonni JONETTA, MD;  Location: [facility] INVASIVE CV LAB;  Service: Cardiovascular;  Laterality: N/A;   LEFT HEART CATHETERIZATION WITH CORONARY ANGIOGRAM N/A 12/05/2012   Procedure: LEFT HEART CATHETERIZATION WITH CORONARY ANGIOGRAM;  Surgeon: Salena GORMAN Negri, MD;  Location: [facility] CATH LAB;  Service: Cardiovascular;  Laterality: N/A;   LEFT HEART  CATHETERIZATION WITH CORONARY ANGIOGRAM N/A 01/17/2013   Procedure: LEFT HEART CATHETERIZATION WITH CORONARY ANGIOGRAM;  Surgeon: Deatrice DELENA Cage, MD;  Location: [facility] CATH LAB;  Service: Cardiovascular;  Laterality: N/A;   PERCUTANEOUS CORONARY STENT INTERVENTION (PCI-S) N/A 12/06/2012   Procedure: PERCUTANEOUS CORONARY STENT INTERVENTION (PCI-S);  Surgeon: Lonni JONETTA Cash, MD;  Location: [facility] CATH LAB;  Service: Cardiovascular;  Laterality: N/A;   Current Facility-Administered Medications  Medication Dose Route Frequency Provider Last Rate Last Admin   acetaminophen  (TYLENOL ) tablet 650 mg  650 mg Oral Q6H PRN Dianna Specking, MD   650 mg at 03/27/24 1107   Or   acetaminophen  (TYLENOL ) suppository 650 mg  650 mg Rectal Q6H PRN Dianna Specking, MD       sucralfate  (CARAFATE ) 1 GM/10ML suspension 1 g  1 g Oral TID PRN Raenelle Donalda HERO, MD   1 g at 03/29/24 2122   And   lidocaine  (XYLOCAINE ) 2 % viscous mouth solution 15 mL  15 mL Mouth/Throat TID PRN Raenelle Donalda HERO, MD   15 mL at 03/29/24 2122   And   alum & mag hydroxide-simeth (MAALOX/MYLANTA) 200-200-20 MG/5ML suspension 15 mL  15 mL Oral TID PRN Raenelle Donalda HERO, MD   15 mL at 03/29/24 2122   cholecalciferol  (VITAMIN D3) 25 MCG (1000 UNIT) tablet 1,000 Units  1,000 Units Oral Daily Dianna Specking, MD   1,000 Units at 03/30/24 0919   dicyclomine  (BENTYL ) tablet  40 mg  40 mg Oral q morning Dianna Specking, MD   40 mg at 03/30/24 9250   And   dicyclomine  (BENTYL ) tablet 20 mg  20 mg Oral QHS Dianna Specking, MD   20 mg at 03/30/24 1606   enoxaparin (LOVENOX) injection 50 mg  50 mg Subcutaneous Q24H Ghimire, Donalda HERO, MD       fluticasone  (FLONASE ) 50 MCG/ACT nasal spray 1 spray  1 spray Each Nare Daily Dianna Specking, MD   1 spray at 03/30/24 9081   gabapentin  (NEURONTIN ) capsule 300 mg  300 mg Oral q morning Dianna Specking, MD   300 mg at 03/30/24 9080   And   gabapentin  (NEURONTIN ) capsule 600 mg  600 mg Oral QHS  Dianna Specking, MD   600 mg at 03/29/24 2121   guaiFENesin  (ROBITUSSIN) 100 MG/5ML liquid 5 mL  5 mL Oral Q4H PRN Dianna Specking, MD       hydrALAZINE  (APRESOLINE ) injection 10 mg  10 mg Intravenous Q4H PRN Dianna Specking, MD       NOREEN ON 03/31/2024] inFLIXimab-dyyb (INFLECTRA) 5 mg/kg = 500 mg in sodium chloride  0.9 % 250 mL infusion  5 mg/kg Intravenous Once Dianna Specking, MD       ipratropium-albuterol  (DUONEB) 0.5-2.5 (3) MG/3ML nebulizer solution 3 mL  3 mL Nebulization Q4H PRN Dianna Specking, MD       methylPREDNISolone  sodium succinate (SOLU-MEDROL ) 125 mg/2 mL injection 60 mg  60 mg Intravenous Q12H Dianna Specking, MD   60 mg at 03/30/24 0919   metoprolol  tartrate (LOPRESSOR ) injection 5 mg  5 mg Intravenous Q4H PRN Dianna Specking, MD       naphazoline-glycerin (CLEAR EYES REDNESS) ophth solution 1-2 drop  1-2 drop Both Eyes QID PRN Ghimire, Donalda HERO, MD       ondansetron  (ZOFRAN ) tablet 4 mg  4 mg Oral Q6H PRN Dianna Specking, MD       Or   ondansetron  (ZOFRAN ) injection 4 mg  4 mg Intravenous Q6H PRN Dianna Specking, MD   4 mg at 03/28/24 0114   pantoprazole  (PROTONIX ) EC tablet 40 mg  40 mg Oral QAC breakfast Dianna Specking, MD   40 mg at 03/30/24 0749   senna-docusate (Senokot-S) tablet 1 tablet  1 tablet Oral QHS PRN Dianna Specking, MD       sodium chloride  (OCEAN) 0.65 % nasal spray 1 spray  1 spray Each Nare PRN Dianna Specking, MD       sodium chloride  flush (NS) 0.9 % injection 3 mL  3 mL Intravenous Q12H Dianna Specking, MD   3 mL at 03/30/24 0920   Allergies as of 03/26/2024 - Review Complete 03/26/2024  Allergen Reaction Noted   Crestor [rosuvastatin calcium ] Other (See Comments) 06/29/2017   Latex Hives and Rash 11/27/2009   Review of Systems:  Review of Systems  Respiratory:  Negative for shortness of breath.   Cardiovascular:  Negative for chest pain.  Gastrointestinal:  Negative for abdominal pain, nausea and vomiting.     OBJECTIVE:   Temp:  [97.7 F (36.5 C)-98.1 F (36.7 C)] 98.1 F (36.7 C) (06/28 1603) Pulse Rate:  [62-70] 68 (06/28 0855) Resp:  [17-18] 18 (06/28 1603) BP: (107-134)/(58-83) 134/83 (06/28 1603) SpO2:  [95 %-99 %] 99 % (06/28 0855) Last BM Date : 03/29/24 Physical Exam Constitutional:      General: He is not in acute distress.    Appearance: He is not ill-appearing, toxic-appearing or diaphoretic.   Cardiovascular:  Rate and Rhythm: Normal rate and regular rhythm.  Pulmonary:     Effort: No respiratory distress.     Breath sounds: Normal breath sounds.  Abdominal:     General: There is no distension.     Palpations: Abdomen is soft.     Tenderness: There is no abdominal tenderness. There is no guarding.   Neurological:     Mental Status: He is alert.     Labs: Recent Labs    03/28/24 0631 03/29/24 0735 03/30/24 0742  WBC 3.4* 4.9 7.2  HGB 10.3* 11.7* 11.6*  HCT 32.4* 37.0* 37.5*  PLT 218 278 272   BMET Recent Labs    03/28/24 0631 03/29/24 0735 03/30/24 0742  NA 135 136 141  K 3.5 4.2 4.2  CL 105 104 107  CO2 20* 24 26  GLUCOSE 71 128* 150*  BUN 5* <5* <5*  CREATININE 1.20 1.29* 1.04  CALCIUM  7.7* 8.3* 8.5*   LFT No results for input(s): PROT, ALBUMIN, AST, ALT, ALKPHOS, BILITOT, BILIDIR, IBILI in the last 72 hours. PT/INR No results for input(s): LABPROT, INR in the last 72 hours. Diagnostic imaging: No results found.  IMPRESSION: Pan-colonic ulcerative colitis  -Incomplete response to 5-ASA  -Possible primary non-response to adalimumab in outpatient setting  -Pending initiation of infliximab 03/31/24 Coronary artery disease Hypertension Hyperlipidemia   PLAN: -Ok for low fiber/low residue diet from GI perspective -Maintenance IV fluids -IFX infusion tomorrow, will coordinate further infusions through my office in outpatient setting -[facility] will follow   LOS: 4 days   Estefana Keas, [facility]  Gastroenterology

## 2024-03-30 NOTE — Plan of Care (Signed)
  Problem: Clinical Measurements: Goal: Ability to maintain clinical measurements within normal limits will improve Outcome: Progressing   Problem: Activity: Goal: Risk for activity intolerance will decrease Outcome: Progressing   Problem: Nutrition: Goal: Adequate nutrition will be maintained Outcome: Progressing   Problem: Coping: Goal: Level of anxiety will decrease Outcome: Progressing   Problem: Pain Managment: Goal: General experience of comfort will improve and/or be controlled Outcome: Progressing

## 2024-03-30 NOTE — Progress Notes (Signed)
 PROGRESS NOTE        PATIENT DETAILS Name: Russell Padilla Age: 68 y.o. Sex: male Date of Birth: Dec 12, 1955 Admit Date: 03/26/2024 Admitting Physician Deliliah Room, MD ERE:Dtjbwz, Alm, MD  Brief Summary: Patient is a 68 y.o.  male with history of ulcerative colitis on Humira/mesalamine who presented with worsening of diarrhea.  He was found to have hypotension secondary to hypovolemic shock along with AKI.  He was evaluated by GI and underwent colonoscopy which showed pancolitis.    Significant events: 6/24>> admit to [facility]-worsening diarrhea for the past several months 6/26>> colonoscopy with severe pan colitis-IV steroids started 6/27>> continues to have diarrhea-discussed with GI/pharmacy-infliximab infusion planned for this coming Sunday.  Significant studies: 6/03>> L LE Doppler: No  DVT. 6/24>> CT abdomen/pelvis: Diffuse colitis.  7 mm stone in the urinary bladder. 6/24>> CT head: No acute intracranial malady 6/24>> CXR: No PNA  Significant microbiology data: 6/24>> blood culture: No growth  Procedures: 6/26>> colonoscopy: Severe pancolitis-likely ulcerative colitis-IV Solu-Medrol  started.  Consults: GI  Subjective: Remarkable improvement overnight-only 1 BM for the past 24 hours-stools now getting formed.    Objective: Vitals: Blood pressure 126/63, pulse 68, temperature 98 F (36.7 C), temperature source Oral, resp. rate 18, height 5' 10 (1.778 m), weight 109.3 kg, SpO2 99%.   Exam: Awake/alert Abdomen: Soft nontender nondistended Extremities: No edema Nonfocal exam.  Pertinent Labs/Radiology:    Latest Ref Rng & Units 03/30/2024    7:42 AM 03/29/2024    7:35 AM 03/28/2024    6:31 AM  CBC  WBC 4.0 - 10.5 K/uL 7.2  4.9  3.4   Hemoglobin 13.0 - 17.0 g/dL 88.3  88.2  89.6   Hematocrit 39.0 - 52.0 % 37.5  37.0  32.4   Platelets 150 - 400 K/uL 272  278  218     Lab Results  Component Value Date   NA 141 03/30/2024   K 4.2  03/30/2024   CL 107 03/30/2024   CO2 26 03/30/2024      Assessment/Plan: Worsening diarrhea secondary to severe ulcerative colitis (pancolitis on colonoscopy on 6/26)  Refractory/severe colitis in spite of being on mesalamine and Humira-has had voluminous diarrhea since 2025-underwent colonoscopy which showed pancolitis-subsequently started on IV steroids on 6/26 with significant improvement overnight-only 1 BM in the past 24 hours-stools getting more formed.   Remicade infusion planned for 6/29 Per GI note-recent outpatient stool studies were negative for C. difficile. Await further recommendations from GI service.  Hypovolemic shock Secondary to worsening diarrhea  BP now stable after IV fluid resuscitation.  AKI Hemodynamically mediated due to hypotension/diarrhea/ARB use AKI has resolved with IV fluids-under the supportive care-ARB remains on hold.  CAD Recent LHC May 2025-no targets for PCI-medical management recommended. Currently without any anginal symptoms  HTN BP stable All antihypertensives on hold  HLD Repatha  at home  7 mm stone in the urinary bladder Supportive care Monitor-outpatient urology follow-up  Multiple nonobstructing bilateral renal calculi No hydronephrosis per CT report Outpatient urology follow-up  Left leg swelling Per patient-this has been attributed to varicose veins Recent outpatient Doppler negative for DVT Supportive care Compression stockings when able.  Class 1 Obesity Estimated body mass index is 34.58 kg/m as calculated from the following:   Height as of this encounter: 5' 10 (1.778 m).   Weight as of this encounter: 109.3 kg.  Code status:   Code Status: Full Code   DVT Prophylaxis: enoxaparin (LOVENOX) injection 40 mg Start: 03/30/24 1115 Place TED hose Start: 03/28/24 1112 SCDs Start: 03/26/24 1820   Family Communication: None at bedside   Disposition Plan: Status is: Inpatient Remains inpatient appropriate  because: Severity of illness   Planned Discharge Destination:Home   Diet: Diet Order             Diet full liquid Fluid consistency: Thin  Diet effective 1400                     Antimicrobial agents: Anti-infectives (From admission, onward)    Start     Dose/Rate Route Frequency Ordered Stop   03/27/24 1000  cefTRIAXone  (ROCEPHIN ) 2 g in sodium chloride  0.9 % 100 mL IVPB  Status:  Discontinued        2 g 200 mL/hr over 30 Minutes Intravenous Every 24 hours 03/26/24 1825 03/27/24 1317   03/26/24 2200  metroNIDAZOLE  (FLAGYL ) IVPB 500 mg  Status:  Discontinued        500 mg 100 mL/hr over 60 Minutes Intravenous Every 12 hours 03/26/24 1825 03/27/24 1317   03/26/24 1530  cefTRIAXone  (ROCEPHIN ) 2 g in sodium chloride  0.9 % 100 mL IVPB        2 g 200 mL/hr over 30 Minutes Intravenous Once 03/26/24 1529 03/26/24 1715   03/26/24 1530  metroNIDAZOLE  (FLAGYL ) IVPB 500 mg        500 mg 100 mL/hr over 60 Minutes Intravenous  Once 03/26/24 1529 03/26/24 1848        MEDICATIONS: Scheduled Meds:  cholecalciferol   1,000 Units Oral Daily   dicyclomine   40 mg Oral q morning   And   dicyclomine   20 mg Oral QHS   enoxaparin (LOVENOX) injection  40 mg Subcutaneous Q24H   fluticasone   1 spray Each Nare Daily   gabapentin   300 mg Oral q morning   And   gabapentin   600 mg Oral QHS   methylPREDNISolone  (SOLU-MEDROL ) injection  60 mg Intravenous Q12H   pantoprazole   40 mg Oral QAC breakfast   sodium chloride  flush  3 mL Intravenous Q12H   Continuous Infusions:  [START ON 03/31/2024] inFLIXimab-dyyb (INFLECTRA) 5 mg/kg = 500 mg in sodium chloride  0.9 % 250 mL infusion     PRN Meds:.acetaminophen  **OR** acetaminophen , sucralfate  **AND** lidocaine  **AND** alum & mag hydroxide-simeth, guaiFENesin , hydrALAZINE , ipratropium-albuterol , metoprolol  tartrate, naphazoline-glycerin, ondansetron  **OR** ondansetron  (ZOFRAN ) IV, senna-docusate, sodium chloride    I have personally reviewed  following labs and imaging studies  LABORATORY DATA: CBC: Recent Labs  Lab 03/26/24 1248 03/27/24 0500 03/28/24 0631 03/29/24 0735 03/30/24 0742  WBC 5.7 6.3 3.4* 4.9 7.2  NEUTROABS 3.7  --   --   --   --   HGB 11.4* 10.7* 10.3* 11.7* 11.6*  HCT 36.4* 34.0* 32.4* 37.0* 37.5*  MCV 84.8 85.0 83.9 86.2 86.6  PLT 252 216 218 278 272    Basic Metabolic Panel: Recent Labs  Lab 03/26/24 1248 03/27/24 0500 03/28/24 0631 03/29/24 0735 03/30/24 0742  NA 134* 136 135 136 141  K 3.5 3.6 3.5 4.2 4.2  CL 94* 107 105 104 107  CO2 27 25 20* 24 26  GLUCOSE 95 74 71 128* 150*  BUN 12 8 5* <5* <5*  CREATININE 2.40* 1.60* 1.20 1.29* 1.04  CALCIUM  8.3* 7.5* 7.7* 8.3* 8.5*  MG  --   --  1.7 1.9 2.0    GFR: Estimated  Creatinine Clearance: 83 mL/min (by C-G formula based on SCr of 1.04 mg/dL).  Liver Function Tests: Recent Labs  Lab 03/26/24 1248 03/27/24 0500  AST 19 19  ALT 17 16  ALKPHOS 41 37*  BILITOT 0.7 0.6  PROT 5.0* 4.5*  ALBUMIN 2.1* 1.9*   Recent Labs  Lab 03/26/24 1248  LIPASE 26   No results for input(s): AMMONIA in the last 168 hours.  Coagulation Profile: Recent Labs  Lab 03/26/24 1248  INR 1.1    Cardiac Enzymes: No results for input(s): CKTOTAL, CKMB, CKMBINDEX, TROPONINI in the last 168 hours.  BNP (last 3 results) No results for input(s): PROBNP in the last 8760 hours.  Lipid Profile: No results for input(s): CHOL, HDL, LDLCALC, TRIG, CHOLHDL, LDLDIRECT in the last 72 hours.  Thyroid Function Tests: No results for input(s): TSH, T4TOTAL, FREET4, T3FREE, THYROIDAB in the last 72 hours.  Anemia Panel: No results for input(s): VITAMINB12, FOLATE, FERRITIN, TIBC, IRON, RETICCTPCT in the last 72 hours.  Urine analysis:    Component Value Date/Time   COLORURINE YELLOW 03/26/2024 1353   APPEARANCEUR HAZY (A) 03/26/2024 1353   LABSPEC 1.005 03/26/2024 1353   PHURINE 6.0 03/26/2024 1353   GLUCOSEU  NEGATIVE 03/26/2024 1353   GLUCOSEU NEGATIVE 11/24/2009 1140   HGBUR NEGATIVE 03/26/2024 1353   BILIRUBINUR NEGATIVE 03/26/2024 1353   KETONESUR NEGATIVE 03/26/2024 1353   PROTEINUR NEGATIVE 03/26/2024 1353   UROBILINOGEN 0.2 05/28/2013 2350   NITRITE NEGATIVE 03/26/2024 1353   LEUKOCYTESUR NEGATIVE 03/26/2024 1353    Sepsis Labs: Lactic Acid, Venous    Component Value Date/Time   LATICACIDVEN 0.7 03/26/2024 1452    MICROBIOLOGY: Recent Results (from the past 240 hours)  Blood Culture (routine x 2)     Status: None (Preliminary result)   Collection Time: 03/26/24  1:53 PM   Specimen: BLOOD RIGHT ARM  Result Value Ref Range Status   Specimen Description BLOOD RIGHT ARM  Final   Special Requests   Final    BOTTLES DRAWN AEROBIC AND ANAEROBIC Blood Culture results may not be optimal due to an inadequate volume of blood received in culture bottles   Culture   Final    NO GROWTH 4 DAYS Performed at [facility], [address] [place], [place] [address]    Report Status PENDING  Incomplete  Blood Culture (routine x 2)     Status: None (Preliminary result)   Collection Time: 03/26/24  1:58 PM   Specimen: BLOOD RIGHT HAND  Result Value Ref Range Status   Specimen Description BLOOD RIGHT HAND  Final   Special Requests   Final    BOTTLES DRAWN AEROBIC ONLY Blood Culture results may not be optimal due to an inadequate volume of blood received in culture bottles   Culture   Final    NO GROWTH 4 DAYS Performed at [facility], [address] [place], [place] [address]    Report Status PENDING  Incomplete    RADIOLOGY STUDIES/RESULTS: No results found.    LOS: 4 days   Donalda Applebaum, MD  [facility]    To contact the attending provider between 7A-7P or the covering provider during after hours 7P-7A, please log into the web site [email or web address] and access using universal [place] password for that web site. If you do not have the password, please  call the hospital operator.  03/30/2024, 10:28 AM

## 2024-03-31 DIAGNOSIS — K529 Noninfective gastroenteritis and colitis, unspecified: Secondary | ICD-10-CM | POA: Diagnosis not present

## 2024-03-31 LAB — CBC
HCT: 35.6 % — ABNORMAL LOW (ref 39.0–52.0)
Hemoglobin: 11.1 g/dL — ABNORMAL LOW (ref 13.0–17.0)
MCH: 26.8 pg (ref 26.0–34.0)
MCHC: 31.2 g/dL (ref 30.0–36.0)
MCV: 86 fL (ref 80.0–100.0)
Platelets: 242 10*3/uL (ref 150–400)
RBC: 4.14 MIL/uL — ABNORMAL LOW (ref 4.22–5.81)
RDW: 16.5 % — ABNORMAL HIGH (ref 11.5–15.5)
WBC: 8.3 10*3/uL (ref 4.0–10.5)
nRBC: 0 % (ref 0.0–0.2)

## 2024-03-31 LAB — BASIC METABOLIC PANEL WITH GFR
Anion gap: 7 (ref 5–15)
BUN: 8 mg/dL (ref 8–23)
CO2: 26 mmol/L (ref 22–32)
Calcium: 8.4 mg/dL — ABNORMAL LOW (ref 8.9–10.3)
Chloride: 105 mmol/L (ref 98–111)
Creatinine, Ser: 1.13 mg/dL (ref 0.61–1.24)
GFR, Estimated: >60 mL/min (ref >60)
Glucose, Bld: 138 mg/dL — ABNORMAL HIGH (ref 70–99)
Potassium: 4.9 mmol/L (ref 3.5–5.1)
Sodium: 138 mmol/L (ref 135–145)

## 2024-03-31 LAB — CULTURE, BLOOD (ROUTINE X 2)
Culture: NO GROWTH
Culture: NO GROWTH

## 2024-03-31 LAB — MAGNESIUM: Magnesium: 2 mg/dL (ref 1.7–2.4)

## 2024-03-31 MED ORDER — DIPHENHYDRAMINE HCL 25 MG PO CAPS
25.0000 mg | ORAL_CAPSULE | Freq: Once | ORAL | Status: AC
  Administered 2024-03-31: 25 mg via ORAL
  Filled 2024-03-31: qty 1

## 2024-03-31 MED ORDER — ACETAMINOPHEN 325 MG PO TABS
650.0000 mg | ORAL_TABLET | Freq: Once | ORAL | Status: AC
  Administered 2024-03-31: 650 mg via ORAL
  Filled 2024-03-31: qty 2

## 2024-03-31 MED ORDER — SODIUM CHLORIDE 0.9 % IV BOLUS: 1000.0000 mL | Freq: Once | INTRAVENOUS | Status: DC | PRN

## 2024-03-31 MED ORDER — FAMOTIDINE IN NACL 20-0.9 MG/50ML-% IV SOLN: 20.0000 mg | Freq: Once | INTRAVENOUS | Status: DC | PRN

## 2024-03-31 MED ORDER — DIPHENHYDRAMINE HCL 50 MG/ML IJ SOLN: 50.0000 mg | Freq: Once | INTRAMUSCULAR | Status: DC | PRN

## 2024-03-31 MED ORDER — ALBUTEROL SULFATE (2.5 MG/3ML) 0.083% IN NEBU: 2.5000 mg | INHALATION_SOLUTION | Freq: Once | RESPIRATORY_TRACT | Status: DC | PRN

## 2024-03-31 MED ORDER — METHYLPREDNISOLONE SODIUM SUCC 125 MG IJ SOLR: 125.0000 mg | Freq: Once | INTRAMUSCULAR | Status: DC | PRN

## 2024-03-31 MED ORDER — EPINEPHRINE PF 1 MG/ML IJ SOLN: 0.3000 mg | INTRAMUSCULAR | Status: DC | PRN

## 2024-03-31 NOTE — Progress Notes (Signed)
 PROGRESS NOTE        PATIENT DETAILS Name: Russell Padilla Age: 68 y.o. Sex: male Date of Birth: April 02, 1956 Admit Date: 03/26/2024 Admitting Physician Deliliah Room, MD ERE:Dtjbwz, Alm, MD  Brief Summary: Patient is a 68 y.o.  male with history of ulcerative colitis on Humira/mesalamine who presented with worsening of diarrhea.  He was found to have hypotension secondary to hypovolemic shock along with AKI.  He was evaluated by GI and underwent colonoscopy which showed pancolitis.    Significant events: 6/24>> admit to [facility]-worsening diarrhea for the past several months 6/26>> colonoscopy with severe pan colitis-IV steroids started 6/27>> continues to have diarrhea-discussed with GI/pharmacy-infliximab infusion planned for this coming Sunday.  Significant studies: 6/03>> L LE Doppler: No  DVT. 6/24>> CT abdomen/pelvis: Diffuse colitis.  7 mm stone in the urinary bladder. 6/24>> CT head: No acute intracranial malady 6/24>> CXR: No PNA  Significant microbiology data: 6/24>> blood culture: No growth  Procedures: 6/26>> colonoscopy: Severe pancolitis-likely ulcerative colitis-IV Solu-Medrol  started.  Consults: [facility]  Subjective:  Patient in bed, appears comfortable, denies any headache, no fever, no chest pain or pressure, no shortness of breath , no abdominal pain, improving diarrhea. No new focal weakness.    Objective: Vitals: Blood pressure (!) 151/75, pulse 69, temperature 97.8 F (36.6 C), temperature source Oral, resp. rate 18, height 5' 10 (1.778 m), weight 109.3 kg, SpO2 99%.   Exam:  Awake Alert, No new F.N deficits, Normal affect [facility].AT,PERRAL Supple Neck, No JVD,   Symmetrical Chest wall movement, Good air movement bilaterally, CTAB RRR,No Gallops, Rubs or new Murmurs,  +ve B.Sounds, Abd Soft, No tenderness,   No Cyanosis, Clubbing or edema    Assessment/Plan: Worsening diarrhea secondary to severe ulcerative colitis  (pancolitis on colonoscopy on 6/26)  Refractory/severe colitis in spite of being on mesalamine and Humira-has had voluminous diarrhea since 2025-underwent colonoscopy which showed pancolitis-subsequently started on IV steroids on 6/26 with significant improvement overnight-only 1 BM in the past 24 hours-stools getting more formed.   Remicade infusion planned for 6/29 Per GI note-recent outpatient stool studies were negative for C. difficile. Await further recommendations from GI service.  Hypovolemic shock Secondary to worsening diarrhea  BP now stable after IV fluid resuscitation.  AKI Hemodynamically mediated due to hypotension/diarrhea/ARB use AKI has resolved with IV fluids-under the supportive care-ARB remains on hold.  CAD Recent LHC May 2025-no targets for PCI-medical management recommended. Currently without any anginal symptoms  HTN BP stable All antihypertensives on hold  HLD Repatha  at home  7 mm stone in the urinary bladder Supportive care Monitor-outpatient urology follow-up  Multiple nonobstructing bilateral renal calculi No hydronephrosis per CT report Outpatient urology follow-up  Left leg swelling Per patient-this has been attributed to varicose veins Recent outpatient Doppler negative for DVT Supportive care Compression stockings when able.  Class 1 Obesity Estimated body mass index is 34.58 kg/m as calculated from the following:   Height as of this encounter: 5' 10 (1.778 m).   Weight as of this encounter: 109.3 kg.   Code status:   Code Status: Full Code   DVT Prophylaxis: Place TED hose Start: 03/28/24 1112 SCDs Start: 03/26/24 1820   Family Communication: None at bedside   Disposition Plan: Status is: Inpatient Remains inpatient appropriate because: Severity of illness   Planned Discharge Destination:Home   Diet: Diet Order  DIET SOFT Room service appropriate? Yes; Fluid consistency: Thin  Diet effective now                      Antimicrobial agents: Anti-infectives (From admission, onward)    Start     Dose/Rate Route Frequency Ordered Stop   03/27/24 1000  cefTRIAXone  (ROCEPHIN ) 2 g in sodium chloride  0.9 % 100 mL IVPB  Status:  Discontinued        2 g 200 mL/hr over 30 Minutes Intravenous Every 24 hours 03/26/24 1825 03/27/24 1317   03/26/24 2200  metroNIDAZOLE  (FLAGYL ) IVPB 500 mg  Status:  Discontinued        500 mg 100 mL/hr over 60 Minutes Intravenous Every 12 hours 03/26/24 1825 03/27/24 1317   03/26/24 1530  cefTRIAXone  (ROCEPHIN ) 2 g in sodium chloride  0.9 % 100 mL IVPB        2 g 200 mL/hr over 30 Minutes Intravenous Once 03/26/24 1529 03/26/24 1715   03/26/24 1530  metroNIDAZOLE  (FLAGYL ) IVPB 500 mg        500 mg 100 mL/hr over 60 Minutes Intravenous  Once 03/26/24 1529 03/26/24 1848        MEDICATIONS: Scheduled Meds:  cholecalciferol   1,000 Units Oral Daily   dicyclomine   40 mg Oral q morning   And   dicyclomine   20 mg Oral QHS   enoxaparin (LOVENOX) injection  50 mg Subcutaneous Q24H   fluticasone   1 spray Each Nare Daily   gabapentin   300 mg Oral q morning   And   gabapentin   600 mg Oral QHS   methylPREDNISolone  (SOLU-MEDROL ) injection  60 mg Intravenous Q12H   pantoprazole   40 mg Oral QAC breakfast   sodium chloride  flush  3 mL Intravenous Q12H   Continuous Infusions:  inFLIXimab-dyyb (INFLECTRA) 5 mg/kg = 500 mg in sodium chloride  0.9 % 250 mL infusion     PRN Meds:.acetaminophen  **OR** acetaminophen , sucralfate  **AND** lidocaine  **AND** alum & mag hydroxide-simeth, guaiFENesin , hydrALAZINE , ipratropium-albuterol , metoprolol  tartrate, naphazoline-glycerin, ondansetron  **OR** ondansetron  (ZOFRAN ) IV, senna-docusate, sodium chloride    I have personally reviewed following labs and imaging studies  LABORATORY DATA: CBC: Recent Labs  Lab 03/26/24 1248 03/27/24 0500 03/28/24 0631 03/29/24 0735 03/30/24 0742 03/31/24 0815  WBC 5.7 6.3 3.4* 4.9 7.2 8.3   NEUTROABS 3.7  --   --   --   --   --   HGB 11.4* 10.7* 10.3* 11.7* 11.6* 11.1*  HCT 36.4* 34.0* 32.4* 37.0* 37.5* 35.6*  MCV 84.8 85.0 83.9 86.2 86.6 86.0  PLT 252 216 218 278 272 242    Basic Metabolic Panel: Recent Labs  Lab 03/27/24 0500 03/28/24 0631 03/29/24 0735 03/30/24 0742 03/31/24 0815  NA 136 135 136 141 138  K 3.6 3.5 4.2 4.2 4.9  CL 107 105 104 107 105  CO2 25 20* 24 26 26   GLUCOSE 74 71 128* 150* 138*  BUN 8 5* <5* <5* 8  CREATININE 1.60* 1.20 1.29* 1.04 1.13  CALCIUM  7.5* 7.7* 8.3* 8.5* 8.4*  MG  --  1.7 1.9 2.0 2.0    GFR: Estimated Creatinine Clearance: 76.4 mL/min (by C-G formula based on SCr of 1.13 mg/dL).  Liver Function Tests: Recent Labs  Lab 03/26/24 1248 03/27/24 0500  AST 19 19  ALT 17 16  ALKPHOS 41 37*  BILITOT 0.7 0.6  PROT 5.0* 4.5*  ALBUMIN 2.1* 1.9*   Recent Labs  Lab 03/26/24 1248  LIPASE 26   No results for  input(s): AMMONIA in the last 168 hours.  Coagulation Profile: Recent Labs  Lab 03/26/24 1248  INR 1.1    Cardiac Enzymes: No results for input(s): CKTOTAL, CKMB, CKMBINDEX, TROPONINI in the last 168 hours.  BNP (last 3 results) No results for input(s): PROBNP in the last 8760 hours.  Lipid Profile: No results for input(s): CHOL, HDL, LDLCALC, TRIG, CHOLHDL, LDLDIRECT in the last 72 hours.  Thyroid Function Tests: No results for input(s): TSH, T4TOTAL, FREET4, T3FREE, THYROIDAB in the last 72 hours.  Anemia Panel: No results for input(s): VITAMINB12, FOLATE, FERRITIN, TIBC, IRON, RETICCTPCT in the last 72 hours.  Urine analysis:    Component Value Date/Time   COLORURINE YELLOW 03/26/2024 1353   APPEARANCEUR HAZY (A) 03/26/2024 1353   LABSPEC 1.005 03/26/2024 1353   PHURINE 6.0 03/26/2024 1353   GLUCOSEU NEGATIVE 03/26/2024 1353   GLUCOSEU NEGATIVE 11/24/2009 1140   HGBUR NEGATIVE 03/26/2024 1353   BILIRUBINUR NEGATIVE 03/26/2024 1353   KETONESUR  NEGATIVE 03/26/2024 1353   PROTEINUR NEGATIVE 03/26/2024 1353   UROBILINOGEN 0.2 05/28/2013 2350   NITRITE NEGATIVE 03/26/2024 1353   LEUKOCYTESUR NEGATIVE 03/26/2024 1353    Sepsis Labs: Lactic Acid, Venous    Component Value Date/Time   LATICACIDVEN 0.7 03/26/2024 1452    MICROBIOLOGY: Recent Results (from the past 240 hours)  Blood Culture (routine x 2)     Status: None   Collection Time: 03/26/24  1:53 PM   Specimen: BLOOD RIGHT ARM  Result Value Ref Range Status   Specimen Description BLOOD RIGHT ARM  Final   Special Requests   Final    BOTTLES DRAWN AEROBIC AND ANAEROBIC Blood Culture results may not be optimal due to an inadequate volume of blood received in culture bottles   Culture   Final    NO GROWTH 5 DAYS Performed at [facility], [address] [place], [place] [address]    Report Status 03/31/2024 FINAL  Final  Blood Culture (routine x 2)     Status: None   Collection Time: 03/26/24  1:58 PM   Specimen: BLOOD RIGHT HAND  Result Value Ref Range Status   Specimen Description BLOOD RIGHT HAND  Final   Special Requests   Final    BOTTLES DRAWN AEROBIC ONLY Blood Culture results may not be optimal due to an inadequate volume of blood received in culture bottles   Culture   Final    NO GROWTH 5 DAYS Performed at [facility], [address] [place], [place] [address]    Report Status 03/31/2024 FINAL  Final    RADIOLOGY STUDIES/RESULTS: No results found.    LOS: 5 days   Lavada Stank, MD  [facility]    To contact the attending provider between 7A-7P or the covering provider during after hours 7P-7A, please log into the web site [email or web address] and access using universal [place] password for that web site. If you do not have the password, please call the hospital operator.  03/31/2024, 11:00 AM

## 2024-03-31 NOTE — TOC Initial Note (Signed)
 Transition of Care [facility]) - Initial/Assessment Note    Patient Details  Name: Russell Padilla MRN: [id number] Date of Birth: 1956/03/27  [facility] CM/SW Contact:    Marval Gell, RN Phone Number: 03/31/2024, 12:42 PM  Clinical Narrative:                  Per chart review.  Independent patient from home admitted with colitis, hypovolemic shock. Insured with primary care listed.  No TOC needs identified at this for DC, will continue to follow.  Expected Discharge Plan: Home/Self Care Barriers to Discharge: Continued Medical Work up   Patient Goals and CMS Choice Patient states their goals for this hospitalization and ongoing recovery are:: return home          Expected Discharge Plan and Services   Discharge Planning Services: CM Consult   Living arrangements for the past 2 months: Single Family Home                                      Prior Living Arrangements/Services Living arrangements for the past 2 months: Single Family Home Lives with:: Self                   Activities of Daily Living      Permission Sought/Granted                  Emotional Assessment              Admission diagnosis:  Dehydration [E86.0] Colitis [K52.9] Diarrhea, unspecified type [R19.7] Patient Active Problem List   Diagnosis Date Noted   Diarrhea 03/28/2024   Colitis 03/26/2024   Iron deficiency anemia 11/21/2023   Right lumbar radiculopathy 03/01/2016   Chronic headache 03/01/2016   Frequent headaches 08/11/2015   Chronic neck pain 08/11/2015   Chronic low back pain 08/11/2015   Coronary artery disease involving native coronary artery of native heart without angina pectoris 04/13/2015   Abnormal stress test    Pain of upper abdomen 02/16/2015   Change in blood platelet count 02/16/2015   Essential hypertension    HLD (hyperlipidemia)    Benign prostatic hyperplasia without urinary obstruction 12/22/2014   Decreased libido 12/22/2014    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   Dyspnea 12/31/2013   Left elbow pain 11/29/2013   Chest pain, non-cardiac 10/16/2013   Chest pain 01/16/2013   Bradycardia 01/16/2013   Chest pain at rest 12/05/2012   Achalasia 07/21/2011   Hemorrhoids 11/02/2010   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 [phone] - [place], [place] - [address] AT [facility] OF [address] [place] [address] Phone: [phone] Fax: [phone]     Social Drivers of Health (SDOH) Social History: SDOH Screenings   Food Insecurity: No Food Insecurity (03/27/2024)  Housing: Low Risk  (03/27/2024)  Transportation Needs: No Transportation Needs (03/27/2024)  Utilities: Not At Risk (03/27/2024)  Depression (PHQ2-9): Low Risk  (11/29/2023)  Social  Connections: Unknown (03/27/2024)  Tobacco Use: Medium Risk (03/26/2024)   SDOH Interventions:     Readmission Risk Interventions     No data to display

## 2024-03-31 NOTE — Plan of Care (Signed)

## 2024-03-31 NOTE — Progress Notes (Signed)
 [facility] Progress Note  SUBJECTIVE:   Interval history: Russell Padilla was seen and evaluated today at bedside.  Resting comfortably.  Just completed first infliximab infusion.  Tolerating diet well.  No blood per rectum.  Having Bristol type IV stools.  No abdominal pain.  No chest pain or shortness of breath.  Past Medical History:  Diagnosis Date   Abdominal pain 07/11/2013   ongoing and unspecified   Arthritis    Bladder stone 07/11/2013   surgery planned   CAD (coronary artery disease)    a. 12/2012 Cath/PCI: DES to superior branch of OM2;  b. Repeat cath 4/14 with 20% LAD, patent stent OM3, 70% diffuse stenosis small, non-dominant RCA.   Essential hypertension    Headache    HLD (hyperlipidemia)    Renal disorder    renal calculi   Stomach pain    chronic   Past Surgical History:  Procedure Laterality Date   BACK SURGERY     2 cervical/ 2 lumbar fusions   BIOPSY OF SKIN SUBCUTANEOUS TISSUE AND/OR MUCOUS MEMBRANE  03/28/2024   Procedure: BIOPSY, SKIN, SUBCUTANEOUS TISSUE, OR MUCOUS MEMBRANE;  Surgeon: Dianna Specking, MD;  Location: [facility] ENDOSCOPY;  Service: Gastroenterology;;   CARDIAC CATHETERIZATION N/A 03/04/2015   Procedure: Left Heart Cath and Coronary Angiography;  Surgeon: Lonni JONETTA Cash, MD;  Location: [facility] INVASIVE CV LAB;  Service: Cardiovascular;  Laterality: N/A;   CHOLECYSTECTOMY     laparoscopic.   COLONOSCOPY N/A 03/28/2024   Procedure: COLONOSCOPY;  Surgeon: Dianna Specking, MD;  Location: [facility] ENDOSCOPY;  Service: Gastroenterology;  Laterality: N/A;   CORONARY STENT PLACEMENT     3'14   ESOPHAGOGASTRODUODENOSCOPY ENDOSCOPY     multiple times-[place]/ and now being followed by Endo- [facility]   KNEE ARTHROSCOPY Left    '80's   LEFT HEART CATH AND CORONARY ANGIOGRAPHY N/A 02/16/2024   Procedure: LEFT HEART CATH AND CORONARY ANGIOGRAPHY;  Surgeon: Cash Lonni JONETTA, MD;  Location: [facility] INVASIVE CV LAB;  Service: Cardiovascular;   Laterality: N/A;   LEFT HEART CATHETERIZATION WITH CORONARY ANGIOGRAM N/A 12/05/2012   Procedure: LEFT HEART CATHETERIZATION WITH CORONARY ANGIOGRAM;  Surgeon: Salena GORMAN Negri, MD;  Location: [facility] CATH LAB;  Service: Cardiovascular;  Laterality: N/A;   LEFT HEART CATHETERIZATION WITH CORONARY ANGIOGRAM N/A 01/17/2013   Procedure: LEFT HEART CATHETERIZATION WITH CORONARY ANGIOGRAM;  Surgeon: Deatrice DELENA Cage, MD;  Location: [facility] CATH LAB;  Service: Cardiovascular;  Laterality: N/A;   PERCUTANEOUS CORONARY STENT INTERVENTION (PCI-S) N/A 12/06/2012   Procedure: PERCUTANEOUS CORONARY STENT INTERVENTION (PCI-S);  Surgeon: Lonni JONETTA Cash, MD;  Location: [facility] CATH LAB;  Service: Cardiovascular;  Laterality: N/A;   Current Facility-Administered Medications  Medication Dose Route Frequency Provider Last Rate Last Admin   acetaminophen  (TYLENOL ) tablet 650 mg  650 mg Oral Q6H PRN Dianna Specking, MD   650 mg at 03/27/24 1107   Or   acetaminophen  (TYLENOL ) suppository 650 mg  650 mg Rectal Q6H PRN Dianna Specking, MD       albuterol  (PROVENTIL ) (2.5 MG/3ML) 0.083% nebulizer solution 2.5 mg  2.5 mg Nebulization Once PRN Singh, Prashant K, MD       sucralfate  (CARAFATE ) 1 GM/10ML suspension 1 g  1 g Oral TID PRN Raenelle Donalda HERO, MD   1 g at 03/29/24 2122   And   lidocaine  (XYLOCAINE ) 2 % viscous mouth solution 15 mL  15 mL Mouth/Throat TID PRN Raenelle Donalda HERO, MD   15 mL at 03/29/24 2122   And  alum & mag hydroxide-simeth (MAALOX/MYLANTA) 200-200-20 MG/5ML suspension 15 mL  15 mL Oral TID PRN Raenelle Donalda HERO, MD   15 mL at 03/29/24 2122   cholecalciferol  (VITAMIN D3) 25 MCG (1000 UNIT) tablet 1,000 Units  1,000 Units Oral Daily Dianna Specking, MD   1,000 Units at 03/31/24 9044   dicyclomine  (BENTYL ) tablet 40 mg  40 mg Oral q morning Dianna Specking, MD   40 mg at 03/31/24 9045   And   dicyclomine  (BENTYL ) tablet 20 mg  20 mg Oral QHS Dianna Specking, MD   20 mg at 03/30/24 1606    diphenhydrAMINE  (BENADRYL ) injection 50 mg  50 mg Intravenous Once PRN Singh, Prashant K, MD       enoxaparin (LOVENOX) injection 50 mg  50 mg Subcutaneous Q24H Raenelle Donalda HERO, MD   50 mg at 03/30/24 2119   EPINEPHrine (ADRENALIN) 0.3 mg  0.3 mg Intramuscular Q5 min PRN Singh, Prashant K, MD       famotidine  (PEPCID ) IVPB 20 mg premix  20 mg Intravenous Once PRN Singh, Prashant K, MD       fluticasone  (FLONASE ) 50 MCG/ACT nasal spray 1 spray  1 spray Each Nare Daily Dianna Specking, MD   1 spray at 03/31/24 1137   gabapentin  (NEURONTIN ) capsule 300 mg  300 mg Oral q morning Dianna Specking, MD   300 mg at 03/31/24 9044   And   gabapentin  (NEURONTIN ) capsule 600 mg  600 mg Oral QHS Dianna Specking, MD   600 mg at 03/30/24 2117   guaiFENesin  (ROBITUSSIN) 100 MG/5ML liquid 5 mL  5 mL Oral Q4H PRN Dianna Specking, MD       hydrALAZINE  (APRESOLINE ) injection 10 mg  10 mg Intravenous Q4H PRN Dianna Specking, MD       ipratropium-albuterol  (DUONEB) 0.5-2.5 (3) MG/3ML nebulizer solution 3 mL  3 mL Nebulization Q4H PRN Dianna Specking, MD       methylPREDNISolone  sodium succinate (SOLU-MEDROL ) 125 mg/2 mL injection 125 mg  125 mg Intravenous Once PRN Singh, Prashant K, MD       methylPREDNISolone  sodium succinate (SOLU-MEDROL ) 125 mg/2 mL injection 60 mg  60 mg Intravenous Q12H Dianna Specking, MD   60 mg at 03/31/24 9045   metoprolol  tartrate (LOPRESSOR ) injection 5 mg  5 mg Intravenous Q4H PRN Dianna Specking, MD       naphazoline-glycerin (CLEAR EYES REDNESS) ophth solution 1-2 drop  1-2 drop Both Eyes QID PRN Ghimire, Donalda HERO, MD       ondansetron  (ZOFRAN ) tablet 4 mg  4 mg Oral Q6H PRN Dianna Specking, MD       Or   ondansetron  (ZOFRAN ) injection 4 mg  4 mg Intravenous Q6H PRN Dianna Specking, MD   4 mg at 03/28/24 0114   pantoprazole  (PROTONIX ) EC tablet 40 mg  40 mg Oral QAC breakfast Dianna Specking, MD   40 mg at 03/31/24 0955   senna-docusate (Senokot-S) tablet 1  tablet  1 tablet Oral QHS PRN Dianna Specking, MD       sodium chloride  (OCEAN) 0.65 % nasal spray 1 spray  1 spray Each Nare PRN Dianna Specking, MD   1 spray at 03/31/24 0956   sodium chloride  0.9 % bolus 1,000 mL  1,000 mL Intravenous Once PRN Singh, Prashant K, MD       sodium chloride  flush (NS) 0.9 % injection 3 mL  3 mL Intravenous Q12H Dianna Specking, MD   3 mL at 03/31/24 1136   Allergies as  of 03/26/2024 - Review Complete 03/26/2024  Allergen Reaction Noted   Crestor [rosuvastatin calcium ] Other (See Comments) 06/29/2017   Latex Hives and Rash 11/27/2009   Review of Systems:  Review of Systems  Respiratory:  Negative for shortness of breath.   Cardiovascular:  Negative for chest pain.  Gastrointestinal:  Negative for abdominal pain, blood in stool, nausea and vomiting.    OBJECTIVE:   Temp:  [97.8 F (36.6 C)-98.1 F (36.7 C)] 97.8 F (36.6 C) (06/29 0312) Pulse Rate:  [53-69] 53 (06/29 1146) Resp:  [17-18] 18 (06/29 0312) BP: (116-155)/(72-85) 155/85 (06/29 1146) SpO2:  [94 %-99 %] 94 % (06/29 1146) Last BM Date : 03/29/24 Physical Exam Constitutional:      General: He is not in acute distress.    Appearance: He is not ill-appearing, toxic-appearing or diaphoretic.   Cardiovascular:     Rate and Rhythm: Normal rate and regular rhythm.  Pulmonary:     Effort: No respiratory distress.     Breath sounds: Normal breath sounds.  Abdominal:     General: There is no distension.     Palpations: Abdomen is soft.     Tenderness: There is no abdominal tenderness. There is no guarding.   Neurological:     Mental Status: He is alert.     Labs: Recent Labs    03/29/24 0735 03/30/24 0742 03/31/24 0815  WBC 4.9 7.2 8.3  HGB 11.7* 11.6* 11.1*  HCT 37.0* 37.5* 35.6*  PLT 278 272 242   BMET Recent Labs    03/29/24 0735 03/30/24 0742 03/31/24 0815  NA 136 141 138  K 4.2 4.2 4.9  CL 104 107 105  CO2 24 26 26   GLUCOSE 128* 150* 138*  BUN <5* <5* 8   CREATININE 1.29* 1.04 1.13  CALCIUM  8.3* 8.5* 8.4*   LFT No results for input(s): PROT, ALBUMIN, AST, ALT, ALKPHOS, BILITOT, BILIDIR, IBILI in the last 72 hours. PT/INR No results for input(s): LABPROT, INR in the last 72 hours. Diagnostic imaging: No results found.  IMPRESSION: Pan-colonic ulcerative colitis             -Incomplete response to 5-ASA             -Possible primary non-response to adalimumab in outpatient setting             -Infliximab initiated on 03/31/2024 Coronary artery disease Hypertension Hyperlipidemia    PLAN: -Plan to coordinate further outpatient infliximab induction, my office will reach out to patient on discharge, plan for repeat dose in 2 weeks, then 6 weeks, then every 8 weeks thereafter for maintenance -Possibly able to change to oral steroids tomorrow, would recommend prednisone  40 mg x 7 days, then prednisone  30 mg x 7 days, then prednisone  20 mg x 2 weeks, then prednisone  10 mg x 2 weeks, further taper instructions pending clinical course and follow-up in office -If, along with steroids, infliximab does not induce remission will explore alternative biologic agents including upadacitinib (patient noted that he has had Shingrix vaccine x 2, no reported venous thromboembolism) -Low fiber diet for now -[facility] will follow   LOS: 5 days   Estefana Keas, [facility]

## 2024-04-01 DIAGNOSIS — K529 Noninfective gastroenteritis and colitis, unspecified: Secondary | ICD-10-CM | POA: Diagnosis not present

## 2024-04-01 LAB — BASIC METABOLIC PANEL WITH GFR
Anion gap: 6 (ref 5–15)
BUN: 14 mg/dL (ref 8–23)
CO2: 28 mmol/L (ref 22–32)
Calcium: 8.5 mg/dL — ABNORMAL LOW (ref 8.9–10.3)
Chloride: 103 mmol/L (ref 98–111)
Creatinine, Ser: 1.15 mg/dL (ref 0.61–1.24)
GFR, Estimated: >60 mL/min (ref >60)
Glucose, Bld: 132 mg/dL — ABNORMAL HIGH (ref 70–99)
Potassium: 4.4 mmol/L (ref 3.5–5.1)
Sodium: 137 mmol/L (ref 135–145)

## 2024-04-01 LAB — CBC
HCT: 36.7 % — ABNORMAL LOW (ref 39.0–52.0)
Hemoglobin: 11.5 g/dL — ABNORMAL LOW (ref 13.0–17.0)
MCH: 27.2 pg (ref 26.0–34.0)
MCHC: 31.3 g/dL (ref 30.0–36.0)
MCV: 86.8 fL (ref 80.0–100.0)
Platelets: 255 10*3/uL (ref 150–400)
RBC: 4.23 MIL/uL (ref 4.22–5.81)
RDW: 16.8 % — ABNORMAL HIGH (ref 11.5–15.5)
WBC: 9.4 10*3/uL (ref 4.0–10.5)
nRBC: 0 % (ref 0.0–0.2)

## 2024-04-01 LAB — MAGNESIUM: Magnesium: 2 mg/dL (ref 1.7–2.4)

## 2024-04-01 MED ORDER — PREDNISONE 5 MG PO TABS: 10.0000 mg | ORAL_TABLET | Freq: Every day | ORAL | Status: DC

## 2024-04-01 MED ORDER — PREDNISONE 5 MG PO TABS: 30.0000 mg | ORAL_TABLET | Freq: Every day | ORAL | Status: DC

## 2024-04-01 MED ORDER — PREDNISONE 20 MG PO TABS: 20.0000 mg | ORAL_TABLET | Freq: Every day | ORAL | Status: DC

## 2024-04-01 MED ORDER — PREDNISONE 20 MG PO TABS
40.0000 mg | ORAL_TABLET | Freq: Every day | ORAL | Status: DC
  Administered 2024-04-02 – 2024-04-03 (×2): 40 mg via ORAL
  Filled 2024-04-01 (×2): qty 2

## 2024-04-01 MED ORDER — IRBESARTAN 75 MG PO TABS
37.5000 mg | ORAL_TABLET | Freq: Every day | ORAL | Status: DC
  Administered 2024-04-01 – 2024-04-03 (×3): 37.5 mg via ORAL
  Filled 2024-04-01 (×3): qty 0.5

## 2024-04-01 NOTE — Progress Notes (Signed)
 PROGRESS NOTE        PATIENT DETAILS Name: Russell Padilla Age: 68 y.o. Sex: male Date of Birth: January 31, 1956 Admit Date: 03/26/2024 Admitting Physician Deliliah Room, MD ERE:[id number], Alm, MD  Brief Summary: Patient is a 68 y.o.  male with history of ulcerative colitis on Humira/mesalamine who presented with worsening of diarrhea.  He was found to have hypotension secondary to hypovolemic shock along with AKI.  He was evaluated by GI and underwent colonoscopy which showed pancolitis.    Significant events: 6/24>> admit to [facility]-worsening diarrhea for the past several months 6/26>> colonoscopy with severe pan colitis-IV steroids started 6/27>> continues to have diarrhea-discussed with GI/pharmacy-infliximab infusion planned for this coming Sunday.  Significant studies: 6/03>> L LE Doppler: No  DVT. 6/24>> CT abdomen/pelvis: Diffuse colitis.  7 mm stone in the urinary bladder. 6/24>> CT head: No acute intracranial malady 6/24>> CXR: No PNA  Significant microbiology data: 6/24>> blood culture: No growth  Procedures: 6/26>> colonoscopy: Severe pancolitis-likely ulcerative colitis-IV Solu-Medrol  started.  Consults: [facility]  Subjective:  Patient in bed, appears comfortable, denies any headache, no fever, no chest pain or pressure, no shortness of breath , no abdominal pain but did have diarrhea earlier this morning. No new focal weakness.     Objective: Vitals: Blood pressure (!) 144/86, pulse (!) 57, temperature 97.7 F (36.5 C), temperature source Oral, resp. rate 20, height 5' 10 (1.778 m), weight 109.3 kg, SpO2 94%.   Exam:  Awake Alert, No new F.N deficits, Normal affect .AT,PERRAL Supple Neck, No JVD,   Symmetrical Chest wall movement, Good air movement bilaterally, CTAB RRR,No Gallops, Rubs or new Murmurs,  +ve B.Sounds, Abd Soft, No tenderness,   No Cyanosis, Clubbing or edema    Assessment/Plan: Worsening diarrhea secondary to  severe ulcerative colitis (pancolitis on colonoscopy on 6/26)  Refractory/severe colitis in spite of being on mesalamine and Humira-has had voluminous diarrhea since 2025-underwent colonoscopy which showed pancolitis-subsequently started on IV steroids on 6/26 with significant improvement  He received his first Remicade infusion on 03/31/2024, case discussed with GI, continue IV steroids for another day if stable can discharge on 04/02/2024 with outpatient follow-up with [facility]. All improving but had significant diarrhea earlier this morning.  Hypovolemic shock Secondary to worsening diarrhea  BP now stable after IV fluid resuscitation.  AKI Hemodynamically mediated due to hypotension/diarrhea/ARB use AKI has resolved with IV fluids-under the supportive care-ARB remains on hold.  CAD Recent LHC May 2025-no targets for PCI-medical management recommended. Currently without any anginal symptoms  HTN BP stable All antihypertensives on hold  HLD Repatha  at home  7 mm stone in the urinary bladder Supportive care Monitor-outpatient urology follow-up  Multiple nonobstructing bilateral renal calculi No hydronephrosis per CT report Outpatient urology follow-up  Left leg swelling Per patient-this has been attributed to varicose veins Recent outpatient Doppler negative for DVT Supportive care Compression stockings when able.  Class 1 Obesity Estimated body mass index is 34.58 kg/m as calculated from the following:   Height as of this encounter: 5' 10 (1.778 m).   Weight as of this encounter: 109.3 kg.   Code status:   Code Status: Full Code   DVT Prophylaxis: Place TED hose Start: 03/28/24 1112 SCDs Start: 03/26/24 1820   Family Communication: None at bedside   Disposition Plan: Status is: Inpatient Remains inpatient appropriate because: Severity of  illness   Planned Discharge Destination:Home   Diet: Diet Order             DIET SOFT Room service appropriate? Yes;  Fluid consistency: Thin  Diet effective now                    MEDICATIONS: Scheduled Meds:  cholecalciferol   1,000 Units Oral Daily   dicyclomine   40 mg Oral q morning   And   dicyclomine   20 mg Oral QHS   enoxaparin (LOVENOX) injection  50 mg Subcutaneous Q24H   fluticasone   1 spray Each Nare Daily   gabapentin   300 mg Oral q morning   And   gabapentin   600 mg Oral QHS   methylPREDNISolone  (SOLU-MEDROL ) injection  60 mg Intravenous Q12H   pantoprazole   40 mg Oral QAC breakfast   sodium chloride  flush  3 mL Intravenous Q12H   Continuous Infusions:  famotidine  (PEPCID ) IV     sodium chloride      PRN Meds:.acetaminophen  **OR** acetaminophen , albuterol , sucralfate  **AND** lidocaine  **AND** alum & mag hydroxide-simeth, diphenhydrAMINE , EPINEPHrine, famotidine  (PEPCID ) IV, guaiFENesin , hydrALAZINE , ipratropium-albuterol , methylPREDNISolone  (SOLU-MEDROL ) injection, metoprolol  tartrate, naphazoline-glycerin, ondansetron  **OR** ondansetron  (ZOFRAN ) IV, senna-docusate, sodium chloride , sodium chloride    I have personally reviewed following labs and imaging studies  LABORATORY DATA: CBC: Recent Labs  Lab 03/26/24 1248 03/27/24 0500 03/28/24 0631 03/29/24 0735 03/30/24 0742 03/31/24 0815 04/01/24 0605  WBC 5.7   < > 3.4* 4.9 7.2 8.3 9.4  NEUTROABS 3.7  --   --   --   --   --   --   HGB 11.4*   < > 10.3* 11.7* 11.6* 11.1* 11.5*  HCT 36.4*   < > 32.4* 37.0* 37.5* 35.6* 36.7*  MCV 84.8   < > 83.9 86.2 86.6 86.0 86.8  PLT 252   < > 218 278 272 242 255   < > = values in this interval not displayed.    Basic Metabolic Panel: Recent Labs  Lab 03/28/24 0631 03/29/24 0735 03/30/24 0742 03/31/24 0815 04/01/24 0605  NA 135 136 141 138 137  K 3.5 4.2 4.2 4.9 4.4  CL 105 104 107 105 103  CO2 20* 24 26 26 28   GLUCOSE 71 128* 150* 138* 132*  BUN 5* <5* <5* 8 14  CREATININE 1.20 1.29* 1.04 1.13 1.15  CALCIUM  7.7* 8.3* 8.5* 8.4* 8.5*  MG 1.7 1.9 2.0 2.0 2.0     GFR: Estimated Creatinine Clearance: 75 mL/min (by C-G formula based on SCr of 1.15 mg/dL).  Liver Function Tests: Recent Labs  Lab 03/26/24 1248 03/27/24 0500  AST 19 19  ALT 17 16  ALKPHOS 41 37*  BILITOT 0.7 0.6  PROT 5.0* 4.5*  ALBUMIN 2.1* 1.9*   Recent Labs  Lab 03/26/24 1248  LIPASE 26   No results for input(s): AMMONIA in the last 168 hours.  Coagulation Profile: Recent Labs  Lab 03/26/24 1248  INR 1.1    Cardiac Enzymes: No results for input(s): CKTOTAL, CKMB, CKMBINDEX, TROPONINI in the last 168 hours.  BNP (last 3 results) No results for input(s): PROBNP in the last 8760 hours.  Lipid Profile: No results for input(s): CHOL, HDL, LDLCALC, TRIG, CHOLHDL, LDLDIRECT in the last 72 hours.  Thyroid Function Tests: No results for input(s): TSH, T4TOTAL, FREET4, T3FREE, THYROIDAB in the last 72 hours.  Anemia Panel: No results for input(s): VITAMINB12, FOLATE, FERRITIN, TIBC, IRON, RETICCTPCT in the last 72 hours.  Urine analysis:  Component Value Date/Time   COLORURINE YELLOW 03/26/2024 1353   APPEARANCEUR HAZY (A) 03/26/2024 1353   LABSPEC 1.005 03/26/2024 1353   PHURINE 6.0 03/26/2024 1353   GLUCOSEU NEGATIVE 03/26/2024 1353   GLUCOSEU NEGATIVE 11/24/2009 1140   HGBUR NEGATIVE 03/26/2024 1353   BILIRUBINUR NEGATIVE 03/26/2024 1353   KETONESUR NEGATIVE 03/26/2024 1353   PROTEINUR NEGATIVE 03/26/2024 1353   UROBILINOGEN 0.2 05/28/2013 2350   NITRITE NEGATIVE 03/26/2024 1353   LEUKOCYTESUR NEGATIVE 03/26/2024 1353    Sepsis Labs: Lactic Acid, Venous    Component Value Date/Time   LATICACIDVEN 0.7 03/26/2024 1452    MICROBIOLOGY: Recent Results (from the past 240 hours)  Blood Culture (routine x 2)     Status: None   Collection Time: 03/26/24  1:53 PM   Specimen: BLOOD RIGHT ARM  Result Value Ref Range Status   Specimen Description BLOOD RIGHT ARM  Final   Special Requests   Final     BOTTLES DRAWN AEROBIC AND ANAEROBIC Blood Culture results may not be optimal due to an inadequate volume of blood received in culture bottles   Culture   Final    NO GROWTH 5 DAYS Performed at [facility], [address], [place], [place] [address]    Report Status 03/31/2024 FINAL  Final  Blood Culture (routine x 2)     Status: None   Collection Time: 03/26/24  1:58 PM   Specimen: BLOOD RIGHT HAND  Result Value Ref Range Status   Specimen Description BLOOD RIGHT HAND  Final   Special Requests   Final    BOTTLES DRAWN AEROBIC ONLY Blood Culture results may not be optimal due to an inadequate volume of blood received in culture bottles   Culture   Final    NO GROWTH 5 DAYS Performed at [facility], [address], [place], [place] [address]    Report Status 03/31/2024 FINAL  Final    RADIOLOGY STUDIES/RESULTS: No results found.    LOS: 6 days   Lavada Stank, MD  [facility]    To contact the attending provider between 7A-7P or the covering provider during after hours 7P-7A, please log into the web site [email or web address] and access using universal [place] password for that web site. If you do not have the password, please call the hospital operator.  04/01/2024, 9:52 AM

## 2024-04-01 NOTE — Progress Notes (Signed)
 Keep IV steroids today and start PO prednisone  taper per GI's recommendation tomorrow per Dr. Dennise.  Sergio Batch, PharmD, BCIDP, AAHIVP, CPP Infectious Disease Pharmacist 04/01/2024 1:42 PM

## 2024-04-01 NOTE — Progress Notes (Signed)
 [facility] Progress Note  SUBJECTIVE:   Interval history: Russell Padilla was seen and evaluated today at bedside. Ambulating through room. Denied abdominal pain. Had loose bowel movement early this AM which was concerning for him. Had a loose/not liquid bowel movement since that time. No chest pain or shortness of breath.   Past Medical History:  Diagnosis Date   Abdominal pain 07/11/2013   ongoing and unspecified   Arthritis    Bladder stone 07/11/2013   surgery planned   CAD (coronary artery disease)    a. 12/2012 Cath/PCI: DES to superior branch of OM2;  b. Repeat cath 4/14 with 20% LAD, patent stent OM3, 70% diffuse stenosis small, non-dominant RCA.   Essential hypertension    Headache    HLD (hyperlipidemia)    Renal disorder    renal calculi   Stomach pain    chronic   Past Surgical History:  Procedure Laterality Date   BACK SURGERY     2 cervical/ 2 lumbar fusions   BIOPSY OF SKIN SUBCUTANEOUS TISSUE AND/OR MUCOUS MEMBRANE  03/28/2024   Procedure: BIOPSY, SKIN, SUBCUTANEOUS TISSUE, OR MUCOUS MEMBRANE;  Surgeon: Dianna Specking, MD;  Location: [facility] ENDOSCOPY;  Service: Gastroenterology;;   CARDIAC CATHETERIZATION N/A 03/04/2015   Procedure: Left Heart Cath and Coronary Angiography;  Surgeon: Lonni JONETTA Cash, MD;  Location: [facility] INVASIVE CV LAB;  Service: Cardiovascular;  Laterality: N/A;   CHOLECYSTECTOMY     laparoscopic.   COLONOSCOPY N/A 03/28/2024   Procedure: COLONOSCOPY;  Surgeon: Dianna Specking, MD;  Location: [facility] ENDOSCOPY;  Service: Gastroenterology;  Laterality: N/A;   CORONARY STENT PLACEMENT     3'14   ESOPHAGOGASTRODUODENOSCOPY ENDOSCOPY     multiple times-[facility]/ and now being followed by Endo- [facility]   KNEE ARTHROSCOPY Left    '80's   LEFT HEART CATH AND CORONARY ANGIOGRAPHY N/A 02/16/2024   Procedure: LEFT HEART CATH AND CORONARY ANGIOGRAPHY;  Surgeon: Cash Lonni JONETTA, MD;  Location: [facility] INVASIVE CV LAB;  Service:  Cardiovascular;  Laterality: N/A;   LEFT HEART CATHETERIZATION WITH CORONARY ANGIOGRAM N/A 12/05/2012   Procedure: LEFT HEART CATHETERIZATION WITH CORONARY ANGIOGRAM;  Surgeon: Salena GORMAN Negri, MD;  Location: [facility] CATH LAB;  Service: Cardiovascular;  Laterality: N/A;   LEFT HEART CATHETERIZATION WITH CORONARY ANGIOGRAM N/A 01/17/2013   Procedure: LEFT HEART CATHETERIZATION WITH CORONARY ANGIOGRAM;  Surgeon: Deatrice DELENA Cage, MD;  Location: [facility] CATH LAB;  Service: Cardiovascular;  Laterality: N/A;   PERCUTANEOUS CORONARY STENT INTERVENTION (PCI-S) N/A 12/06/2012   Procedure: PERCUTANEOUS CORONARY STENT INTERVENTION (PCI-S);  Surgeon: Lonni JONETTA Cash, MD;  Location: [facility] CATH LAB;  Service: Cardiovascular;  Laterality: N/A;   Current Facility-Administered Medications  Medication Dose Route Frequency Provider Last Rate Last Admin   acetaminophen  (TYLENOL ) tablet 650 mg  650 mg Oral Q6H PRN Dianna Specking, MD   650 mg at 03/27/24 1107   Or   acetaminophen  (TYLENOL ) suppository 650 mg  650 mg Rectal Q6H PRN Dianna Specking, MD       albuterol  (PROVENTIL ) (2.5 MG/3ML) 0.083% nebulizer solution 2.5 mg  2.5 mg Nebulization Once PRN Singh, Prashant K, MD       sucralfate  (CARAFATE ) 1 GM/10ML suspension 1 g  1 g Oral TID PRN Raenelle Donalda HERO, MD   1 g at 03/29/24 2122   And   lidocaine  (XYLOCAINE ) 2 % viscous mouth solution 15 mL  15 mL Mouth/Throat TID PRN Raenelle Donalda HERO, MD   15 mL at 03/29/24 2122   And  alum & mag hydroxide-simeth (MAALOX/MYLANTA) 200-200-20 MG/5ML suspension 15 mL  15 mL Oral TID PRN Raenelle Donalda HERO, MD   15 mL at 03/29/24 2122   cholecalciferol  (VITAMIN D3) 25 MCG (1000 UNIT) tablet 1,000 Units  1,000 Units Oral Daily Dianna Specking, MD   1,000 Units at 04/01/24 1145   dicyclomine  (BENTYL ) tablet 40 mg  40 mg Oral q morning Dianna Specking, MD   40 mg at 04/01/24 1145   And   dicyclomine  (BENTYL ) tablet 20 mg  20 mg Oral QHS Dianna Specking, MD   20 mg at 03/31/24  1902   diphenhydrAMINE  (BENADRYL ) injection 50 mg  50 mg Intravenous Once PRN Singh, Prashant K, MD       enoxaparin (LOVENOX) injection 50 mg  50 mg Subcutaneous Q24H Raenelle Donalda HERO, MD   50 mg at 03/31/24 2105   EPINEPHrine (ADRENALIN) 0.3 mg  0.3 mg Intramuscular Q5 min PRN Singh, Prashant K, MD       famotidine  (PEPCID ) IVPB 20 mg premix  20 mg Intravenous Once PRN Singh, Prashant K, MD       fluticasone  (FLONASE ) 50 MCG/ACT nasal spray 1 spray  1 spray Each Nare Daily Dianna Specking, MD   1 spray at 04/01/24 1146   gabapentin  (NEURONTIN ) capsule 300 mg  300 mg Oral q morning Dianna Specking, MD   300 mg at 04/01/24 1145   And   gabapentin  (NEURONTIN ) capsule 600 mg  600 mg Oral QHS Dianna Specking, MD   600 mg at 03/31/24 2105   guaiFENesin  (ROBITUSSIN) 100 MG/5ML liquid 5 mL  5 mL Oral Q4H PRN Dianna Specking, MD       hydrALAZINE  (APRESOLINE ) injection 10 mg  10 mg Intravenous Q4H PRN Dianna Specking, MD       ipratropium-albuterol  (DUONEB) 0.5-2.5 (3) MG/3ML nebulizer solution 3 mL  3 mL Nebulization Q4H PRN Dianna Specking, MD       methylPREDNISolone  sodium succinate (SOLU-MEDROL ) 125 mg/2 mL injection 125 mg  125 mg Intravenous Once PRN Singh, Prashant K, MD       methylPREDNISolone  sodium succinate (SOLU-MEDROL ) 125 mg/2 mL injection 60 mg  60 mg Intravenous Q12H Pham, Minh Q, RPH-CPP   60 mg at 04/01/24 1144   metoprolol  tartrate (LOPRESSOR ) injection 5 mg  5 mg Intravenous Q4H PRN Dianna Specking, MD       naphazoline-glycerin (CLEAR EYES REDNESS) ophth solution 1-2 drop  1-2 drop Both Eyes QID PRN Ghimire, Donalda HERO, MD       ondansetron  (ZOFRAN ) tablet 4 mg  4 mg Oral Q6H PRN Dianna Specking, MD       Or   ondansetron  (ZOFRAN ) injection 4 mg  4 mg Intravenous Q6H PRN Dianna Specking, MD   4 mg at 03/28/24 0114   pantoprazole  (PROTONIX ) EC tablet 40 mg  40 mg Oral QAC breakfast Dianna Specking, MD   40 mg at 04/01/24 1145   [START ON 04/02/2024] predniSONE   (DELTASONE ) tablet 40 mg  40 mg Oral Q breakfast Pham, Minh Q, RPH-CPP       Followed by   NOREEN ON 04/09/2024] predniSONE  (DELTASONE ) tablet 30 mg  30 mg Oral Q breakfast Pham, Minh Q, RPH-CPP       Followed by   NOREEN ON 04/16/2024] predniSONE  (DELTASONE ) tablet 20 mg  20 mg Oral Q breakfast Pham, Minh Q, RPH-CPP       Followed by   NOREEN ON 04/30/2024] predniSONE  (DELTASONE ) tablet 10 mg  10 mg Oral Q  breakfast Pham, Minh Q, RPH-CPP       senna-docusate (Senokot-S) tablet 1 tablet  1 tablet Oral QHS PRN Dianna Specking, MD       sodium chloride  (OCEAN) 0.65 % nasal spray 1 spray  1 spray Each Nare PRN Dianna Specking, MD   1 spray at 04/01/24 1146   sodium chloride  0.9 % bolus 1,000 mL  1,000 mL Intravenous Once PRN Singh, Prashant K, MD       sodium chloride  flush (NS) 0.9 % injection 3 mL  3 mL Intravenous Q12H Dianna Specking, MD   3 mL at 04/01/24 1147   Allergies as of 03/26/2024 - Review Complete 03/26/2024  Allergen Reaction Noted   Crestor [rosuvastatin calcium ] Other (See Comments) 06/29/2017   Latex Hives and Rash 11/27/2009   Review of Systems:  Review of Systems  Gastrointestinal:  Positive for diarrhea. Negative for abdominal pain, nausea and vomiting.    OBJECTIVE:   Temp:  [97.7 F (36.5 C)-98.4 F (36.9 C)] 97.7 F (36.5 C) (06/30 0810) Pulse Rate:  [55-57] 57 (06/30 0810) Resp:  [16-20] 20 (06/30 0810) BP: (133-159)/(78-86) 144/86 (06/30 0810) SpO2:  [100 %] 100 % (06/30 0810) Last BM Date : 03/31/24 Physical Exam Constitutional:      General: He is not in acute distress.    Appearance: He is not ill-appearing, toxic-appearing or diaphoretic.   Cardiovascular:     Rate and Rhythm: Normal rate and regular rhythm.  Pulmonary:     Effort: No respiratory distress.     Breath sounds: Normal breath sounds.  Abdominal:     General: There is no distension.     Palpations: Abdomen is soft.     Tenderness: There is no abdominal tenderness. There is no  guarding.   Neurological:     Mental Status: He is alert.     Labs: Recent Labs    03/30/24 0742 03/31/24 0815 04/01/24 0605  WBC 7.2 8.3 9.4  HGB 11.6* 11.1* 11.5*  HCT 37.5* 35.6* 36.7*  PLT 272 242 255   BMET Recent Labs    03/30/24 0742 03/31/24 0815 04/01/24 0605  NA 141 138 137  K 4.2 4.9 4.4  CL 107 105 103  CO2 26 26 28   GLUCOSE 150* 138* 132*  BUN <5* 8 14  CREATININE 1.04 1.13 1.15  CALCIUM  8.5* 8.4* 8.5*   LFT No results for input(s): PROT, ALBUMIN, AST, ALT, ALKPHOS, BILITOT, BILIDIR, IBILI in the last 72 hours. PT/INR No results for input(s): LABPROT, INR in the last 72 hours. Diagnostic imaging: No results found.  IMPRESSION: Pan-colonic ulcerative colitis             -Incomplete response to 5-ASA             -Possible primary non-response to adalimumab in outpatient setting             -Infliximab initiated on 03/31/2024 Coronary artery disease Hypertension Hyperlipidemia   PLAN: - Change to oral steroids tomorrow if symptoms continue to improve. Taper: prednisone  40 mg x 7 days, then prednisone  30 mg x 7 days, then prednisone  20 mg x 2 weeks, then prednisone  10 mg x 2 weeks, further taper instructions pending clinical course and follow-up in office  - My office will contact patient on discharge to arrange his next dose of IFX - Ok for diet as tolerated  - [facility] will follow   LOS: 6 days   Estefana Keas, [facility]

## 2024-04-02 ENCOUNTER — Inpatient Hospital Stay (HOSPITAL_COMMUNITY)

## 2024-04-02 DIAGNOSIS — K529 Noninfective gastroenteritis and colitis, unspecified: Secondary | ICD-10-CM | POA: Diagnosis not present

## 2024-04-02 LAB — CBC
HCT: 35 % — ABNORMAL LOW (ref 39.0–52.0)
Hemoglobin: 11 g/dL — ABNORMAL LOW (ref 13.0–17.0)
MCH: 27 pg (ref 26.0–34.0)
MCHC: 31.4 g/dL (ref 30.0–36.0)
MCV: 85.8 fL (ref 80.0–100.0)
Platelets: 214 10*3/uL (ref 150–400)
RBC: 4.08 MIL/uL — ABNORMAL LOW (ref 4.22–5.81)
RDW: 16.8 % — ABNORMAL HIGH (ref 11.5–15.5)
WBC: 7.7 10*3/uL (ref 4.0–10.5)
nRBC: 0 % (ref 0.0–0.2)

## 2024-04-02 LAB — BASIC METABOLIC PANEL WITH GFR
Anion gap: 5 (ref 5–15)
BUN: 14 mg/dL (ref 8–23)
CO2: 27 mmol/L (ref 22–32)
Calcium: 8.3 mg/dL — ABNORMAL LOW (ref 8.9–10.3)
Chloride: 105 mmol/L (ref 98–111)
Creatinine, Ser: 1.08 mg/dL (ref 0.61–1.24)
GFR, Estimated: >60 mL/min (ref >60)
Glucose, Bld: 151 mg/dL — ABNORMAL HIGH (ref 70–99)
Potassium: 4.1 mmol/L (ref 3.5–5.1)
Sodium: 137 mmol/L (ref 135–145)

## 2024-04-02 LAB — MAGNESIUM: Magnesium: 2 mg/dL (ref 1.7–2.4)

## 2024-04-02 NOTE — Progress Notes (Signed)
 [facility] Progress Note  SUBJECTIVE:   Interval history: Russell Padilla was seen and evaluated today at bedside. Resting in bed. Had non-bloody, semi-formed bowel movement since last evaluation. No chest pain or shortness of breath.   Past Medical History:  Diagnosis Date   Abdominal pain 07/11/2013   ongoing and unspecified   Arthritis    Bladder stone 07/11/2013   surgery planned   CAD (coronary artery disease)    a. 12/2012 Cath/PCI: DES to superior branch of OM2;  b. Repeat cath 4/14 with 20% LAD, patent stent OM3, 70% diffuse stenosis small, non-dominant RCA.   Essential hypertension    Headache    HLD (hyperlipidemia)    Renal disorder    renal calculi   Stomach pain    chronic   Past Surgical History:  Procedure Laterality Date   BACK SURGERY     2 cervical/ 2 lumbar fusions   BIOPSY OF SKIN SUBCUTANEOUS TISSUE AND/OR MUCOUS MEMBRANE  03/28/2024   Procedure: BIOPSY, SKIN, SUBCUTANEOUS TISSUE, OR MUCOUS MEMBRANE;  Surgeon: Dianna Specking, MD;  Location: [facility] ENDOSCOPY;  Service: Gastroenterology;;   CARDIAC CATHETERIZATION N/A 03/04/2015   Procedure: Left Heart Cath and Coronary Angiography;  Surgeon: Lonni JONETTA Cash, MD;  Location: [facility] INVASIVE CV LAB;  Service: Cardiovascular;  Laterality: N/A;   CHOLECYSTECTOMY     laparoscopic.   COLONOSCOPY N/A 03/28/2024   Procedure: COLONOSCOPY;  Surgeon: Dianna Specking, MD;  Location: [facility] ENDOSCOPY;  Service: Gastroenterology;  Laterality: N/A;   CORONARY STENT PLACEMENT     3'14   ESOPHAGOGASTRODUODENOSCOPY ENDOSCOPY     multiple times-[facility]/ and now being followed by Endo- [facility]   KNEE ARTHROSCOPY Left    '80's   LEFT HEART CATH AND CORONARY ANGIOGRAPHY N/A 02/16/2024   Procedure: LEFT HEART CATH AND CORONARY ANGIOGRAPHY;  Surgeon: Cash Lonni JONETTA, MD;  Location: [facility] INVASIVE CV LAB;  Service: Cardiovascular;  Laterality: N/A;   LEFT HEART CATHETERIZATION WITH CORONARY ANGIOGRAM N/A 12/05/2012    Procedure: LEFT HEART CATHETERIZATION WITH CORONARY ANGIOGRAM;  Surgeon: Salena GORMAN Negri, MD;  Location: [facility] CATH LAB;  Service: Cardiovascular;  Laterality: N/A;   LEFT HEART CATHETERIZATION WITH CORONARY ANGIOGRAM N/A 01/17/2013   Procedure: LEFT HEART CATHETERIZATION WITH CORONARY ANGIOGRAM;  Surgeon: Deatrice DELENA Cage, MD;  Location: [facility] CATH LAB;  Service: Cardiovascular;  Laterality: N/A;   PERCUTANEOUS CORONARY STENT INTERVENTION (PCI-S) N/A 12/06/2012   Procedure: PERCUTANEOUS CORONARY STENT INTERVENTION (PCI-S);  Surgeon: Lonni JONETTA Cash, MD;  Location: [facility] CATH LAB;  Service: Cardiovascular;  Laterality: N/A;   Current Facility-Administered Medications  Medication Dose Route Frequency Provider Last Rate Last Admin   acetaminophen  (TYLENOL ) tablet 650 mg  650 mg Oral Q6H PRN Dianna Specking, MD   650 mg at 04/01/24 2335   Or   acetaminophen  (TYLENOL ) suppository 650 mg  650 mg Rectal Q6H PRN Dianna Specking, MD       albuterol  (PROVENTIL ) (2.5 MG/3ML) 0.083% nebulizer solution 2.5 mg  2.5 mg Nebulization Once PRN Singh, Prashant K, MD       sucralfate  (CARAFATE ) 1 GM/10ML suspension 1 g  1 g Oral TID PRN Raenelle Donalda HERO, MD   1 g at 03/29/24 2122   And   lidocaine  (XYLOCAINE ) 2 % viscous mouth solution 15 mL  15 mL Mouth/Throat TID PRN Raenelle Donalda HERO, MD   15 mL at 03/29/24 2122   And   alum & mag hydroxide-simeth (MAALOX/MYLANTA) 200-200-20 MG/5ML suspension 15 mL  15 mL Oral TID  PRN Ghimire, Shanker M, MD   15 mL at 03/29/24 2122   cholecalciferol  (VITAMIN D3) 25 MCG (1000 UNIT) tablet 1,000 Units  1,000 Units Oral Daily Dianna Specking, MD   1,000 Units at 04/01/24 1145   dicyclomine  (BENTYL ) tablet 40 mg  40 mg Oral q morning Dianna Specking, MD   40 mg at 04/01/24 1145   And   dicyclomine  (BENTYL ) tablet 20 mg  20 mg Oral QHS Dianna Specking, MD   20 mg at 04/01/24 1837   diphenhydrAMINE  (BENADRYL ) injection 50 mg  50 mg Intravenous Once PRN Singh, Prashant K, MD        enoxaparin (LOVENOX) injection 50 mg  50 mg Subcutaneous Q24H Raenelle Donalda HERO, MD   50 mg at 04/01/24 2144   EPINEPHrine (ADRENALIN) 0.3 mg  0.3 mg Intramuscular Q5 min PRN Singh, Prashant K, MD       famotidine  (PEPCID ) IVPB 20 mg premix  20 mg Intravenous Once PRN Singh, Prashant K, MD       fluticasone  (FLONASE ) 50 MCG/ACT nasal spray 1 spray  1 spray Each Nare Daily Dianna Specking, MD   1 spray at 04/01/24 1146   gabapentin  (NEURONTIN ) capsule 300 mg  300 mg Oral q morning Dianna Specking, MD   300 mg at 04/01/24 1145   And   gabapentin  (NEURONTIN ) capsule 600 mg  600 mg Oral QHS Dianna Specking, MD   600 mg at 04/01/24 2144   guaiFENesin  (ROBITUSSIN) 100 MG/5ML liquid 5 mL  5 mL Oral Q4H PRN Dianna Specking, MD       hydrALAZINE  (APRESOLINE ) injection 10 mg  10 mg Intravenous Q4H PRN Dianna Specking, MD       ipratropium-albuterol  (DUONEB) 0.5-2.5 (3) MG/3ML nebulizer solution 3 mL  3 mL Nebulization Q4H PRN Dianna Specking, MD       irbesartan  (AVAPRO ) tablet 37.5 mg  37.5 mg Oral Daily Shona Laurence N, DO   37.5 mg at 04/01/24 2336   methylPREDNISolone  sodium succinate (SOLU-MEDROL ) 125 mg/2 mL injection 125 mg  125 mg Intravenous Once PRN Singh, Prashant K, MD       metoprolol  tartrate (LOPRESSOR ) injection 5 mg  5 mg Intravenous Q4H PRN Dianna Specking, MD       naphazoline-glycerin (CLEAR EYES REDNESS) ophth solution 1-2 drop  1-2 drop Both Eyes QID PRN Ghimire, Donalda HERO, MD       ondansetron  (ZOFRAN ) tablet 4 mg  4 mg Oral Q6H PRN Dianna Specking, MD       Or   ondansetron  (ZOFRAN ) injection 4 mg  4 mg Intravenous Q6H PRN Dianna Specking, MD   4 mg at 03/28/24 0114   pantoprazole  (PROTONIX ) EC tablet 40 mg  40 mg Oral QAC breakfast Dianna Specking, MD   40 mg at 04/01/24 1145   predniSONE  (DELTASONE ) tablet 40 mg  40 mg Oral Q breakfast Pham, Minh Q, RPH-CPP       Followed by   NOREEN ON 04/09/2024] predniSONE  (DELTASONE ) tablet 30 mg  30 mg Oral Q breakfast  Pham, Minh Q, RPH-CPP       Followed by   NOREEN ON 04/16/2024] predniSONE  (DELTASONE ) tablet 20 mg  20 mg Oral Q breakfast Pham, Minh Q, RPH-CPP       Followed by   NOREEN ON 04/30/2024] predniSONE  (DELTASONE ) tablet 10 mg  10 mg Oral Q breakfast Pham, Minh Q, RPH-CPP       senna-docusate (Senokot-S) tablet 1 tablet  1 tablet Oral QHS PRN Dianna,  Jerrell, MD       sodium chloride  (OCEAN) 0.65 % nasal spray 1 spray  1 spray Each Nare PRN Dianna Jerrell, MD   1 spray at 04/01/24 1146   sodium chloride  0.9 % bolus 1,000 mL  1,000 mL Intravenous Once PRN Singh, Prashant K, MD       sodium chloride  flush (NS) 0.9 % injection 3 mL  3 mL Intravenous Q12H Dianna Jerrell, MD   3 mL at 04/01/24 2151   Allergies as of 03/26/2024 - Review Complete 03/26/2024  Allergen Reaction Noted   Crestor [rosuvastatin calcium ] Other (See Comments) 06/29/2017   Latex Hives and Rash 11/27/2009   Review of Systems:  Review of Systems  Respiratory:  Negative for shortness of breath.   Cardiovascular:  Negative for chest pain.  Gastrointestinal:  Negative for constipation and diarrhea.    OBJECTIVE:   Temp:  [97.7 F (36.5 C)-97.8 F (36.6 C)] 97.7 F (36.5 C) (07/01 0815) Pulse Rate:  [54-58] 57 (07/01 0815) Resp:  [16-18] 18 (07/01 0440) BP: (140-162)/(74-86) 140/74 (07/01 0815) SpO2:  [94 %-95 %] 94 % (07/01 0815) Last BM Date : 03/31/24 Physical Exam Constitutional:      General: He is not in acute distress.    Appearance: He is not ill-appearing, toxic-appearing or diaphoretic.   Cardiovascular:     Rate and Rhythm: Normal rate and regular rhythm.  Pulmonary:     Effort: No respiratory distress.     Breath sounds: Normal breath sounds.  Abdominal:     General: There is no distension.     Palpations: Abdomen is soft.     Tenderness: There is no abdominal tenderness. There is no guarding.   Neurological:     Mental Status: He is alert.     Labs: Recent Labs    03/31/24 0815  04/01/24 0605 04/02/24 0534  WBC 8.3 9.4 7.7  HGB 11.1* 11.5* 11.0*  HCT 35.6* 36.7* 35.0*  PLT 242 255 214   BMET Recent Labs    03/31/24 0815 04/01/24 0605 04/02/24 0534  NA 138 137 137  K 4.9 4.4 4.1  CL 105 103 105  CO2 26 28 27   GLUCOSE 138* 132* 151*  BUN 8 14 14   CREATININE 1.13 1.15 1.08  CALCIUM  8.4* 8.5* 8.3*   LFT No results for input(s): PROT, ALBUMIN, AST, ALT, ALKPHOS, BILITOT, BILIDIR, IBILI in the last 72 hours. PT/INR No results for input(s): LABPROT, INR in the last 72 hours. Diagnostic imaging: No results found.  IMPRESSION: Pan-colonic ulcerative colitis             -Incomplete response to 5-ASA             -Possible primary non-response to adalimumab in outpatient setting             -Infliximab initiated on 03/31/2024 Coronary artery disease Hypertension Hyperlipidemia   PLAN: - Ok for discharge from GI perspective today - Low fiber diet on discharge - No mesalamine on discharge - No cholestyramine on discharge - Ok for IV steroids until discharge (per patient request) - Change to oral steroids on discharge. Taper: prednisone  40 mg x 7 days, then prednisone  30 mg x 7 days, then prednisone  20 mg x 2 weeks, then prednisone  10 mg x 2 weeks, further taper instructions pending clinical course and follow-up in office  - My office will arrange next induction dose of IFX - [facility] will sign off   LOS: 7 days   Estefana  [facility]

## 2024-04-02 NOTE — Consult Note (Signed)
 Urology Consult Note   Requesting Attending Physician:  Dennise Lavada POUR, MD Service Providing Consult: Urology  Consulting Attending: Dr. Carolee   Reason for Consult: Reported hematuria  HPI: Russell Padilla is seen in consultation for reasons noted above at the request of Dennise Lavada POUR, MD. Patient is a 68 year old male admitted for treatment of irritable bowel disease.  He was preparing to discharge home today and urology was consulted to speak to reported hematuria.  Patient is remotely known to our practice and was followed by Dr. Nieves up until about 2014, when patient's previous urologist established her practice in [place] and Dr. Nicholaus has been handling his care since.  On my arrival patient's urine was crystal-clear.  No evidence of hematuria new or old.  He reported passing what may have been a small bit of blood yesterday.  His primary concern was leaving the hospital and having to come back for something else because he had been here for much longer than he wanted to be here.  He felt that if he developed some kind of urologic problem that he would not be able to get an urgent work and over the holiday so the expectation was we rule out potential problems.  ------------------  Assessment:   68 y.o. male with reported hematuria   Recommendations: #vague groin for a short time yesterday #brief hematuria reported yesterday  No evidence of hematuria. CT stone collected, no hydronephrosis, no obstructive uropathy, no evidence of ureteral stone or recent passage of one. I am not sure what patient witnessed but it seems unlikely that it was hematuria based on the appearance of his urine and his imaging.  Hematuria is an uncommon but known side effect of the immunologic's he has been taking.  Perhaps he experienced this transiently while transitioning to his new medication. No urologic problems identified.  Patient will follow-up with his usual urologist.  Will not need to  follow.  Case and plan discussed with Dr. Carolee  Past Medical History: Past Medical History:  Diagnosis Date   Abdominal pain 07/11/2013   ongoing and unspecified   Arthritis    Bladder stone 07/11/2013   surgery planned   CAD (coronary artery disease)    a. 12/2012 Cath/PCI: DES to superior branch of OM2;  b. Repeat cath 4/14 with 20% LAD, patent stent OM3, 70% diffuse stenosis small, non-dominant RCA.   Essential hypertension    Headache    HLD (hyperlipidemia)    Renal disorder    renal calculi   Stomach pain    chronic    Past Surgical History:  Past Surgical History:  Procedure Laterality Date   BACK SURGERY     2 cervical/ 2 lumbar fusions   BIOPSY OF SKIN SUBCUTANEOUS TISSUE AND/OR MUCOUS MEMBRANE  03/28/2024   Procedure: BIOPSY, SKIN, SUBCUTANEOUS TISSUE, OR MUCOUS MEMBRANE;  Surgeon: Dianna Specking, MD;  Location: [facility] ENDOSCOPY;  Service: Gastroenterology;;   CARDIAC CATHETERIZATION N/A 03/04/2015   Procedure: Left Heart Cath and Coronary Angiography;  Surgeon: Lonni JONETTA Cash, MD;  Location: [facility] INVASIVE CV LAB;  Service: Cardiovascular;  Laterality: N/A;   CHOLECYSTECTOMY     laparoscopic.   COLONOSCOPY N/A 03/28/2024   Procedure: COLONOSCOPY;  Surgeon: Dianna Specking, MD;  Location: [facility] ENDOSCOPY;  Service: Gastroenterology;  Laterality: N/A;   CORONARY STENT PLACEMENT     3'14   ESOPHAGOGASTRODUODENOSCOPY ENDOSCOPY     multiple times-[place]/ and now being followed by Endo- [facility]   KNEE ARTHROSCOPY Left    '  80's   LEFT HEART CATH AND CORONARY ANGIOGRAPHY N/A 02/16/2024   Procedure: LEFT HEART CATH AND CORONARY ANGIOGRAPHY;  Surgeon: Verlin Lonni BIRCH, MD;  Location: [facility] INVASIVE CV LAB;  Service: Cardiovascular;  Laterality: N/A;   LEFT HEART CATHETERIZATION WITH CORONARY ANGIOGRAM N/A 12/05/2012   Procedure: LEFT HEART CATHETERIZATION WITH CORONARY ANGIOGRAM;  Surgeon: Salena GORMAN Negri, MD;  Location: [facility] CATH LAB;  Service: Cardiovascular;   Laterality: N/A;   LEFT HEART CATHETERIZATION WITH CORONARY ANGIOGRAM N/A 01/17/2013   Procedure: LEFT HEART CATHETERIZATION WITH CORONARY ANGIOGRAM;  Surgeon: Deatrice DELENA Cage, MD;  Location: [facility] CATH LAB;  Service: Cardiovascular;  Laterality: N/A;   PERCUTANEOUS CORONARY STENT INTERVENTION (PCI-S) N/A 12/06/2012   Procedure: PERCUTANEOUS CORONARY STENT INTERVENTION (PCI-S);  Surgeon: Lonni BIRCH Verlin, MD;  Location: [facility] CATH LAB;  Service: Cardiovascular;  Laterality: N/A;    Medication: Current Facility-Administered Medications  Medication Dose Route Frequency Provider Last Rate Last Admin   acetaminophen  (TYLENOL ) tablet 650 mg  650 mg Oral Q6H PRN Dianna Specking, MD   650 mg at 04/01/24 2335   Or   acetaminophen  (TYLENOL ) suppository 650 mg  650 mg Rectal Q6H PRN Dianna Specking, MD       albuterol  (PROVENTIL ) (2.5 MG/3ML) 0.083% nebulizer solution 2.5 mg  2.5 mg Nebulization Once PRN Singh, Prashant K, MD       sucralfate  (CARAFATE ) 1 GM/10ML suspension 1 g  1 g Oral TID PRN Raenelle Donalda HERO, MD   1 g at 03/29/24 2122   And   lidocaine  (XYLOCAINE ) 2 % viscous mouth solution 15 mL  15 mL Mouth/Throat TID PRN Raenelle Donalda HERO, MD   15 mL at 03/29/24 2122   And   alum & mag hydroxide-simeth (MAALOX/MYLANTA) 200-200-20 MG/5ML suspension 15 mL  15 mL Oral TID PRN Raenelle Donalda HERO, MD   15 mL at 03/29/24 2122   cholecalciferol  (VITAMIN D3) 25 MCG (1000 UNIT) tablet 1,000 Units  1,000 Units Oral Daily Dianna Specking, MD   1,000 Units at 04/02/24 9078   dicyclomine  (BENTYL ) tablet 40 mg  40 mg Oral q morning Dianna Specking, MD   40 mg at 04/02/24 9078   And   dicyclomine  (BENTYL ) tablet 20 mg  20 mg Oral QHS Dianna Specking, MD   20 mg at 04/01/24 1837   diphenhydrAMINE  (BENADRYL ) injection 50 mg  50 mg Intravenous Once PRN Singh, Prashant K, MD       enoxaparin (LOVENOX) injection 50 mg  50 mg Subcutaneous Q24H Ghimire, Shanker M, MD   50 mg at 04/01/24 2144   EPINEPHrine  (ADRENALIN) 0.3 mg  0.3 mg Intramuscular Q5 min PRN Singh, Prashant K, MD       famotidine  (PEPCID ) IVPB 20 mg premix  20 mg Intravenous Once PRN Singh, Prashant K, MD       fluticasone  (FLONASE ) 50 MCG/ACT nasal spray 1 spray  1 spray Each Nare Daily Dianna Specking, MD   1 spray at 04/02/24 9076   gabapentin  (NEURONTIN ) capsule 300 mg  300 mg Oral q morning Dianna Specking, MD   300 mg at 04/02/24 9079   And   gabapentin  (NEURONTIN ) capsule 600 mg  600 mg Oral QHS Dianna Specking, MD   600 mg at 04/01/24 2144   guaiFENesin  (ROBITUSSIN) 100 MG/5ML liquid 5 mL  5 mL Oral Q4H PRN Dianna Specking, MD       hydrALAZINE  (APRESOLINE ) injection 10 mg  10 mg Intravenous Q4H PRN Dianna Specking, MD  ipratropium-albuterol  (DUONEB) 0.5-2.5 (3) MG/3ML nebulizer solution 3 mL  3 mL Nebulization Q4H PRN Dianna Specking, MD       irbesartan  (AVAPRO ) tablet 37.5 mg  37.5 mg Oral Daily Shona Laurence N, DO   37.5 mg at 04/02/24 9078   methylPREDNISolone  sodium succinate (SOLU-MEDROL ) 125 mg/2 mL injection 125 mg  125 mg Intravenous Once PRN Singh, Prashant K, MD       metoprolol  tartrate (LOPRESSOR ) injection 5 mg  5 mg Intravenous Q4H PRN Dianna Specking, MD       naphazoline-glycerin (CLEAR EYES REDNESS) ophth solution 1-2 drop  1-2 drop Both Eyes QID PRN Ghimire, Donalda HERO, MD       ondansetron  (ZOFRAN ) tablet 4 mg  4 mg Oral Q6H PRN Dianna Specking, MD       Or   ondansetron  (ZOFRAN ) injection 4 mg  4 mg Intravenous Q6H PRN Dianna Specking, MD   4 mg at 03/28/24 0114   pantoprazole  (PROTONIX ) EC tablet 40 mg  40 mg Oral QAC breakfast Dianna Specking, MD   40 mg at 04/02/24 9078   predniSONE  (DELTASONE ) tablet 40 mg  40 mg Oral Q breakfast Pham, Minh Q, RPH-CPP   40 mg at 04/02/24 0920   Followed by   NOREEN ON 04/09/2024] predniSONE  (DELTASONE ) tablet 30 mg  30 mg Oral Q breakfast Pham, Minh Q, RPH-CPP       Followed by   NOREEN ON 04/16/2024] predniSONE  (DELTASONE ) tablet 20 mg  20 mg  Oral Q breakfast Pham, Minh Q, RPH-CPP       Followed by   NOREEN ON 04/30/2024] predniSONE  (DELTASONE ) tablet 10 mg  10 mg Oral Q breakfast Pham, Minh Q, RPH-CPP       senna-docusate (Senokot-S) tablet 1 tablet  1 tablet Oral QHS PRN Dianna Specking, MD       sodium chloride  (OCEAN) 0.65 % nasal spray 1 spray  1 spray Each Nare PRN Dianna Specking, MD   1 spray at 04/01/24 1146   sodium chloride  0.9 % bolus 1,000 mL  1,000 mL Intravenous Once PRN Singh, Prashant K, MD       sodium chloride  flush (NS) 0.9 % injection 3 mL  3 mL Intravenous Q12H Dianna Specking, MD   3 mL at 04/01/24 2151    Allergies: Allergies  Allergen Reactions   Crestor [Rosuvastatin Calcium ] Other (See Comments)    Aches    Latex Hives and Rash    When at dentist    Social History: Social History   Tobacco Use   Smoking status: Former    Current packs/day: 0.00    Average packs/day: 0.5 packs/day for 30.0 years (15.0 ttl pk-yrs)    Types: Cigarettes    Start date: 12/05/1982    Quit date: 12/04/2012    Years since quitting: 11.3   Smokeless tobacco: Never  Vaping Use   Vaping status: Never Used  Substance Use Topics   Alcohol use: No    Alcohol/week: 0.0 standard drinks of alcohol   Drug use: No    Family History Family History  Problem Relation Age of Onset   Stomach cancer Mother    Congestive Heart Failure Father    Dementia Father    Heart disease Brother     Review of Systems  Genitourinary:  Negative for dysuria, flank pain, frequency, hematuria and urgency.     Objective   Vital signs in last 24 hours: BP (!) 140/74 (BP Location: Right Arm)   Pulse ROLLEN)  57   Temp (!) 97.5 F (36.4 C) (Oral)   Resp 18   Ht 5' 10 (1.778 m)   Wt 109.3 kg   SpO2 97%   BMI 34.58 kg/m   Physical Exam General: A&O, resting, appropriate HEENT: [place]/AT Pulmonary: Normal work of breathing Cardiovascular: no cyanosis Abdomen: Soft, NTTP, nondistended Neuro: Appropriate, no focal neurological  deficits  Most Recent Labs: Lab Results  Component Value Date   WBC 7.7 04/02/2024   HGB 11.0 (L) 04/02/2024   HCT 35.0 (L) 04/02/2024   PLT 214 04/02/2024    Lab Results  Component Value Date   NA 137 04/02/2024   K 4.1 04/02/2024   CL 105 04/02/2024   CO2 27 04/02/2024   BUN 14 04/02/2024   CREATININE 1.08 04/02/2024   CALCIUM  8.3 (L) 04/02/2024   MG 2.0 04/02/2024    Lab Results  Component Value Date   INR 1.1 03/26/2024   APTT 33.9 (H) 05/18/2009     Urine Culture: @[id number] (laburin,org,[id number],[id number])@   IMAGING: CT RENAL STONE STUDY Result Date: 04/02/2024 CLINICAL DATA:  Abdominal/-flank pain EXAM: CT ABDOMEN AND PELVIS WITHOUT CONTRAST TECHNIQUE: Multidetector CT imaging of the abdomen and pelvis was performed following the standard protocol without IV contrast. RADIATION DOSE REDUCTION: This exam was performed according to the departmental dose-optimization program which includes automated exposure control, adjustment of the mA and/or kV according to patient size and/or use of iterative reconstruction technique. COMPARISON:  03/26/2024. FINDINGS: Lower chest: Mild linear atelectasis/scarring at the bases. Trace right pleural effusion. No acute findings. Hepatobiliary: Small area of focal fatty infiltration adjacent to the falciform ligament. Liver normal in size and otherwise normal in attenuation. Status post cholecystectomy. Intrahepatic biliary air, stable from the prior CT. No bile duct dilation. Pancreas: Unremarkable. No pancreatic ductal dilatation or surrounding inflammatory changes. Spleen: Normal in size without focal abnormality. Adrenals/Urinary Tract: Normal adrenal glands. Kidneys normal in overall size and position. There are bilateral nonobstructing intrarenal stones. No renal masses. No hydronephrosis. Normal ureters. Bladder mildly distended. Dependent 7 mm bladder stone is stable from the prior CT. No bladder wall thickening or mass. Stomach/Bowel: Normal  stomach. Small bowel and colon normal in caliber. No wall thickening or inflammation. Small scattered sigmoid colon diverticula. Normal appendix visualized. Vascular/Lymphatic: Aortic atherosclerosis. No aneurysm. No enlarged lymph nodes. Reproductive: Unremarkable. Other: No ascites. Musculoskeletal: No fracture or acute finding. No bone lesion. Stable changes from a previous posterior lumbar spine fusion. IMPRESSION: 1. No acute findings.  No ureteral stone or obstructive uropathy. 2. Multiple bilateral nonobstructing intrarenal stones, stable. 7 mm dependent bladder stone, also unchanged from the prior CT. 3. Aortic atherosclerosis. Electronically Signed   By: Alm Parkins M.D.   On: 04/02/2024 10:57    ------  Ole Bourdon, NP Pager: [phone]   Please contact the urology consult pager with any further questions/concerns.

## 2024-04-02 NOTE — Progress Notes (Signed)
 PROGRESS NOTE        PATIENT DETAILS Name: Russell Padilla Age: 68 y.o. Sex: male Date of Birth: 1956-03-05 Admit Date: 03/26/2024 Admitting Physician Deliliah Room, MD ERE:[id number], Alm, MD  Brief Summary: Patient is a 68 y.o.  male with history of ulcerative colitis on Humira/mesalamine who presented with worsening of diarrhea.  He was found to have hypotension secondary to hypovolemic shock along with AKI.  He was evaluated by GI and underwent colonoscopy which showed pancolitis.    Significant events: 6/24>> admit to [facility]-worsening diarrhea for the past several months 6/26>> colonoscopy with severe pan colitis-IV steroids started 6/27>> continues to have diarrhea-discussed with GI/pharmacy-infliximab infusion planned for this coming Sunday.  Significant studies: 6/03>> L LE Doppler: No  DVT. 6/24>> CT abdomen/pelvis: Diffuse colitis.  7 mm stone in the urinary bladder. 6/24>> CT head: No acute intracranial malady 6/24>> CXR: No PNA  Significant microbiology data: 6/24>> blood culture: No growth  Procedures: 6/26>> colonoscopy: Severe pancolitis-likely ulcerative colitis-IV Solu-Medrol  started.  Consults: [facility]  Subjective:  Patient in bed denies any headache, no chest or abdominal pain diarrhea has improved, now having significant dysuria and peeing clots.    Objective: Vitals: Blood pressure (!) 140/74, pulse (!) 57, temperature 97.7 F (36.5 C), temperature source Oral, resp. rate 18, height 5' 10 (1.778 m), weight 109.3 kg, SpO2 94%.   Exam:  Awake Alert, No new F.N deficits, Normal affect [place].AT,PERRAL Supple Neck, No JVD,   Symmetrical Chest wall movement, Good air movement bilaterally, CTAB RRR,No Gallops, Rubs or new Murmurs,  +ve B.Sounds, Abd Soft, No tenderness,   No Cyanosis, Clubbing or edema    Assessment/Plan: Worsening diarrhea secondary to severe ulcerative colitis (pancolitis on colonoscopy on 6/26)   Refractory/severe colitis in spite of being on mesalamine and Humira-has had voluminous diarrhea since 2025-underwent colonoscopy which showed pancolitis-subsequently started on IV steroids on 6/26 with significant improvement  He received his first Remicade infusion on 03/31/2024, case discussed with GI, he on prolonged steroid taper.  Stable from GI standpoint for discharge and follow-up with [facility] postdischarge   Hypovolemic shock Secondary to worsening diarrhea  BP now stable after IV fluid resuscitation.  AKI Hemodynamically mediated due to hypotension/diarrhea/ARB use AKI has resolved with IV fluids-under the supportive care-ARB remains on hold.  CAD Recent LHC May 2025-no targets for PCI-medical management recommended. Currently without any anginal symptoms  HTN BP stable All antihypertensives on hold  HLD Repatha  at home  7 mm stone in the urinary bladder Supportive care Monitor-outpatient urology follow-up  Multiple nonobstructing bilateral renal calculi, history of possible urethral stricture Now having significant dysuria and urinating blood clots on 04/02/2024, CT renal stone protocol ordered, urology consulted.  Left leg swelling Per patient-this has been attributed to varicose veins Recent outpatient Doppler negative for DVT Supportive care Compression stockings when able.  Class 1 Obesity Estimated body mass index is 34.58 kg/m as calculated from the following:   Height as of this encounter: 5' 10 (1.778 m).   Weight as of this encounter: 109.3 kg.   Code status:   Code Status: Full Code   DVT Prophylaxis: Place TED hose Start: 03/28/24 1112 SCDs Start: 03/26/24 1820   Family Communication: None at bedside   Disposition Plan: Status is: Inpatient Remains inpatient appropriate because: Severity of illness   Planned Discharge Destination:Home  Diet: Diet Order             DIET SOFT Room service appropriate? Yes; Fluid consistency: Thin   Diet effective now                    MEDICATIONS: Scheduled Meds:  cholecalciferol   1,000 Units Oral Daily   dicyclomine   40 mg Oral q morning   And   dicyclomine   20 mg Oral QHS   enoxaparin (LOVENOX) injection  50 mg Subcutaneous Q24H   fluticasone   1 spray Each Nare Daily   gabapentin   300 mg Oral q morning   And   gabapentin   600 mg Oral QHS   irbesartan   37.5 mg Oral Daily   pantoprazole   40 mg Oral QAC breakfast   predniSONE   40 mg Oral Q breakfast   Followed by   NOREEN ON 04/09/2024] predniSONE   30 mg Oral Q breakfast   Followed by   NOREEN ON 04/16/2024] predniSONE   20 mg Oral Q breakfast   Followed by   NOREEN ON 04/30/2024] predniSONE   10 mg Oral Q breakfast   sodium chloride  flush  3 mL Intravenous Q12H   Continuous Infusions:  famotidine  (PEPCID ) IV     sodium chloride      PRN Meds:.acetaminophen  **OR** acetaminophen , albuterol , sucralfate  **AND** lidocaine  **AND** alum & mag hydroxide-simeth, diphenhydrAMINE , EPINEPHrine, famotidine  (PEPCID ) IV, guaiFENesin , hydrALAZINE , ipratropium-albuterol , methylPREDNISolone  (SOLU-MEDROL ) injection, metoprolol  tartrate, naphazoline-glycerin, ondansetron  **OR** ondansetron  (ZOFRAN ) IV, senna-docusate, sodium chloride , sodium chloride    I have personally reviewed following labs and imaging studies  LABORATORY DATA: CBC: Recent Labs  Lab 03/26/24 1248 03/27/24 0500 03/29/24 0735 03/30/24 0742 03/31/24 0815 04/01/24 0605 04/02/24 0534  WBC 5.7   < > 4.9 7.2 8.3 9.4 7.7  NEUTROABS 3.7  --   --   --   --   --   --   HGB 11.4*   < > 11.7* 11.6* 11.1* 11.5* 11.0*  HCT 36.4*   < > 37.0* 37.5* 35.6* 36.7* 35.0*  MCV 84.8   < > 86.2 86.6 86.0 86.8 85.8  PLT 252   < > 278 272 242 255 214   < > = values in this interval not displayed.    Basic Metabolic Panel: Recent Labs  Lab 03/29/24 0735 03/30/24 0742 03/31/24 0815 04/01/24 0605 04/02/24 0534  NA 136 141 138 137 137  K 4.2 4.2 4.9 4.4 4.1  CL 104 107 105 103  105  CO2 24 26 26 28 27   GLUCOSE 128* 150* 138* 132* 151*  BUN <5* <5* 8 14 14   CREATININE 1.29* 1.04 1.13 1.15 1.08  CALCIUM  8.3* 8.5* 8.4* 8.5* 8.3*  MG 1.9 2.0 2.0 2.0 2.0    GFR: Estimated Creatinine Clearance: 79.9 mL/min (by C-G formula based on SCr of 1.08 mg/dL).  Liver Function Tests: Recent Labs  Lab 03/26/24 1248 03/27/24 0500  AST 19 19  ALT 17 16  ALKPHOS 41 37*  BILITOT 0.7 0.6  PROT 5.0* 4.5*  ALBUMIN 2.1* 1.9*   Recent Labs  Lab 03/26/24 1248  LIPASE 26   No results for input(s): AMMONIA in the last 168 hours.  Coagulation Profile: Recent Labs  Lab 03/26/24 1248  INR 1.1    Cardiac Enzymes: No results for input(s): CKTOTAL, CKMB, CKMBINDEX, TROPONINI in the last 168 hours.  BNP (last 3 results) No results for input(s): PROBNP in the last 8760 hours.  Lipid Profile: No results for input(s): CHOL, HDL, LDLCALC, TRIG, CHOLHDL, LDLDIRECT  in the last 72 hours.  Thyroid Function Tests: No results for input(s): TSH, T4TOTAL, FREET4, T3FREE, THYROIDAB in the last 72 hours.  Anemia Panel: No results for input(s): VITAMINB12, FOLATE, FERRITIN, TIBC, IRON, RETICCTPCT in the last 72 hours.  Urine analysis:    Component Value Date/Time   COLORURINE YELLOW 03/26/2024 1353   APPEARANCEUR HAZY (A) 03/26/2024 1353   LABSPEC 1.005 03/26/2024 1353   PHURINE 6.0 03/26/2024 1353   GLUCOSEU NEGATIVE 03/26/2024 1353   GLUCOSEU NEGATIVE 11/24/2009 1140   HGBUR NEGATIVE 03/26/2024 1353   BILIRUBINUR NEGATIVE 03/26/2024 1353   KETONESUR NEGATIVE 03/26/2024 1353   PROTEINUR NEGATIVE 03/26/2024 1353   UROBILINOGEN 0.2 05/28/2013 2350   NITRITE NEGATIVE 03/26/2024 1353   LEUKOCYTESUR NEGATIVE 03/26/2024 1353    Sepsis Labs: Lactic Acid, Venous    Component Value Date/Time   LATICACIDVEN 0.7 03/26/2024 1452    MICROBIOLOGY: Recent Results (from the past 240 hours)  Blood Culture (routine x 2)     Status:  None   Collection Time: 03/26/24  1:53 PM   Specimen: BLOOD RIGHT ARM  Result Value Ref Range Status   Specimen Description BLOOD RIGHT ARM  Final   Special Requests   Final    BOTTLES DRAWN AEROBIC AND ANAEROBIC Blood Culture results may not be optimal due to an inadequate volume of blood received in culture bottles   Culture   Final    NO GROWTH 5 DAYS Performed at [facility], [address], [place], [place] [address]    Report Status 03/31/2024 FINAL  Final  Blood Culture (routine x 2)     Status: None   Collection Time: 03/26/24  1:58 PM   Specimen: BLOOD RIGHT HAND  Result Value Ref Range Status   Specimen Description BLOOD RIGHT HAND  Final   Special Requests   Final    BOTTLES DRAWN AEROBIC ONLY Blood Culture results may not be optimal due to an inadequate volume of blood received in culture bottles   Culture   Final    NO GROWTH 5 DAYS Performed at [facility], [address], [place], [place] [address]    Report Status 03/31/2024 FINAL  Final    RADIOLOGY STUDIES/RESULTS: No results found.    LOS: 7 days   Lavada Stank, MD  [facility]    To contact the attending provider between 7A-7P or the covering provider during after hours 7P-7A, please log into the web site [email or web address] and access using universal [place] password for that web site. If you do not have the password, please call the hospital operator.  04/02/2024, 10:04 AM

## 2024-04-02 NOTE — TOC Progression Note (Signed)
 Transition of Care [facility]) - Progression Note    Patient Details  Name: Russell Padilla MRN: [id number] Date of Birth: 1956-04-08  [facility] of [facility]) CM/SW Contact  Nola Devere Hands, RN Phone Number: 04/02/2024, 11:28 AM  Clinical Narrative:    [facility] Team continuing to follow, no needs identified at this time.    Expected Discharge Plan: Home/Self Care Barriers to Discharge: Continued Medical Work up  Expected Discharge Plan and Services   Discharge Planning Services: CM Consult   Living arrangements for the past 2 months: Single Family Home                                       Social Determinants of Health (SDOH) Interventions SDOH Screenings   Food Insecurity: No Food Insecurity (03/27/2024)  Housing: Low Risk  (03/27/2024)  Transportation Needs: No Transportation Needs (03/27/2024)  Utilities: Not At Risk (03/27/2024)  Depression (PHQ2-9): Low Risk  (11/29/2023)  Social Connections: Unknown (03/27/2024)  Tobacco Use: Medium Risk (03/26/2024)    Readmission Risk Interventions     No data to display

## 2024-04-02 NOTE — Care Management Important Message (Signed)
 Important Message  Patient Details  Name: Russell Padilla MRN: [id number] Date of Birth: [birth year]   Important Message Given:  Yes - Medicare IM     Claretta Deed 04/02/2024, 3:42 PM

## 2024-04-03 ENCOUNTER — Other Ambulatory Visit (HOSPITAL_COMMUNITY): Payer: Self-pay

## 2024-04-03 DIAGNOSIS — K529 Noninfective gastroenteritis and colitis, unspecified: Secondary | ICD-10-CM | POA: Diagnosis not present

## 2024-04-03 MED ORDER — LOPERAMIDE HCL 2 MG PO CAPS
4.0000 mg | ORAL_CAPSULE | Freq: Four times a day (QID) | ORAL | Status: DC | PRN
  Administered 2024-04-03: 4 mg via ORAL
  Filled 2024-04-03: qty 2

## 2024-04-03 MED ORDER — PREDNISONE 10 MG PO TABS
ORAL_TABLET | ORAL | 0 refills | Status: AC
  Filled 2024-04-03: qty 91, 42d supply, fill #0

## 2024-04-03 NOTE — Discharge Summary (Addendum)
 Russell Padilla FMW:[id number] DOB: 1956-04-07 DOA: 03/26/2024  PCP: Seabron Lenis, MD  Admit date: 03/26/2024  Discharge date: 04/03/2024  Admitted From: Home   Disposition:  Home   Recommendations for Outpatient Follow-up:   Follow up with PCP in 1-2 weeks  PCP Please obtain BMP/CBC, 2 view CXR in 1week,  (see Discharge instructions)   PCP Please follow up on the following pending results:    Home Health: None   Equipment/Devices: None  Consultations: [facility], Urology Discharge Condition: Stable    CODE STATUS: Full    Diet Recommendation: Heart Healthy     Chief Complaint  Patient presents with   Dizziness   Diarrhea     Brief history of present illness from the day of admission and additional interim summary    68 y.o.  male with history of ulcerative colitis on Humira/mesalamine who presented with worsening of diarrhea.  He was found to have hypotension secondary to hypovolemic shock along with AKI.  He was evaluated by GI and underwent colonoscopy which showed pancolitis.     Significant events: 6/24>> admit to [facility]-worsening diarrhea for the past several months 6/26>> colonoscopy with severe pan colitis-IV steroids started 6/27>> continues to have diarrhea-discussed with GI/pharmacy-infliximab infusion planned for this coming Sunday.   Significant studies: 6/03>> L LE Doppler: No  DVT. 6/24>> CT abdomen/pelvis: Diffuse colitis.  7 mm stone in the urinary bladder. 6/24>> CT head: No acute intracranial malady 6/24>> CXR: No PNA   Significant microbiology data: 6/24>> blood culture: No growth   Procedures: 6/26>> colonoscopy: Severe pancolitis-likely ulcerative colitis                                                                   Hospital Course   Worsening diarrhea secondary to severe  ulcerative colitis (pancolitis on colonoscopy on 6/26)  Refractory/severe colitis in spite of being on mesalamine and Humira-has had voluminous diarrhea since 2025-underwent colonoscopy which showed pancolitis-subsequently started on IV steroids on 6/26 with significant improvement, he also received his first Remicade infusion on 03/31/2024, symptoms much improved will be placed on a prolonged oral steroid taper as recommended by patient's gastroenterologist with outpatient [facility] follow-up.  Will request PCP and [facility] to monitor his steroid taper towards the end, for now being discharged on GI recommended steroid taper dose.     Hypovolemic shock Secondary to diarrhea resolved after IV fluids   AKI Due to dehydration from diarrhea resolved   CAD Recent LHC May 2025-no targets for PCI-medical management recommended. Currently without any anginal symptoms   HTN Stable no acute issues   HLD Repatha  at home   7 mm stone in the urinary bladder Supportive care Monitor-outpatient urology follow-up   Multiple nonobstructing bilateral renal calculi, history of possible urethral stricture Had  some subjective hematuria and dysuria for which urology saw and cleared him, underwent CT stone protocol which was essentially unremarkable except for a small bladder stone, patient to follow-up with his primary urologist postdischarge within a week.   Left leg swelling Per patient-this has been attributed to varicose veins Recent outpatient Doppler negative for DVT Supportive care Stable with supportive care.   Class 1 Obesity Estimated body mass index is 34.58, follow-up with PCP    Discharge diagnosis     Principal Problem:   Colitis Active Problems:   Diarrhea    Discharge instructions    Discharge Instructions     Diet - low sodium heart healthy   Complete by: As directed    Discharge instructions   Complete by: As directed    Follow with Primary MD Seabron Lenis, MD in 7  days, also follow-up with your gastroenterologist and urologist within a week of discharge.  Get CBC, CMP, Magnesium  -  checked next visit with your primary MD   Activity: As tolerated with Full fall precautions use walker/cane & assistance as needed  Disposition Home    Diet: Heart Healthy    Special Instructions: If you have smoked or chewed Tobacco  in the last 2 yrs please stop smoking, stop any regular Alcohol  and or any Recreational drug use.  On your next visit with your primary care physician please Get Medicines reviewed and adjusted.  Please request your Prim.MD to go over all Hospital Tests and Procedure/Radiological results at the follow up, please get all Hospital records sent to your Prim MD by signing hospital release before you go home.  If you experience worsening of your admission symptoms, develop shortness of breath, life threatening emergency, suicidal or homicidal thoughts you must seek medical attention immediately by calling 911 or calling your MD immediately  if symptoms less severe.  You Must read complete instructions/literature along with all the possible adverse reactions/side effects for all the Medicines you take and that have been prescribed to you. Take any new Medicines after you have completely understood and accpet all the possible adverse reactions/side effects.   Do not drive when taking Pain medications.  Do not take more than prescribed Pain, Sleep and Anxiety Medications  Wear Seat belts while driving.   Increase activity slowly   Complete by: As directed        Discharge Medications   Allergies as of 04/03/2024       Reactions   Crestor [rosuvastatin Calcium ] Other (See Comments)   Aches   Latex Hives, Rash   When at dentist        Medication List     STOP taking these medications    mesalamine 1.2 g EC tablet Commonly known as: LIALDA       TAKE these medications    acetaminophen  500 MG tablet Commonly known as:  TYLENOL  Take 1,000 mg by mouth every 8 (eight) hours as needed for moderate pain (pain score 4-6).   adalimumab 40 MG/0.8ML Ajkt pen Commonly known as: HUMIRA Inject 40 mg into the skin every 14 (fourteen) days.   cholecalciferol  25 MCG (1000 UNIT) tablet Commonly known as: VITAMIN D3 Take 1,000 Units by mouth daily.   dicyclomine  20 MG tablet Commonly known as: BENTYL  Take 20-40 mg by mouth See admin instructions. Take two tablets by mouth in the monrning and then take 1 tablet by mouth in the evening per patient   fluticasone  50 MCG/ACT nasal spray Commonly known as: FLONASE   Place 1 spray into both nostrils at bedtime as needed for allergies.   furosemide  20 MG tablet Commonly known as: Lasix  Take 1 tablet (20 mg total) by mouth as needed. What changed:  when to take this reasons to take this   gabapentin  300 MG capsule Commonly known as: NEURONTIN  Take 1 tablet PO in the morning and take 2 tablets PO at bedtime. What changed:  how much to take how to take this when to take this additional instructions   ketoconazole 2 % cream Commonly known as: NIZORAL Apply 1 Application topically daily as needed for irritation.   loperamide 2 MG tablet Commonly known as: IMODIUM A-D Take 2 mg by mouth 4 (four) times daily as needed for diarrhea or loose stools.   multivitamin with minerals tablet Take 1 tablet by mouth daily. Centrum Silver   nitroGLYCERIN  0.4 MG SL tablet Commonly known as: Nitrostat  Place 1 tablet (0.4 mg total) under the tongue every 5 (five) minutes as needed for chest pain (up to 3 doses).   olmesartan  20 MG tablet Commonly known as: BENICAR  Take 0.5 tablets (10 mg total) by mouth daily.   ondansetron  4 MG tablet Commonly known as: ZOFRAN  Take 4 mg by mouth 2 (two) times daily as needed for nausea or vomiting.   oxyCODONE -acetaminophen  5-325 MG tablet Commonly known as: PERCOCET/ROXICET Take 1 tablet by mouth every 4 (four) hours as needed for  severe pain (pain score 7-10).   pantoprazole  40 MG tablet Commonly known as: PROTONIX  Take 40 mg by mouth 2 (two) times daily.   predniSONE  10 MG tablet Commonly known as: DELTASONE  Take 4 tablets (40 mg total) by mouth daily with breakfast for 7 days, THEN 3 tablets (30 mg total) daily with breakfast for 7 days, THEN 2 tablets (20 mg total) daily with breakfast for 14 days, THEN 1 tablet (10 mg total) daily with breakfast for 14 days. Start taking on: April 04, 2024   Repatha  SureClick 140 MG/ML Soaj Generic drug: Evolocumab  Inject 140 mg into the skin every 14 (fourteen) days.   simvastatin  40 MG tablet Commonly known as: ZOCOR  Take 40 mg by mouth at bedtime.   sucralfate  1 GM/10ML suspension Commonly known as: CARAFATE  Take 1 g by mouth 3 (three) times daily as needed (spasms). With Lidocaine  2%  and Mylanta   Super Biotin 5 MG Tabs Generic drug: Biotin Take 1 tablet by mouth daily.   tiZANidine 2 MG tablet Commonly known as: ZANAFLEX Take 2 mg by mouth every 6 (six) hours as needed for muscle spasms.   traMADol 50 MG tablet Commonly known as: ULTRAM Take 50 mg by mouth every 6 (six) hours as needed for moderate pain.   triamcinolone  0.025 % ointment Commonly known as: KENALOG  Apply 1 Application topically 2 (two) times daily as needed (irritation).         Follow-up Information     Seabron Lenis, MD. Schedule an appointment as soon as possible for a visit in 1 week(s).   Specialty: Family Medicine Why: Also follow-up with your gastroenterologist and urologist within a week of discharge. Contact information: [address] [place] [address] [phone]                 Major procedures and Radiology Reports - PLEASE review detailed and final reports thoroughly  -      CT RENAL STONE STUDY Result Date: 04/02/2024 CLINICAL DATA:  Abdominal/-flank pain EXAM: CT ABDOMEN AND PELVIS WITHOUT CONTRAST TECHNIQUE:  Multidetector CT imaging of  the abdomen and pelvis was performed following the standard protocol without IV contrast. RADIATION DOSE REDUCTION: This exam was performed according to the departmental dose-optimization program which includes automated exposure control, adjustment of the mA and/or kV according to patient size and/or use of iterative reconstruction technique. COMPARISON:  03/26/2024. FINDINGS: Lower chest: Mild linear atelectasis/scarring at the bases. Trace right pleural effusion. No acute findings. Hepatobiliary: Small area of focal fatty infiltration adjacent to the falciform ligament. Liver normal in size and otherwise normal in attenuation. Status post cholecystectomy. Intrahepatic biliary air, stable from the prior CT. No bile duct dilation. Pancreas: Unremarkable. No pancreatic ductal dilatation or surrounding inflammatory changes. Spleen: Normal in size without focal abnormality. Adrenals/Urinary Tract: Normal adrenal glands. Kidneys normal in overall size and position. There are bilateral nonobstructing intrarenal stones. No renal masses. No hydronephrosis. Normal ureters. Bladder mildly distended. Dependent 7 mm bladder stone is stable from the prior CT. No bladder wall thickening or mass. Stomach/Bowel: Normal stomach. Small bowel and colon normal in caliber. No wall thickening or inflammation. Small scattered sigmoid colon diverticula. Normal appendix visualized. Vascular/Lymphatic: Aortic atherosclerosis. No aneurysm. No enlarged lymph nodes. Reproductive: Unremarkable. Other: No ascites. Musculoskeletal: No fracture or acute finding. No bone lesion. Stable changes from a previous posterior lumbar spine fusion. IMPRESSION: 1. No acute findings.  No ureteral stone or obstructive uropathy. 2. Multiple bilateral nonobstructing intrarenal stones, stable. 7 mm dependent bladder stone, also unchanged from the prior CT. 3. Aortic atherosclerosis. Electronically Signed   By: Alm Parkins M.D.   On: 04/02/2024 10:57   CT  ABDOMEN PELVIS WO CONTRAST Result Date: 03/26/2024 CLINICAL DATA:  Sepsis. EXAM: CT ABDOMEN AND PELVIS WITHOUT CONTRAST TECHNIQUE: Multidetector CT imaging of the abdomen and pelvis was performed following the standard protocol without IV contrast. RADIATION DOSE REDUCTION: This exam was performed according to the departmental dose-optimization program which includes automated exposure control, adjustment of the mA and/or kV according to patient size and/or use of iterative reconstruction technique. COMPARISON:  CT abdomen pelvis dated 08/24/2015. FINDINGS: Evaluation of this exam is limited in the absence of intravenous contrast. Lower chest: Minimal bibasilar subpleural atelectasis. A 7 mm para fissural lymph node versus nodule along the right major fissure. Coronary vascular calcification. No intra-abdominal free air or free fluid. Hepatobiliary: Fatty liver. Cholecystectomy and pneumobilia. No retained calcified stone noted in the central CBD. Pancreas: Unremarkable. No pancreatic ductal dilatation or surrounding inflammatory changes. Spleen: Normal in size without focal abnormality. Adrenals/Urinary Tract: The adrenal glands unremarkable. Multiple nonobstructing bilateral renal calculi measure up to 5 mm. No hydronephrosis or obstructing stone. The visualized ureters appear unremarkable. The urinary bladder is minimally distended. There is a 7 mm stone in the urinary bladder. Stomach/Bowel: Diffuse thickened appearance of the colon suspicious of colitis. There is no bowel obstruction. The appendix is normal. Vascular/Lymphatic: Moderate aortoiliac atherosclerotic disease. The IVC is unremarkable. No portal venous gas. There is no adenopathy. Reproductive: The prostate and seminal vesicles are grossly remarkable. Other: Small fat containing bilateral inguinal hernias. Partially visualized bilateral hydroceles. Musculoskeletal: Degenerative changes of the spine and lower lumbar fusion. No acute osseous  pathology. IMPRESSION: 1. Diffuse thickened appearance of the colon suspicious of colitis. No bowel obstruction. Normal appendix. 2. Multiple nonobstructing bilateral renal calculi. No hydronephrosis or obstructing stone. 3. A 7 mm stone in the urinary bladder. 4. Fatty liver. 5.  Aortic Atherosclerosis (ICD10-I70.0). Electronically Signed   By: Vanetta Chou M.D.   On: 03/26/2024 16:09  CT Head Wo Contrast Result Date: 03/26/2024 CLINICAL DATA:  Headache. Increasing frequency or severity. Dizziness. Difficulty walking. EXAM: CT HEAD WITHOUT CONTRAST TECHNIQUE: Contiguous axial images were obtained from the base of the skull through the vertex without intravenous contrast. RADIATION DOSE REDUCTION: This exam was performed according to the departmental dose-optimization program which includes automated exposure control, adjustment of the mA and/or kV according to patient size and/or use of iterative reconstruction technique. COMPARISON:  None Available. FINDINGS: Brain: The brain shows a normal appearance without evidence of malformation, atrophy, old or acute small or large vessel infarction, mass lesion, hemorrhage, hydrocephalus or extra-axial collection. Vascular: No hyperdense vessel. No evidence of atherosclerotic calcification. Skull: Normal.  No traumatic finding.  No focal bone lesion. Sinuses/Orbits: Sinuses are clear. Orbits appear normal. Mastoids are clear. Other: None significant IMPRESSION: Normal head CT. Electronically Signed   By: Oneil Officer M.D.   On: 03/26/2024 16:07   DG Chest Port 1 View Result Date: 03/26/2024 CLINICAL DATA:  Questionable sepsis - evaluate for abnormality EXAM: PORTABLE CHEST - 1 VIEW COMPARISON:  None available. FINDINGS: No focal airspace consolidation, pleural effusion, or pneumothorax. No cardiomegaly. No acute fracture or destructive lesion. Partially visualized cervical fusion hardware again noted. IMPRESSION: No acute cardiopulmonary abnormality.  Electronically Signed   By: Rogelia Myers M.D.   On: 03/26/2024 14:25   US  Venous Img Lower Unilateral Left (DVT) Result Date: 03/05/2024 CLINICAL DATA:  68 year old male with left lower extremity pain and swelling. EXAM: LEFT LOWER EXTREMITY VENOUS DOPPLER ULTRASOUND TECHNIQUE: Gray-scale sonography with graded compression, as well as color Doppler and duplex ultrasound were performed to evaluate the left lower extremity deep venous systems from the level of the common femoral vein and including the common femoral, femoral, profunda femoral, popliteal and calf veins including the posterior tibial, peroneal and gastrocnemius veins when visible. Spectral Doppler was utilized to evaluate flow at rest and with distal augmentation maneuvers in the common femoral, femoral and popliteal veins. The contralateral common femoral vein was also evaluated for comparison. COMPARISON:  07/19/2023 FINDINGS: LEFT LOWER EXTREMITY Common Femoral Vein: No evidence of thrombus. Normal compressibility, respiratory phasicity and response to augmentation. Central Greater Saphenous Vein: No evidence of thrombus. Normal compressibility and flow on color Doppler imaging. Central Profunda Femoral Vein: No evidence of thrombus. Normal compressibility and flow on color Doppler imaging. Femoral Vein: No evidence of thrombus. Normal compressibility, respiratory phasicity and response to augmentation. Popliteal Vein: No evidence of thrombus. Normal compressibility, respiratory phasicity and response to augmentation. Calf Veins: No evidence of thrombus. Normal compressibility and flow on color Doppler imaging. Other Findings:  None. RIGHT LOWER EXTREMITY Common Femoral Vein: No evidence of thrombus. Normal compressibility, respiratory phasicity and response to augmentation. IMPRESSION: No evidence of left lower extremity deep venous thrombosis. Ester Sides, MD [facility]  Electronically Signed   By: Ester Sides M.D.   On: 03/05/2024 15:49    Micro Results    Recent Results (from the past 240 hours)  Blood Culture (routine x 2)     Status: None   Collection Time: 03/26/24  1:53 PM   Specimen: BLOOD RIGHT ARM  Result Value Ref Range Status   Specimen Description BLOOD RIGHT ARM  Final   Special Requests   Final    BOTTLES DRAWN AEROBIC AND ANAEROBIC Blood Culture results may not be optimal due to an inadequate volume of blood received in culture bottles   Culture   Final  NO GROWTH 5 DAYS Performed at [facility], [address] [place], [place] [address]    Report Status 03/31/2024 FINAL  Final  Blood Culture (routine x 2)     Status: None   Collection Time: 03/26/24  1:58 PM   Specimen: BLOOD RIGHT HAND  Result Value Ref Range Status   Specimen Description BLOOD RIGHT HAND  Final   Special Requests   Final    BOTTLES DRAWN AEROBIC ONLY Blood Culture results may not be optimal due to an inadequate volume of blood received in culture bottles   Culture   Final    NO GROWTH 5 DAYS Performed at [facility], [address] [place], [place] [address]    Report Status 03/31/2024 FINAL  Final    Today   Subjective    Charlie Mania today has no headache,no chest abdominal pain,no new weakness tingling or numbness, feels much better wants to go home today.    Objective   Blood pressure 132/74, pulse (!) 58, temperature 98.2 F (36.8 C), temperature source Oral, resp. rate 18, height 5' 10 (1.778 m), weight 109.3 kg, SpO2 97%.   Intake/Output Summary (Last 24 hours) at 04/03/2024 1032 Last data filed at 04/02/2024 1600 Gross per 24 hour  Intake 720 ml  Output --  Net 720 ml    Exam  Awake Alert, No new F.N deficits,    [place].AT,PERRAL Supple Neck,   Symmetrical Chest wall movement, Good air movement bilaterally, CTAB RRR,No Gallops,   +ve B.Sounds, Abd Soft, Non tender,  No Cyanosis, Clubbing or edema    Data Review    Recent Labs  Lab 03/29/24 0735 03/30/24 0742 03/31/24 0815 04/01/24 0605 04/02/24 0534  WBC 4.9 7.2 8.3 9.4 7.7  HGB 11.7* 11.6* 11.1* 11.5* 11.0*  HCT 37.0* 37.5* 35.6* 36.7* 35.0*  PLT 278 272 242 255 214  MCV 86.2 86.6 86.0 86.8 85.8  MCH 27.3 26.8 26.8 27.2 27.0  MCHC 31.6 30.9 31.2 31.3 31.4  RDW 16.2* 16.5* 16.5* 16.8* 16.8*    Recent Labs  Lab 03/29/24 0735 03/30/24 0742 03/31/24 0815 04/01/24 0605 04/02/24 0534  NA 136 141 138 137 137  K 4.2 4.2 4.9 4.4 4.1  CL 104 107 105 103 105  CO2 24 26 26 28 27   ANIONGAP 8 8 7 6 5   GLUCOSE 128* 150* 138* 132* 151*  BUN <5* <5* 8 14 14   CREATININE 1.29* 1.04 1.13 1.15 1.08  MG 1.9 2.0 2.0 2.0 2.0  CALCIUM  8.3* 8.5* 8.4* 8.5* 8.3*    Total Time in preparing paper work, data evaluation and todays exam - 35 minutes  Signature  -    Lavada Stank M.D on 04/03/2024 at 10:32 AM   -  To page go to [email or web address]

## 2024-04-03 NOTE — Plan of Care (Signed)
  Problem: Education: Goal: Knowledge of General Education information will improve Description: Including pain rating scale, medication(s)/side effects and non-pharmacologic comfort measures Outcome: Progressing   Problem: Health Behavior/Discharge Planning: Goal: Ability to manage health-related needs will improve Outcome: Progressing   Problem: Clinical Measurements: Goal: Will remain free from infection Outcome: Progressing   Problem: Activity: Goal: Risk for activity intolerance will decrease Outcome: Progressing   Problem: Nutrition: Goal: Adequate nutrition will be maintained Outcome: Progressing   Problem: Safety: Goal: Ability to remain free from injury will improve Outcome: Progressing   Problem: Skin Integrity: Goal: Risk for impaired skin integrity will decrease Outcome: Progressing   

## 2024-04-03 NOTE — Discharge Instructions (Signed)
 Follow with Primary MD Seabron Lenis, MD in 7 days, also follow-up with your gastroenterologist and urologist within a week of discharge.  Get CBC, CMP, Magnesium  -  checked next visit with your primary MD   Activity: As tolerated with Full fall precautions use walker/cane & assistance as needed  Disposition Home    Diet: Heart Healthy    Special Instructions: If you have smoked or chewed Tobacco  in the last 2 yrs please stop smoking, stop any regular Alcohol  and or any Recreational drug use.  On your next visit with your primary care physician please Get Medicines reviewed and adjusted.  Please request your Prim.MD to go over all Hospital Tests and Procedure/Radiological results at the follow up, please get all Hospital records sent to your Prim MD by signing hospital release before you go home.  If you experience worsening of your admission symptoms, develop shortness of breath, life threatening emergency, suicidal or homicidal thoughts you must seek medical attention immediately by calling 911 or calling your MD immediately  if symptoms less severe.  You Must read complete instructions/literature along with all the possible adverse reactions/side effects for all the Medicines you take and that have been prescribed to you. Take any new Medicines after you have completely understood and accpet all the possible adverse reactions/side effects.   Do not drive when taking Pain medications.  Do not take more than prescribed Pain, Sleep and Anxiety Medications  Wear Seat belts while driving.

## 2024-04-03 NOTE — Progress Notes (Signed)
 No evidence obstructing stone or recent passage of one. Small bladder stone and intrarenal stones to be addressed with his regular urologist outpt.

## 2024-04-28 NOTE — Progress Notes (Unsigned)
 [facility] OFFICE PROGRESS NOTE  Patient Care Team: Seabron Lenis, MD as PCP - General (Family Medicine) Verlin Lonni BIRCH, MD as PCP - Cardiology (Cardiology)  Visit type: video and audio Patient location: home in [place] Physician location: [facility]  68 y.o. male with history of CAD with stent, hypertension, hyperlipidemia, IBD, dysphagia being seen for iron deficiency anemia.   IDA with history of IBD. Recent admission with pancolitis. MCV still low normal.  He still has symptoms of diarrhea. Report still having struggles with symptoms of IBD.   I reviewed new labs from outside records. Records dated 04/22/24 show ferritin of 25, hemoglobin of 11.9 MCV 90 B12 371.  Discussed results today and discussed ordering IV iron and start B12 supplement.  If no response, will switch to B12 injection in the future.    Relevant history: IBD Last colonoscopy: 2024 Last EGD: 2024   Will fax lab to [facility] on [address] in [place]. Will get lab a week before visit. Video ok. Assessment & Plan Other iron deficiency anemia  Lab today. CBC, ferritin, and b12  Repeat IV feraheme as needed Follow up in about 6 months, lab about a week before next visit in Feb 2026 Low serum vitamin B12 B12 1000 mcg daily Repeat lab in 6 months  Orders Placed This Encounter  Procedures   CBC with Differential (Cancer Center Only)    Standing Status:   Future    Expiration Date:   04/29/2025   Vitamin B12    Standing Status:   Future    Expiration Date:   04/29/2025   Ferritin    Standing Status:   Future    Expiration Date:   04/29/2025   I spent a total of 30 minutes including review of chart and various tests results, face-to-face time with the patient, discussions about results, plan of care and coordination of care plan with other providers and staff members.    Pauletta JAYSON Chihuahua, MD  INTERVAL HISTORY: Patient being follow-up through televisit at his request due to  need for frequent bowel movement from IBD.  Last Feraheme in 11/2023. Repeat labs on 01/22/24 showed hgb 11.9. MCV 86. Iron saturation of 13%. Ferritin 99. B12 was 507 and folate >20.  Recently inpatient for about a week. Report pancolitis. He was started Remicade  and now s/p 2 doses.  He started started on steroid as well now on prednisone  20 mg daily. He still has diarrhea. Stool has been light. No bloody stool or other bleeding.  He had lab on Monday of last week.   PHYSICAL EXAMINATION:  No distress on screening.  Relevant data reviewed during this visit included outside records.

## 2024-04-28 NOTE — Assessment & Plan Note (Signed)
  Lab today. CBC, ferritin, and b12  Repeat IV feraheme as needed Follow up in about 6 months, lab about a week before next visit in Feb 2026

## 2024-04-29 ENCOUNTER — Inpatient Hospital Stay

## 2024-04-29 ENCOUNTER — Telehealth: Payer: Self-pay | Admitting: *Deleted

## 2024-04-29 DIAGNOSIS — E538 Deficiency of other specified B group vitamins: Secondary | ICD-10-CM | POA: Diagnosis not present

## 2024-04-29 DIAGNOSIS — D508 Other iron deficiency anemias: Secondary | ICD-10-CM

## 2024-04-29 NOTE — Telephone Encounter (Signed)
 Lab orders for CBC, B-12, and Ferritin were faxed to [facility] at [phone]. Receipt of confirmation received.

## 2024-04-30 ENCOUNTER — Telehealth: Payer: Self-pay

## 2024-04-30 NOTE — Telephone Encounter (Signed)
 Dr. Tina, patient will be scheduled as soon as possible.  Auth Submission: NO AUTH NEEDED Site of care: Site of care: [facility] Payer: [employer] medicare Medication & CPT/J Code(s) submitted: Feraheme (ferumoxytol ) [id number] Diagnosis Code:  Route of submission (phone, fax, portal):  Phone # Fax # Auth type: Buy/Bill PB Units/visits requested: 510mg  x 2 doses Reference number:  Approval from: 04/30/24 to 08/31/24

## 2024-05-03 ENCOUNTER — Ambulatory Visit

## 2024-05-03 VITALS — BP 132/78 | HR 54 | Temp 98.0°F | Resp 20 | Ht 70.5 in | Wt 227.6 lb

## 2024-05-03 DIAGNOSIS — D500 Iron deficiency anemia secondary to blood loss (chronic): Secondary | ICD-10-CM

## 2024-05-03 DIAGNOSIS — K529 Noninfective gastroenteritis and colitis, unspecified: Secondary | ICD-10-CM

## 2024-05-03 MED ORDER — SODIUM CHLORIDE 0.9 % IV SOLN
510.0000 mg | Freq: Once | INTRAVENOUS | Status: AC
  Administered 2024-05-03: 510 mg via INTRAVENOUS
  Filled 2024-05-03: qty 17

## 2024-05-03 NOTE — Progress Notes (Signed)
 Diagnosis: Iron Deficiency Anemia  Provider:  Mannam, Praveen MD  Procedure: IV Infusion  IV Type: Peripheral, IV Location: L Antecubital  Feraheme (Ferumoxytol ), Dose: 510 mg  Infusion Start Time: 1608  Infusion Stop Time: 1625  Post Infusion IV Care: Patient declined observation and Peripheral IV Discontinued  Discharge: Condition: Good, Destination: Home . AVS Declined  Performed by:  Rocky FORBES Sar, RN

## 2024-05-06 ENCOUNTER — Encounter: Payer: Self-pay | Admitting: Family Medicine

## 2024-05-10 ENCOUNTER — Ambulatory Visit

## 2024-05-10 VITALS — BP 128/80 | HR 56 | Temp 97.9°F | Resp 16 | Ht 70.0 in | Wt 230.4 lb

## 2024-05-10 DIAGNOSIS — D500 Iron deficiency anemia secondary to blood loss (chronic): Secondary | ICD-10-CM

## 2024-05-10 DIAGNOSIS — K529 Noninfective gastroenteritis and colitis, unspecified: Secondary | ICD-10-CM

## 2024-05-10 MED ORDER — SODIUM CHLORIDE 0.9 % IV SOLN
510.0000 mg | Freq: Once | INTRAVENOUS | Status: AC
  Administered 2024-05-10: 510 mg via INTRAVENOUS
  Filled 2024-05-10: qty 17

## 2024-05-10 NOTE — Progress Notes (Signed)
 Diagnosis: Iron Deficiency Anemia  Provider:  Praveen Mannam MD  Procedure: IV Infusion  IV Type: Peripheral, IV Location: L Antecubital  Feraheme (Ferumoxytol ), Dose: 510 mg  Infusion Start Time: 1531  Infusion Stop Time: 1551  Post Infusion IV Care: Patient declined observation and Peripheral IV Discontinued  Discharge: Condition: Good, Destination: Home . AVS Provided  Performed by:  Charlesetta Milliron, RN

## 2024-06-17 ENCOUNTER — Encounter: Payer: Self-pay | Admitting: Surgery

## 2024-06-17 ENCOUNTER — Ambulatory Visit: Attending: Surgery | Admitting: Surgery

## 2024-06-17 VITALS — BP 147/81 | HR 61 | Temp 98.3°F | Ht 70.0 in | Wt 236.0 lb

## 2024-06-17 DIAGNOSIS — I872 Venous insufficiency (chronic) (peripheral): Secondary | ICD-10-CM

## 2024-06-17 NOTE — Progress Notes (Signed)
 [facility]  Patient name: Russell Padilla MRN: [id number] DOB: 1955/12/04 Sex: male   REASON FOR VISIT:    Follow-up varicose vein  HISOTRY OF PRESENT ILLNESS:    Russell Padilla is a 68 y.o. male who has been evaluated by Dr. Lanis for bilateral leg pain.  He is a retired Emergency planning/management officer who recently lost his wife due to Parkinson's and dementia.  While taking care of her he did not take care of himself and began noticing lower extremity edema which did improve however following a bad episode of IBD requiring high-dose steroids, he had significant anasarca.  He has started on Remicaid to wean off of his steroids.  He was encouraged to wear compression stockings.  His swelling has improved.  He has tried Lasix  occasionally.   PAST MEDICAL HISTORY:   Past Medical History:  Diagnosis Date   Abdominal pain 07/11/2013   ongoing and unspecified   Arthritis    Bladder stone 07/11/2013   surgery planned   CAD (coronary artery disease)    a. 12/2012 Cath/PCI: DES to superior branch of OM2;  b. Repeat cath 4/14 with 20% LAD, patent stent OM3, 70% diffuse stenosis small, non-dominant RCA.   Essential hypertension    Headache    HLD (hyperlipidemia)    Renal disorder    renal calculi   Stomach pain    chronic     FAMILY HISTORY:   Family History  Problem Relation Age of Onset   Stomach cancer Mother    Congestive Heart Failure Father    Dementia Father    Heart disease Brother     SOCIAL HISTORY:   Social History   Tobacco Use   Smoking status: Former    Current packs/day: 0.00    Average packs/day: 0.5 packs/day for 30.0 years (15.0 ttl pk-yrs)    Types: Cigarettes    Start date: 12/05/1982    Quit date: 12/04/2012    Years since quitting: 11.5   Smokeless tobacco: Never  Substance Use Topics   Alcohol use: No    Alcohol/week: 0.0 standard drinks of alcohol     ALLERGIES:   Allergies  Allergen  Reactions   Crestor [Rosuvastatin Calcium ] Other (See Comments)    Aches    Latex Hives and Rash    When at dentist     CURRENT MEDICATIONS:   Current Outpatient Medications  Medication Sig Dispense Refill   acetaminophen  (TYLENOL ) 500 MG tablet Take 1,000 mg by mouth every 8 (eight) hours as needed for moderate pain (pain score 4-6).     adalimumab (HUMIRA) 40 MG/0.8ML AJKT pen Inject 40 mg into the skin every 14 (fourteen) days.     Biotin (SUPER BIOTIN) 5 MG TABS Take 1 tablet by mouth daily.     cholecalciferol  (VITAMIN D3) 25 MCG (1000 UNIT) tablet Take 1,000 Units by mouth daily.     dicyclomine  (BENTYL ) 20 MG tablet Take 20-40 mg by mouth See admin instructions. Take two tablets by mouth in the monrning and then take 1 tablet by mouth in the evening per patient     Evolocumab  (REPATHA  SURECLICK) 140 MG/ML SOAJ Inject 140 mg into the skin every 14 (fourteen) days. 6 mL 3   fluticasone  (FLONASE ) 50 MCG/ACT nasal spray Place 1 spray into both nostrils at bedtime as needed for allergies.     furosemide  (LASIX ) 20 MG tablet Take 1 tablet (20 mg total) by mouth as needed. (Patient taking differently:  Take 20 mg by mouth daily as needed for fluid.) 30 tablet 6   gabapentin  (NEURONTIN ) 300 MG capsule Take 1 tablet PO in the morning and take 2 tablets PO at bedtime. (Patient taking differently: Take 300-600 mg by mouth See admin instructions. Take one capsule by mouth in the morning and then take 2 capsules by mouth at bedtime per patient) 270 capsule 3   ketoconazole (NIZORAL) 2 % cream Apply 1 Application topically daily as needed for irritation.     loperamide  (IMODIUM  A-D) 2 MG tablet Take 2 mg by mouth 4 (four) times daily as needed for diarrhea or loose stools.     Multiple Vitamins-Minerals (MULTIVITAMIN WITH MINERALS) tablet Take 1 tablet by mouth daily. Centrum Silver     nitroGLYCERIN  (NITROSTAT ) 0.4 MG SL tablet Place 1 tablet (0.4 mg total) under the tongue every 5 (five) minutes  as needed for chest pain (up to 3 doses). 25 tablet 5   olmesartan  (BENICAR ) 20 MG tablet Take 0.5 tablets (10 mg total) by mouth daily. 45 tablet 2   ondansetron  (ZOFRAN ) 4 MG tablet Take 4 mg by mouth 2 (two) times daily as needed for nausea or vomiting.     oxyCODONE -acetaminophen  (PERCOCET/ROXICET) 5-325 MG per tablet Take 1 tablet by mouth every 4 (four) hours as needed for severe pain (pain score 7-10).     pantoprazole  (PROTONIX ) 40 MG tablet Take 40 mg by mouth 2 (two) times daily.      predniSONE  (DELTASONE ) 10 MG tablet Take 10 mg by mouth daily.     simvastatin  (ZOCOR ) 40 MG tablet Take 40 mg by mouth at bedtime.     sucralfate  (CARAFATE ) 1 GM/10ML suspension Take 1 g by mouth 3 (three) times daily as needed (spasms). With Lidocaine  2%  and Mylanta     tiZANidine (ZANAFLEX) 2 MG tablet Take 2 mg by mouth every 6 (six) hours as needed for muscle spasms.     traMADol (ULTRAM) 50 MG tablet Take 50 mg by mouth every 6 (six) hours as needed for moderate pain.     triamcinolone  (KENALOG ) 0.025 % ointment Apply 1 Application topically 2 (two) times daily as needed (irritation).     No current facility-administered medications for this visit.    REVIEW OF SYSTEMS:   [X]  denotes positive finding, [ ]  denotes negative finding Cardiac  Comments:  Chest pain or chest pressure:    Shortness of breath upon exertion:    Short of breath when lying flat:    Irregular heart rhythm:        Vascular    Pain in calf, thigh, or hip brought on by ambulation:    Pain in feet at night that wakes you up from your sleep:     Blood clot in your veins:    Leg swelling:  x       Pulmonary    Oxygen at home:    Productive cough:     Wheezing:         Neurologic    Sudden weakness in arms or legs:     Sudden numbness in arms or legs:     Sudden onset of difficulty speaking or slurred speech:    Temporary loss of vision in one eye:     Problems with dizziness:         Gastrointestinal    Blood  in stool:     Vomited blood:         Genitourinary  Burning when urinating:     Blood in urine:        Psychiatric    Major depression:         Hematologic    Bleeding problems:    Problems with blood clotting too easily:        Skin    Rashes or ulcers:        Constitutional    Fever or chills:      PHYSICAL EXAM:   Vitals:   06/17/24 1417  BP: (!) 147/81  Pulse: 61  Temp: 98.3 F (36.8 C)  SpO2: 91%  Weight: 236 lb (107 kg)  Height: 5' 10 (1.778 m)    GENERAL: The patient is a well-nourished male, in no acute distress. The vital signs are documented above. CARDIAC: There is a regular rate and rhythm.  VASCULAR: Palpable pedal pulses.  SonoSite was used to evaluate left saphenous vein which measures 4 4 mm PULMONARY: Non-labored respirations MUSCULOSKELETAL: There are no major deformities or cyanosis. NEUROLOGIC: No focal weakness or paresthesias are detected. SKIN: There are no ulcers or rashes noted. PSYCHIATRIC: The patient has a normal affect.  STUDIES:   I have reviewed the following reflux study:  +--------------+---------+------+-----------+------------+--------+  LEFT         Reflux NoRefluxReflux TimeDiameter cmsComments                          Yes                                   +--------------+---------+------+-----------+------------+--------+  CFV                    yes   >1 second                       +--------------+---------+------+-----------+------------+--------+  FV mid                  yes   >1 second                       +--------------+---------+------+-----------+------------+--------+  GSV at SFJ              yes    >500 ms      0.70              +--------------+---------+------+-----------+------------+--------+  GSV prox thigh          yes    >500 ms      0.20              +--------------+---------+------+-----------+------------+--------+  GSV mid thigh no                             0.30              +--------------+---------+------+-----------+------------+--------+  GSV dist thighno                            0.35              +--------------+---------+------+-----------+------------+--------+  GSV at knee             yes    >500 ms      0.20              +--------------+---------+------+-----------+------------+--------+  GSV prox calf           yes    >500 ms      0.20              +--------------+---------+------+-----------+------------+--------+  GSV mid calf            yes    >500 ms      0.20              +--------------+---------+------+-----------+------------+--------+  SSV prox calf no                            0.20              +--------------+---------+------+-----------+------------+--------+    MEDICAL ISSUES:   CEAP class III disease, left leg: The patient is currently being treated for ulcerative colitis.  He is transition to Remicade .  He is down to 10 mg of prednisone .  He still having issues with bowel movements.  His swelling has improved.  After looking at his saphenous vein he does have reflux but the diameters are on the small side for laser ablation.  I suspect his overall systemic inflammation is contributing to his edema.  At this time I would not recommend endovenous laser ablation but rather continued use of 20-30 compression stockings along with elevation, and exercise if possible.  I would like for him to return in 1 year with repeat imaging to see if there has been any change in the diameter of the saphenous vein.  If it has gotten worse and he still has significant symptoms, we could consider ablation.    Malvina Serene CLORE, MD, FACS [facility] [phone] Pager [phone]

## 2024-09-13 ENCOUNTER — Other Ambulatory Visit (HOSPITAL_COMMUNITY): Payer: Self-pay | Admitting: Family Medicine

## 2024-09-13 DIAGNOSIS — Z87891 Personal history of nicotine dependence: Secondary | ICD-10-CM

## 2024-09-13 DIAGNOSIS — Z136 Encounter for screening for cardiovascular disorders: Secondary | ICD-10-CM

## 2024-09-18 ENCOUNTER — Other Ambulatory Visit (HOSPITAL_COMMUNITY): Payer: Self-pay | Admitting: Family Medicine

## 2024-09-18 ENCOUNTER — Other Ambulatory Visit: Payer: Self-pay

## 2024-09-18 DIAGNOSIS — I700 Atherosclerosis of aorta: Secondary | ICD-10-CM

## 2024-09-18 DIAGNOSIS — Z87891 Personal history of nicotine dependence: Secondary | ICD-10-CM

## 2024-09-18 DIAGNOSIS — Z136 Encounter for screening for cardiovascular disorders: Secondary | ICD-10-CM

## 2024-09-18 DIAGNOSIS — Z122 Encounter for screening for malignant neoplasm of respiratory organs: Secondary | ICD-10-CM

## 2024-09-19 ENCOUNTER — Ambulatory Visit

## 2024-09-23 ENCOUNTER — Other Ambulatory Visit (HOSPITAL_COMMUNITY): Payer: Self-pay | Admitting: Radiology

## 2024-09-27 ENCOUNTER — Ambulatory Visit (HOSPITAL_COMMUNITY)
Admission: RE | Admit: 2024-09-27 | Discharge: 2024-09-27 | Disposition: A | Discharge status: Home / Self Care | Source: Ambulatory Visit | Attending: Family Medicine | Admitting: Family Medicine

## 2024-09-27 ENCOUNTER — Other Ambulatory Visit (HOSPITAL_COMMUNITY): Payer: Self-pay | Admitting: Family Medicine

## 2024-09-27 DIAGNOSIS — I700 Atherosclerosis of aorta: Secondary | ICD-10-CM | POA: Diagnosis present

## 2024-09-27 DIAGNOSIS — Z136 Encounter for screening for cardiovascular disorders: Secondary | ICD-10-CM

## 2024-09-27 DIAGNOSIS — I2510 Atherosclerotic heart disease of native coronary artery without angina pectoris: Secondary | ICD-10-CM | POA: Insufficient documentation

## 2024-09-27 DIAGNOSIS — E785 Hyperlipidemia, unspecified: Secondary | ICD-10-CM | POA: Diagnosis not present

## 2024-09-27 DIAGNOSIS — Z122 Encounter for screening for malignant neoplasm of respiratory organs: Secondary | ICD-10-CM | POA: Diagnosis present

## 2024-09-27 DIAGNOSIS — N200 Calculus of kidney: Secondary | ICD-10-CM | POA: Diagnosis not present

## 2024-09-27 DIAGNOSIS — Z87891 Personal history of nicotine dependence: Secondary | ICD-10-CM

## 2024-09-27 DIAGNOSIS — J439 Emphysema, unspecified: Secondary | ICD-10-CM | POA: Insufficient documentation

## 2024-09-27 DIAGNOSIS — I10 Essential (primary) hypertension: Secondary | ICD-10-CM | POA: Insufficient documentation

## 2024-10-26 ENCOUNTER — Other Ambulatory Visit: Payer: Self-pay | Admitting: Cardiovascular Disease
